# Patient Record
Sex: Female | Born: 1935 | Race: White | Hispanic: No | Smoking: Former smoker
Health system: Southern US, Community
[De-identification: ages and names within clinical notes are randomized; demographics above are authoritative.]

## PROBLEM LIST (undated history)

## (undated) DIAGNOSIS — J45909 Unspecified asthma, uncomplicated: Secondary | ICD-10-CM

## (undated) DIAGNOSIS — K5730 Diverticulosis of large intestine without perforation or abscess without bleeding: Secondary | ICD-10-CM

## (undated) DIAGNOSIS — Z860101 Personal history of adenomatous and serrated colon polyps: Secondary | ICD-10-CM

## (undated) DIAGNOSIS — Z8719 Personal history of other diseases of the digestive system: Secondary | ICD-10-CM

## (undated) DIAGNOSIS — I10 Essential (primary) hypertension: Secondary | ICD-10-CM

## (undated) DIAGNOSIS — E039 Hypothyroidism, unspecified: Secondary | ICD-10-CM

## (undated) DIAGNOSIS — Z86010 Personal history of colonic polyps: Secondary | ICD-10-CM

## (undated) DIAGNOSIS — K31819 Angiodysplasia of stomach and duodenum without bleeding: Secondary | ICD-10-CM

## (undated) DIAGNOSIS — Z9889 Other specified postprocedural states: Secondary | ICD-10-CM

## (undated) DIAGNOSIS — F418 Other specified anxiety disorders: Secondary | ICD-10-CM

## (undated) DIAGNOSIS — E7800 Pure hypercholesterolemia, unspecified: Secondary | ICD-10-CM

## (undated) DIAGNOSIS — N189 Chronic kidney disease, unspecified: Secondary | ICD-10-CM

## (undated) DIAGNOSIS — K2270 Barrett's esophagus without dysplasia: Secondary | ICD-10-CM

## (undated) DIAGNOSIS — J189 Pneumonia, unspecified organism: Secondary | ICD-10-CM

## (undated) DIAGNOSIS — K219 Gastro-esophageal reflux disease without esophagitis: Secondary | ICD-10-CM

## (undated) DIAGNOSIS — K449 Diaphragmatic hernia without obstruction or gangrene: Secondary | ICD-10-CM

## (undated) DIAGNOSIS — K8020 Calculus of gallbladder without cholecystitis without obstruction: Secondary | ICD-10-CM

## (undated) DIAGNOSIS — R627 Adult failure to thrive: Secondary | ICD-10-CM

## (undated) DIAGNOSIS — M1990 Unspecified osteoarthritis, unspecified site: Secondary | ICD-10-CM

## (undated) DIAGNOSIS — J449 Chronic obstructive pulmonary disease, unspecified: Secondary | ICD-10-CM

## (undated) HISTORY — DX: Other specified postprocedural states: Z98.890

## (undated) HISTORY — PX: BLADDER SUSPENSION: SHX72

## (undated) HISTORY — PX: ABDOMINAL HYSTERECTOMY: SHX81

## (undated) HISTORY — DX: Calculus of gallbladder without cholecystitis without obstruction: K80.20

## (undated) HISTORY — PX: CHOLECYSTECTOMY: SHX55

## (undated) HISTORY — PX: APPENDECTOMY: SHX54

## (undated) HISTORY — PX: CATARACT EXTRACTION W/ INTRAOCULAR LENS  IMPLANT, BILATERAL: SHX1307

## (undated) HISTORY — DX: Personal history of other diseases of the digestive system: Z87.19

## (undated) HISTORY — PX: NISSEN FUNDOPLICATION: SHX2091

## (undated) HISTORY — DX: Adult failure to thrive: R62.7

---

## 1987-07-26 HISTORY — PX: OTHER SURGICAL HISTORY: SHX169

## 1994-07-25 DIAGNOSIS — K5730 Diverticulosis of large intestine without perforation or abscess without bleeding: Secondary | ICD-10-CM

## 1994-07-25 HISTORY — DX: Diverticulosis of large intestine without perforation or abscess without bleeding: K57.30

## 1998-04-21 ENCOUNTER — Ambulatory Visit (HOSPITAL_COMMUNITY): Admission: RE | Admit: 1998-04-21 | Discharge: 1998-04-21 | Payer: Self-pay | Admitting: Internal Medicine

## 1998-05-30 ENCOUNTER — Emergency Department (HOSPITAL_COMMUNITY): Admission: EM | Admit: 1998-05-30 | Discharge: 1998-05-30 | Payer: Self-pay | Admitting: Emergency Medicine

## 2000-04-03 ENCOUNTER — Other Ambulatory Visit: Admission: RE | Admit: 2000-04-03 | Discharge: 2000-04-03 | Payer: Self-pay | Admitting: *Deleted

## 2000-04-17 ENCOUNTER — Encounter (INDEPENDENT_AMBULATORY_CARE_PROVIDER_SITE_OTHER): Payer: Self-pay | Admitting: Specialist

## 2000-04-17 ENCOUNTER — Other Ambulatory Visit: Admission: RE | Admit: 2000-04-17 | Discharge: 2000-04-17 | Payer: Self-pay | Admitting: *Deleted

## 2001-07-25 HISTORY — PX: ENTEROCELE REPAIR: SHX623

## 2002-05-06 ENCOUNTER — Inpatient Hospital Stay (HOSPITAL_COMMUNITY): Admission: RE | Admit: 2002-05-06 | Discharge: 2002-05-08 | Payer: Self-pay

## 2002-05-06 ENCOUNTER — Encounter (INDEPENDENT_AMBULATORY_CARE_PROVIDER_SITE_OTHER): Payer: Self-pay | Admitting: Specialist

## 2006-05-03 ENCOUNTER — Ambulatory Visit: Payer: Self-pay | Admitting: Internal Medicine

## 2006-05-22 ENCOUNTER — Ambulatory Visit: Payer: Self-pay | Admitting: Internal Medicine

## 2006-05-22 ENCOUNTER — Encounter (INDEPENDENT_AMBULATORY_CARE_PROVIDER_SITE_OTHER): Payer: Self-pay | Admitting: Specialist

## 2007-08-01 ENCOUNTER — Inpatient Hospital Stay (HOSPITAL_COMMUNITY): Admission: EM | Admit: 2007-08-01 | Discharge: 2007-08-04 | Payer: Self-pay | Admitting: Emergency Medicine

## 2010-12-07 NOTE — Discharge Summary (Signed)
Carolyn [name], Carolyn [name]               ACCOUNT NO.:  [id number]   MEDICAL RECORD NO.:  [id number]          PATIENT TYPE:  INP   LOCATION:  4707                         FACILITY:  [facility]   PHYSICIAN:  Mobolaji B. Bakare, M.D.DATE OF BIRTH:  1931-07-15   DATE OF ADMISSION:  07/31/2007  DATE OF DISCHARGE:  05/03/2008                               DISCHARGE SUMMARY   PRIMARY CARE PHYSICIAN:  [place] with [facility].   FINAL DIAGNOSES:  1. Klebsiella urinary tract infection.  2. Hypotension, felt secondary to dehydration.  3. Presyncope secondary to hypotension.  4. Renal insufficiency secondary to dehydration.  5. Mild normocytic anemia.   PROCEDURES:  1. Head CT scan done on July 31, 2007 showed atrophic microvascular      ischemic changes.  2. Chest x-ray done on July 31, 2007 showed bibasilar interstitial      pulmonary disease and atelectasis.  No definite acute CHF or      pneumonia.   BRIEF HISTORY:  Carolyn [name] is a 77 year old Caucasian female who was  brought to the emergency room after being evaluated at the primary care  physician's office on the day of admission.  She was reportedly to have  felt dizzy and almost passed out at work.  She was sent to the emergency  room for further evaluation.  Upon arrival, blood pressure was 82/59.  She had no chest pain or shortness of breath.  She had a normal sinus  EKG without acute ST changes.  Temperature was normal.  Urinalysis was  suggestive of urinary tract infection.  Patient was admitted for further  evaluation.   HOSPITAL COURSE:  1. Klebsiella urinary tract infection:  Carolyn [name] was empirically      started on ciprofloxacin.  She was hydrated aggressively.  Blood      pressure improved within 24 hours of hospitalization.  She was      previously on Avalide.  This was temporarily held.  Blood pressure      became hypertensive on the day of admission; hence,      antihypertensive was restarted.   Urine culture grew Klebsiella,      which was pan-sensitive except to ampicillin.  Patient has      completed three days of ciprofloxacin.  She will complete four more      days of Cipro as an outpatient.  Blood pressure at the time of      discharge was 153/91.  She will receive her first dose of Avalide      before discharge.   1. Acute renal insufficiency:  Patient was admitted with a BUN and      creatinine of 50/2.5.  She does not have a history of renal      insufficiency.  She was aggressively hydrated with IV fluids.  It      was felt that this is prerenal as a result of poor p.o. intake,      which patient had admitted to, and dehydration on a background of      Avalide.  Renal indices improved gradually.  At the time of      discharge, BUN was 18, creatinine 1.12.  Pre-syncope resolved upon      hydration.   1. Normocytic anemia:  Patient was noted to have a hemoglobin of 10.5      and hematocrit of 31.8 on admission with a normal MVC.  Anemia      panel was within normal limits except for low transferrin      saturation of 13%.  Patient was unable to put the stool for stool      hemoccult.  Will defer further anemia workup to outpatient setting.   DISCHARGE MEDICATIONS:  1. Levaquin 500 mg daily x4 more days.  2. Avalide 300/12.5 mg 1 daily.  3. Synthroid 100 mcg daily.  4. Nexium 40 mg twice daily.  5. Xanax 1 mg nightly.  6. Crestor 10 mg daily.  7. Methocarbamol 375 mg daily.   Patient was previously on clarithromycin for presumed  bronchitis/pneumonia prior to hospitalization.  She had not completed  the dose; however, the chest x-ray done during this hospitalization  showed no pneumonia or bibasilar atelectasis.  Nevertheless, the patient  was discharged home on antibiotic for urinary tract infection, which  would also cover respiratory tract infection (Levaquin).  Hence,  clarithromycin was discontinued.   Follow up with Burnett Kanaris, nurse practitioner,  in one week.   RECOMMENDATIONS:  Outpatient workup for anemia.   PERTINENT LABORATORY DATA:  Iron 43 (normal 42-105), TIBC 3-4, (normal  range 250-470), and percent saturation 13%.  TSH 0.796.  Vitamin B12  446.  Folate 17.6.  Ferritin 38.   DISCHARGE LABORATORY DATA:  Sodium 142, potassium 4.1, chloride 116, CO2  21, glucose 105, BUN 18, creatinine 1.12, calcium 8.9.  Hemoglobin and  hematocrit 9.7/29.      Mobolaji B. Corky Downs, M.D.  Electronically Signed     MBB/MEDQ  D:  08/04/2007  T:  08/04/2007  Job:  [id number]   cc:   Burnett Kanaris, NP

## 2010-12-07 NOTE — H&P (Signed)
NAMESHELONDA, [name] NO.:  [id number]   MEDICAL RECORD NO.:  [id number]          PATIENT TYPE:  INP   LOCATION:  4707                         FACILITY:  [facility]   PHYSICIAN:  Carolyn [name], M.D. DATE OF BIRTH:  14-Jun-1931   DATE OF ADMISSION:  08/01/2007  DATE OF DISCHARGE:                              HISTORY & PHYSICAL   PRIMARY CARE PHYSICIAN:  This is an unassigned patient.   CHIEF COMPLAINT:  Dizziness.   HISTORY OF PRESENT ILLNESS:  This is a 77 year old female who complains  of having dizziness, weakness at work this afternoon and was taken to  the Urgent Care Center by a coworker for further evaluation.  The  patient was sent from the Urgent Care Center at [facility] to the  emergency department secondary to low blood pressure and need for  further evaluation.  The patient was found to have a blood pressure of  90/59 initially and her blood pressure decreased to 82/59.  The patient  was afebrile.  The patient denies having any nausea, vomiting or  diarrhea, any chest pain or shortness of breath.  She does report having  weakness and staggering at one point.  The patient reports falling 2  days ago.  She has been on outpatient antibiotic therapy for acute  bronchitis and has had clarithromycin for 8 days.   PAST MEDICAL HISTORY:  Significant for:  1. Barrett's esophagus.  2. Hypertension.  3. Gastroesophageal reflux disease.  4. Hiatal hernia.  5. The patient also has a history of hypothyroidism.   PAST SURGICAL HISTORY:  1. History of a hiatal hernia repair x2.  2. Appendectomy.  3. Bladder tacking x3.   MEDICATIONS:  Include Avalide, Synthroid, Xanax, Nexium, Crestor and  Robaxin p.r.n.   ALLERGIES:  To MORPHINE, which causes nausea and vomiting, and to  PENICILLIN, which causes hives.   SOCIAL HISTORY:  The patient currently works at [employer].  She is a  nonsmoker, nondrinker.   FAMILY HISTORY:  Positive for hypertension.   REVIEW  OF SYSTEMS:  Pertinents are mentioned above.   PHYSICAL EXAMINATION:  GENERAL:  this is a thin 77 year old, well-  nourished, well-developed female in no discomfort or acute distress  currently.  VITAL SIGNS:  Temperature 97.1, blood pressure 85/60, heart rate 77,  respirations 18 and O2 saturations 100% on room air.  HEENT:  Normocephalic, atraumatic.  Pupils equally round and reactive to light.  Extraocular muscles are intact.  Funduscopic benign.  Oropharynx is  clear.  NECK:  Supple.  Full range of motion.  No thyromegaly, adenopathy or  jugular venous distention.  CARDIOVASCULAR:  Regular rate and rhythm.  No murmurs, gallops or rubs.  LUNGS:  Clear to auscultation bilaterally.  ABDOMEN:  Positive bowel sounds.  Soft, nontender and nondistended.  EXTREMITIES:  Without cyanosis, clubbing or edema.  NEUROLOGIC:  The patient is alert and oriented.  Her speech is clear.  There are no focal deficits.   LABORATORY STUDIES:  White blood cell count 8.3, hemoglobin 10.5,  hematocrit 31.8 and platelets 228,000, neutrophils 54%, lymphocytes 27%.  Sodium 140,  potassium 4.4, chloride 114, bicarb 18.3, BUN 50, creatinine  2.5 and glucose 107.  Urinalysis reveals moderate leukocyte esterase.   EKG reveals a normal sinus rhythm with a rate of 80 and no acute ST-  segment changes.   Chest x-ray was performed revealing bibasilar interstitial prominence  and atelectasis, but no acute CHF or pneumonia.   CT scan of the head reveals atrophy and microvascular ischemic changes,  but no acute intracranial change is present.   ASSESSMENT:  [age]-year-old female being admitted with:  1. Hypotension.  2. Anemia.  3. Chronic renal insufficiency.  4. Dehydration.  5..  Resolving acute bronchitis.   PLAN:  The patient will be admitted and placed on IV fluids and fluid  resuscitation therapy.  Orthostatic vital signs have been ordered.  Her  blood pressure medications will be held for now.   Her electrolytes will  be monitored and replaced as needed.  Her thyroid hormone will also be  checked.  A urine C&S will be ordered and an anemia workup will be  initiated as well.  DVT and GI prophylaxis have been ordered.      Carolyn [name], M.D.  Electronically Signed     HJ/MEDQ  D:  08/01/2007  T:  08/02/2007  Job:  [id number]
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
## 2010-12-10 NOTE — Op Note (Signed)
NAMESHATHA, HOOSER                         ACCOUNT NO.:  [id number]   MEDICAL RECORD NO.:  [id number]                   PATIENT TYPE:  INP   LOCATION:  9306                                 [facility]  [facility]   PHYSICIAN:  Ronda Fairly. Galen Daft, M.D.              DATE OF BIRTH:  02-20-1931   DATE OF PROCEDURE:  05/06/2002  DATE OF DISCHARGE:                                 OPERATIVE REPORT   PREOPERATIVE DIAGNOSIS:  Vaginal vault prolapse.   POSTOPERATIVE DIAGNOSES:  1. Vaginal vault prolapse.  2. Enterocele.   PROCEDURE:  Posterior repair with enterocele resection and sacrospinous  colpopexy.   SURGEON:  Ronda Fairly. Galen Daft, M.D.   ASSISTANT:  Rudy Jew. Ashley Royalty, M.D.   COMPLICATIONS:  None.   ANESTHESIA:  General.   ESTIMATED BLOOD LOSS:  Minimal.   DESCRIPTION OF PROCEDURE:  The patient was identified positively.  We  obtained informed consent.  Preoperative antibiotics, Betadine prep, sterile  technique.  The bladder was catheterized throughout the procedure with a  Foley catheter.  The posterior vaginal wall was entered with a scalpel.  Care was taken to avoid underlying structures.  The vaginal tissue was  reflected away from the rectum posteriorly, and it was noted that at the  vaginal apex there was an enterocele identified.  The enterocele was  resected and the enterocele sac was closed with 2-0 Prolene suture with a  pursestring suture.  There were no complications from this, and the first  count was correct with instrument, sponge, and needle count.  Next it was  noted at the apex as well that there was some vaginal erosion with some  erosion.  A biopsy was performed from this area and sent under separate  specimen.  The posterior repair was completed.  There was a small portion of  vaginal wall that had to be removed to complete this repair.  The total  removed segment was very, very small because of some vaginal compromise to  begin with at the beginning of the  procedure.  The sacrospinous ligament was  identified on the right side, and all other vital structures were separate  and distinct, swept away from there.  The right pararectal space was entered  without difficulty.  There was no active bleeding present.  The sacrospinous  ligament having been identified using straight Briesky retractors, the  rectum was reflected away, as were the anterior vaginal structures.  The  sacrospinous ligament was secured with #1 PDS and #1 Prolene sutures.  The  Prolene was utilized as a Scientist, water quality, and the PDS was  through-and-through.  The procedure was done without trauma.  Avitene was  utilized prophylactically in the space.  The rectum was free of harm  throughout the procedure.  The posterior vaginal wall was then closed  partially with 2-0 Vicryl to the first 2.5-3 cm.  Next the sacrospinous  ligament pulley suture  was secured and the vaginal wall brought  appropriately well into the vaginal cavity and secured very well to the  sacrospinous ligament.  The secondary suture also was secured, and these  were cut to appropriate lengths.  The posterior repair closure was then  continued with another 2-0 Vicryl and there was complete hemostasis noted at  the end of the case.  The vaginal wall was packed  with Estrace-saturated vaginal cream.  There were no complications from the  procedure.  She left the operating room in stable condition.  All  instrument, sponge, and needle counts were correct throughout the case.  No  complications.  Total estimated blood loss less than 100 cc.                                               Ronda Fairly. Galen Daft, M.D.    NJT/MEDQ  D:  05/06/2002  T:  05/06/2002  Job:  [id number]   cc:   Al Decant. Janey Greaser, M.D.  [address]  [place]  [place] [address]  Fax: [phone]   Rudy Jew. Ashley Royalty, M.D.  [address]  [place], [place] [address]  Fax: [phone]

## 2010-12-10 NOTE — Discharge Summary (Signed)
   Carolyn [name], Carolyn [name]                         ACCOUNT NO.:  [id number]   MEDICAL RECORD NO.:  [id number]                   PATIENT TYPE:  INP   LOCATION:  9306                                 [facility]   PHYSICIAN:  Ronda Fairly. Galen Daft, M.D.              DATE OF BIRTH:  1930-11-06   DATE OF ADMISSION:  05/06/2002  DATE OF DISCHARGE:  05/08/2002                                 DISCHARGE SUMMARY   ADMISSION DIAGNOSES:  Vaginal vault prolapse.   PRINCIPAL DIAGNOSES:  Vaginal vault prolapse.   PRINCIPAL PROCEDURES:  Sacrospinous colpopexy with posterior repair and  enterocele resection.   COMPLICATIONS:  None.   CONDITION ON DISCHARGE:  Stable.   FINAL DIAGNOSES:  Vaginal vault prolapse, repaired.   HOSPITAL COURSE:  The patient was admitted on May 06, 2002.  Underwent  surgery without difficulty with a sacrospinous colpopexy and enterocele  repair with posterior repair.  This was done without difficulty.  The  patient did very well.  On the first postoperative day she had no particular  complaints.  There was a little bit of buttock soreness, otherwise doing  well.  Her perineum was negative for any swelling or any active bleeding.  The pack was removed and she was ambulating and voiding without difficulty.  On the second postoperative day her hemoglobin had come back at 10.8 g.  She  was doing very, very well.  Abdomen was soft and benign.  Extremities were  negative.  There was no costovertebral angle tenderness and she was  discharged home with full instructions regarding activity limits, wound  care, follow-up in the office, and medications.  The patient had no  complications from surgery.                                               Ronda Fairly. Galen Daft, M.D.    NJT/MEDQ  D:  06/12/2002  T:  06/12/2002  Job:  [id number]   cc:   Al Decant. Janey Greaser, M.D.  [address]  [place]  [place] [address]  Fax: [phone]

## 2011-01-25 ENCOUNTER — Emergency Department (HOSPITAL_COMMUNITY)
Admission: EM | Admit: 2011-01-25 | Discharge: 2011-01-25 | Disposition: A | Discharge status: Home / Self Care | Payer: BC Managed Care – PPO | Attending: Emergency Medicine | Admitting: Emergency Medicine

## 2011-01-25 ENCOUNTER — Emergency Department (HOSPITAL_COMMUNITY): Payer: BC Managed Care – PPO

## 2011-01-25 DIAGNOSIS — B9789 Other viral agents as the cause of diseases classified elsewhere: Secondary | ICD-10-CM | POA: Insufficient documentation

## 2011-01-25 DIAGNOSIS — R079 Chest pain, unspecified: Secondary | ICD-10-CM | POA: Insufficient documentation

## 2011-01-25 DIAGNOSIS — R9431 Abnormal electrocardiogram [ECG] [EKG]: Secondary | ICD-10-CM | POA: Insufficient documentation

## 2011-01-25 DIAGNOSIS — R509 Fever, unspecified: Secondary | ICD-10-CM | POA: Insufficient documentation

## 2011-01-25 DIAGNOSIS — R5381 Other malaise: Secondary | ICD-10-CM | POA: Insufficient documentation

## 2011-01-25 DIAGNOSIS — K219 Gastro-esophageal reflux disease without esophagitis: Secondary | ICD-10-CM | POA: Insufficient documentation

## 2011-01-25 LAB — CBC
HCT: 37.8 % (ref 36.0–46.0)
Hemoglobin: 13.1 g/dL (ref 12.0–15.0)
MCV: 91.5 fL (ref 78.0–100.0)
RDW: 13.7 % (ref 11.5–15.5)
WBC: 7.2 10*3/uL (ref 4.0–10.5)

## 2011-01-25 LAB — URINALYSIS, ROUTINE W REFLEX MICROSCOPIC
Bilirubin Urine: NEGATIVE
Glucose, UA: NEGATIVE mg/dL
Hgb urine dipstick: NEGATIVE
Specific Gravity, Urine: 1.01 (ref 1.005–1.030)

## 2011-01-25 LAB — BASIC METABOLIC PANEL
Calcium: 9.1 mg/dL (ref 8.4–10.5)
GFR calc Af Amer: >60 mL/min (ref >60)
GFR calc non Af Amer: >60 mL/min (ref >60)
Potassium: 4.5 mEq/L (ref 3.5–5.1)
Sodium: 140 mEq/L (ref 135–145)

## 2011-01-25 LAB — DIFFERENTIAL
Basophils Absolute: 0 10*3/uL (ref 0.0–0.1)
Eosinophils Relative: 1 % (ref 0–5)
Lymphocytes Relative: 20 % (ref 12–46)
Lymphs Abs: 1.4 10*3/uL (ref 0.7–4.0)
Monocytes Absolute: 1 10*3/uL (ref 0.1–1.0)
Neutro Abs: 4.7 10*3/uL (ref 1.7–7.7)

## 2011-01-25 LAB — URINE MICROSCOPIC-ADD ON

## 2011-01-25 LAB — TROPONIN I: Troponin I: <0.3 ng/mL (ref <0.30)

## 2011-04-14 LAB — CBC
Hemoglobin: 10.4 — ABNORMAL LOW
MCHC: 33.4
MCV: 89.1
Platelets: 228
RBC: 3.57 — ABNORMAL LOW
RDW: 14.6
WBC: 8.3

## 2011-04-14 LAB — I-STAT 8, (EC8 V) (CONVERTED LAB)
BUN: 50 — ABNORMAL HIGH
Glucose, Bld: 107 — ABNORMAL HIGH
Hemoglobin: 11.2 — ABNORMAL LOW
Potassium: 4.4
Sodium: 140
pH, Ven: 7.23 — ABNORMAL LOW

## 2011-04-14 LAB — BASIC METABOLIC PANEL
BUN: 42 — ABNORMAL HIGH
CO2: 21
Calcium: 8.5
Calcium: 8.9
Chloride: 116 — ABNORMAL HIGH
GFR calc Af Amer: 58 — ABNORMAL LOW
GFR calc non Af Amer: 28 — ABNORMAL LOW
Glucose, Bld: 88
Potassium: 4
Sodium: 142

## 2011-04-14 LAB — COMPREHENSIVE METABOLIC PANEL
ALT: 22
AST: 35
Alkaline Phosphatase: 108
CO2: 19
Calcium: 8.5
GFR calc non Af Amer: 22 — ABNORMAL LOW
Glucose, Bld: 106 — ABNORMAL HIGH
Potassium: 4.3
Sodium: 138

## 2011-04-14 LAB — RENAL FUNCTION PANEL
CO2: 19
GFR calc Af Amer: >60
GFR calc non Af Amer: 51 — ABNORMAL LOW
Glucose, Bld: 105 — ABNORMAL HIGH
Potassium: 4.2
Sodium: 135

## 2011-04-14 LAB — URINE MICROSCOPIC-ADD ON

## 2011-04-14 LAB — URINALYSIS, ROUTINE W REFLEX MICROSCOPIC
Nitrite: NEGATIVE
Specific Gravity, Urine: 1.016
Urobilinogen, UA: 0.2

## 2011-04-14 LAB — CARDIAC PANEL(CRET KIN+CKTOT+MB+TROPI)
CK, MB: 5 — ABNORMAL HIGH
Relative Index: 3.3 — ABNORMAL HIGH
Total CK: 150
Troponin I: 0.01
Troponin I: 0.02

## 2011-04-14 LAB — FOLATE: Folate: 17.6

## 2011-04-14 LAB — URINE CULTURE

## 2011-04-14 LAB — CK TOTAL AND CKMB (NOT AT ARMC)
CK, MB: 9.3 — ABNORMAL HIGH
Relative Index: 4.3 — ABNORMAL HIGH
Total CK: 216 — ABNORMAL HIGH

## 2011-04-14 LAB — IRON AND TIBC
Saturation Ratios: 13 — ABNORMAL LOW
UIBC: 281

## 2011-04-14 LAB — TSH: TSH: 0.796

## 2011-04-14 LAB — DIFFERENTIAL
Basophils Absolute: 0
Basophils Absolute: 0
Basophils Relative: 1
Eosinophils Absolute: 0.6
Eosinophils Relative: 7 — ABNORMAL HIGH
Eosinophils Relative: 9 — ABNORMAL HIGH
Lymphocytes Relative: 27
Lymphs Abs: 2.2
Monocytes Absolute: 0.8
Monocytes Absolute: 1
Neutro Abs: 3

## 2011-04-14 LAB — FERRITIN: Ferritin: 38 (ref 10–291)

## 2011-07-28 ENCOUNTER — Encounter: Payer: Self-pay | Admitting: Internal Medicine

## 2012-04-18 ENCOUNTER — Encounter: Payer: Self-pay | Admitting: Internal Medicine

## 2013-11-22 DIAGNOSIS — M1990 Unspecified osteoarthritis, unspecified site: Secondary | ICD-10-CM

## 2013-11-22 DIAGNOSIS — J189 Pneumonia, unspecified organism: Secondary | ICD-10-CM

## 2013-11-22 DIAGNOSIS — F418 Other specified anxiety disorders: Secondary | ICD-10-CM

## 2013-11-22 HISTORY — DX: Other specified anxiety disorders: F41.8

## 2013-11-22 HISTORY — DX: Pneumonia, unspecified organism: J18.9

## 2013-11-22 HISTORY — DX: Unspecified osteoarthritis, unspecified site: M19.90

## 2013-11-26 ENCOUNTER — Encounter (HOSPITAL_COMMUNITY): Payer: Self-pay | Admitting: Emergency Medicine

## 2013-11-26 ENCOUNTER — Emergency Department (HOSPITAL_COMMUNITY): Payer: Medicare Other

## 2013-11-26 ENCOUNTER — Inpatient Hospital Stay (HOSPITAL_COMMUNITY)
Admission: EM | Admit: 2013-11-26 | Discharge: 2013-11-28 | DRG: 193 | Disposition: A | Discharge status: Home / Self Care | Payer: Medicare Other | Attending: Internal Medicine | Admitting: Internal Medicine

## 2013-11-26 DIAGNOSIS — M129 Arthropathy, unspecified: Secondary | ICD-10-CM | POA: Diagnosis present

## 2013-11-26 DIAGNOSIS — Z87891 Personal history of nicotine dependence: Secondary | ICD-10-CM

## 2013-11-26 DIAGNOSIS — J449 Chronic obstructive pulmonary disease, unspecified: Secondary | ICD-10-CM | POA: Diagnosis present

## 2013-11-26 DIAGNOSIS — F22 Delusional disorders: Secondary | ICD-10-CM | POA: Diagnosis present

## 2013-11-26 DIAGNOSIS — M1710 Unilateral primary osteoarthritis, unspecified knee: Secondary | ICD-10-CM | POA: Diagnosis present

## 2013-11-26 DIAGNOSIS — I10 Essential (primary) hypertension: Secondary | ICD-10-CM | POA: Diagnosis present

## 2013-11-26 DIAGNOSIS — W19XXXA Unspecified fall, initial encounter: Secondary | ICD-10-CM | POA: Diagnosis present

## 2013-11-26 DIAGNOSIS — R5381 Other malaise: Secondary | ICD-10-CM | POA: Diagnosis present

## 2013-11-26 DIAGNOSIS — J189 Pneumonia, unspecified organism: Principal | ICD-10-CM | POA: Diagnosis present

## 2013-11-26 DIAGNOSIS — J4489 Other specified chronic obstructive pulmonary disease: Secondary | ICD-10-CM | POA: Diagnosis present

## 2013-11-26 DIAGNOSIS — E039 Hypothyroidism, unspecified: Secondary | ICD-10-CM | POA: Diagnosis present

## 2013-11-26 DIAGNOSIS — G9340 Encephalopathy, unspecified: Secondary | ICD-10-CM

## 2013-11-26 DIAGNOSIS — T68XXXA Hypothermia, initial encounter: Secondary | ICD-10-CM | POA: Diagnosis present

## 2013-11-26 DIAGNOSIS — E0590 Thyrotoxicosis, unspecified without thyrotoxic crisis or storm: Secondary | ICD-10-CM | POA: Diagnosis present

## 2013-11-26 DIAGNOSIS — G9341 Metabolic encephalopathy: Secondary | ICD-10-CM | POA: Diagnosis present

## 2013-11-26 DIAGNOSIS — Z9181 History of falling: Secondary | ICD-10-CM

## 2013-11-26 DIAGNOSIS — R531 Weakness: Secondary | ICD-10-CM | POA: Diagnosis present

## 2013-11-26 HISTORY — DX: Chronic obstructive pulmonary disease, unspecified: J44.9

## 2013-11-26 HISTORY — DX: Essential (primary) hypertension: I10

## 2013-11-26 HISTORY — DX: Unspecified asthma, uncomplicated: J45.909

## 2013-11-26 HISTORY — DX: Gastro-esophageal reflux disease without esophagitis: K21.9

## 2013-11-26 HISTORY — DX: Unspecified osteoarthritis, unspecified site: M19.90

## 2013-11-26 HISTORY — DX: Pure hypercholesterolemia, unspecified: E78.00

## 2013-11-26 HISTORY — DX: Hypothyroidism, unspecified: E03.9

## 2013-11-26 LAB — CBC WITH DIFFERENTIAL/PLATELET
BASOS ABS: 0 10*3/uL (ref 0.0–0.1)
Basophils Relative: 0 % (ref 0–1)
EOS ABS: 0 10*3/uL (ref 0.0–0.7)
EOS PCT: 0 % (ref 0–5)
HCT: 36.6 % (ref 36.0–46.0)
Hemoglobin: 11.8 g/dL — ABNORMAL LOW (ref 12.0–15.0)
Lymphocytes Relative: 14 % (ref 12–46)
Lymphs Abs: 1.4 10*3/uL (ref 0.7–4.0)
MCH: 30.5 pg (ref 26.0–34.0)
MCHC: 32.2 g/dL (ref 30.0–36.0)
MCV: 94.6 fL (ref 78.0–100.0)
Monocytes Absolute: 0.6 10*3/uL (ref 0.1–1.0)
Monocytes Relative: 7 % (ref 3–12)
Neutro Abs: 7.7 10*3/uL (ref 1.7–7.7)
Neutrophils Relative %: 79 % — ABNORMAL HIGH (ref 43–77)
PLATELETS: 216 10*3/uL (ref 150–400)
RBC: 3.87 MIL/uL (ref 3.87–5.11)
RDW: 13.2 % (ref 11.5–15.5)
WBC: 9.8 10*3/uL (ref 4.0–10.5)

## 2013-11-26 LAB — COMPREHENSIVE METABOLIC PANEL
ALT: 15 U/L (ref 0–35)
AST: 35 U/L (ref 0–37)
Albumin: 2.9 g/dL — ABNORMAL LOW (ref 3.5–5.2)
Alkaline Phosphatase: 80 U/L (ref 39–117)
BUN: 13 mg/dL (ref 6–23)
CALCIUM: 9.1 mg/dL (ref 8.4–10.5)
CO2: 20 mEq/L (ref 19–32)
Chloride: 100 mEq/L (ref 96–112)
Creatinine, Ser: 0.49 mg/dL — ABNORMAL LOW (ref 0.50–1.10)
GFR calc Af Amer: >90 mL/min (ref >90)
GFR calc non Af Amer: 90 mL/min — ABNORMAL LOW (ref >90)
Glucose, Bld: 78 mg/dL (ref 70–99)
Potassium: 4.3 mEq/L (ref 3.7–5.3)
SODIUM: 137 meq/L (ref 137–147)
TOTAL PROTEIN: 7.4 g/dL (ref 6.0–8.3)
Total Bilirubin: 0.6 mg/dL (ref 0.3–1.2)

## 2013-11-26 LAB — GLUCOSE, CAPILLARY
GLUCOSE-CAPILLARY: 114 mg/dL — AB (ref 70–99)
Glucose-Capillary: 59 mg/dL — ABNORMAL LOW (ref 70–99)
Glucose-Capillary: 90 mg/dL (ref 70–99)

## 2013-11-26 LAB — CK TOTAL AND CKMB (NOT AT ARMC)
CK, MB: 4.2 ng/mL — ABNORMAL HIGH (ref 0.3–4.0)
Relative Index: 2 (ref 0.0–2.5)
Total CK: 206 U/L — ABNORMAL HIGH (ref 7–177)

## 2013-11-26 LAB — CBC
HEMATOCRIT: 37.2 % (ref 36.0–46.0)
Hemoglobin: 12.2 g/dL (ref 12.0–15.0)
MCH: 30.7 pg (ref 26.0–34.0)
MCHC: 32.8 g/dL (ref 30.0–36.0)
MCV: 93.5 fL (ref 78.0–100.0)
Platelets: 252 10*3/uL (ref 150–400)
RBC: 3.98 MIL/uL (ref 3.87–5.11)
RDW: 13.3 % (ref 11.5–15.5)
WBC: 9 10*3/uL (ref 4.0–10.5)

## 2013-11-26 LAB — I-STAT CG4 LACTIC ACID, ED: Lactic Acid, Venous: 1.46 mmol/L (ref 0.5–2.2)

## 2013-11-26 LAB — TSH: TSH: 0.206 u[IU]/mL — ABNORMAL LOW (ref 0.350–4.500)

## 2013-11-26 LAB — URINALYSIS, ROUTINE W REFLEX MICROSCOPIC
Bilirubin Urine: NEGATIVE
GLUCOSE, UA: NEGATIVE mg/dL
HGB URINE DIPSTICK: NEGATIVE
Ketones, ur: 15 mg/dL — AB
Leukocytes, UA: NEGATIVE
Nitrite: NEGATIVE
Protein, ur: NEGATIVE mg/dL
SPECIFIC GRAVITY, URINE: 1.015 (ref 1.005–1.030)
UROBILINOGEN UA: 0.2 mg/dL (ref 0.0–1.0)
pH: 6.5 (ref 5.0–8.0)

## 2013-11-26 LAB — CREATININE, SERUM
Creatinine, Ser: 0.57 mg/dL (ref 0.50–1.10)
GFR calc non Af Amer: 85 mL/min — ABNORMAL LOW (ref >90)

## 2013-11-26 LAB — STREP PNEUMONIAE URINARY ANTIGEN: Strep Pneumo Urinary Antigen: NEGATIVE

## 2013-11-26 MED ORDER — SODIUM CHLORIDE 0.9 % IV SOLN
250.0000 mL | INTRAVENOUS | Status: DC | PRN
  Administered 2013-11-27: 250 mL via INTRAVENOUS

## 2013-11-26 MED ORDER — SODIUM CHLORIDE 0.9 % IJ SOLN
3.0000 mL | Freq: Two times a day (BID) | INTRAMUSCULAR | Status: DC
  Administered 2013-11-26: 3 mL via INTRAVENOUS

## 2013-11-26 MED ORDER — SODIUM CHLORIDE 0.9 % IV BOLUS (SEPSIS)
1000.0000 mL | Freq: Once | INTRAVENOUS | Status: AC
  Administered 2013-11-26: 1000 mL via INTRAVENOUS

## 2013-11-26 MED ORDER — POLYETHYLENE GLYCOL 3350 17 G PO PACK
17.0000 g | PACK | Freq: Every day | ORAL | Status: DC | PRN
  Filled 2013-11-26: qty 1

## 2013-11-26 MED ORDER — ONDANSETRON HCL 4 MG PO TABS: 4.0000 mg | ORAL_TABLET | Freq: Four times a day (QID) | ORAL | Status: DC | PRN

## 2013-11-26 MED ORDER — CEFTRIAXONE SODIUM 2 G IJ SOLR
2.0000 g | Freq: Once | INTRAMUSCULAR | Status: AC
  Administered 2013-11-26: 2 g via INTRAVENOUS
  Filled 2013-11-26: qty 2

## 2013-11-26 MED ORDER — SODIUM CHLORIDE 0.9 % IJ SOLN: 3.0000 mL | INTRAMUSCULAR | Status: DC | PRN

## 2013-11-26 MED ORDER — AZITHROMYCIN 500 MG PO TABS: 500.0000 mg | ORAL_TABLET | Freq: Once | ORAL | Status: DC

## 2013-11-26 MED ORDER — SODIUM CHLORIDE 0.9 % IV SOLN
INTRAVENOUS | Status: AC
  Administered 2013-11-26: 17:00:00 via INTRAVENOUS

## 2013-11-26 MED ORDER — DEXTROSE 5 % IV SOLN
1.0000 g | INTRAVENOUS | Status: DC
  Administered 2013-11-27 – 2013-11-28 (×2): 1 g via INTRAVENOUS
  Filled 2013-11-26 (×3): qty 10

## 2013-11-26 MED ORDER — DEXTROSE 5 % IV SOLN
500.0000 mg | INTRAVENOUS | Status: DC
  Administered 2013-11-27 – 2013-11-28 (×2): 500 mg via INTRAVENOUS
  Filled 2013-11-26 (×3): qty 500

## 2013-11-26 MED ORDER — ACETAMINOPHEN 650 MG RE SUPP: 650.0000 mg | Freq: Four times a day (QID) | RECTAL | Status: DC | PRN

## 2013-11-26 MED ORDER — SODIUM CHLORIDE 0.9 % IV SOLN
INTRAVENOUS | Status: DC
Start: 2013-11-26 — End: 2013-11-26
  Administered 2013-11-26: 125 mL/h via INTRAVENOUS

## 2013-11-26 MED ORDER — ALBUTEROL SULFATE (2.5 MG/3ML) 0.083% IN NEBU
2.5000 mg | INHALATION_SOLUTION | Freq: Four times a day (QID) | RESPIRATORY_TRACT | Status: DC
  Administered 2013-11-26: 2.5 mg via RESPIRATORY_TRACT
  Filled 2013-11-26 (×2): qty 3

## 2013-11-26 MED ORDER — HEPARIN SODIUM (PORCINE) 5000 UNIT/ML IJ SOLN
5000.0000 [IU] | Freq: Three times a day (TID) | INTRAMUSCULAR | Status: DC
  Administered 2013-11-26 – 2013-11-28 (×5): 5000 [IU] via SUBCUTANEOUS
  Filled 2013-11-26 (×9): qty 1

## 2013-11-26 MED ORDER — PNEUMOCOCCAL VAC POLYVALENT 25 MCG/0.5ML IJ INJ
0.5000 mL | INJECTION | INTRAMUSCULAR | Status: AC
  Administered 2013-11-27: 0.5 mL via INTRAMUSCULAR
  Filled 2013-11-26: qty 0.5

## 2013-11-26 MED ORDER — INSULIN ASPART 100 UNIT/ML ~~LOC~~ SOLN
0.0000 [IU] | Freq: Three times a day (TID) | SUBCUTANEOUS | Status: DC
Start: 2013-11-26 — End: 2013-11-28
  Administered 2013-11-27 – 2013-11-28 (×2): 1 [IU] via SUBCUTANEOUS

## 2013-11-26 MED ORDER — ACETAMINOPHEN 325 MG PO TABS
650.0000 mg | ORAL_TABLET | Freq: Four times a day (QID) | ORAL | Status: DC | PRN
  Administered 2013-11-27: 650 mg via ORAL
  Filled 2013-11-26: qty 2

## 2013-11-26 MED ORDER — ONDANSETRON HCL 4 MG/2ML IJ SOLN: 4.0000 mg | Freq: Four times a day (QID) | INTRAMUSCULAR | Status: DC | PRN

## 2013-11-26 NOTE — Progress Notes (Signed)
Received pt to 5w35 from ED at this time, daughter at bedside, instructed pt on usage of call light, bed alarm placed on.
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
## 2013-11-26 NOTE — H&P (Signed)
[facility] History and Physical  Carolyn [name]:[id number] DOB: Aug 28, 1930 DOA: 11/26/2013  Referring physician: Dr. Eulis Foster PCP: Pcp Not In System   Chief Complaint: confusion  HPI: Carolyn [name] is a 80 y.o. female  The past medical history of hypertension that comes in for confusion and fall, her daughter I spoke to her 12 hours before seeing her today. When she went to the house today she was on the floor was not able to get up. She relates it was due to the pain. She has been complaining of being weak and affect. Not wanting to ambulate. She denies any cough, fever, shortness of breath nausea or vomiting.  In the ED: She was found to be hypothermic, chest x-ray was done that shows left lower lobe pneumonia, consulted for further evaluation. She was treated with 2 L of normal saline bolus in the emergency room. A Foley was placed.  Review of Systems:  Constitutional:  No weight loss, night sweats, Fevers, chills, fatigue.  HEENT:  No headaches, Difficulty swallowing,Tooth/dental problems,Sore throat,  No sneezing, itching, ear ache, nasal congestion, post nasal drip,  Cardio-vascular:  No chest pain, Orthopnea, PND, swelling in lower extremities, anasarca, dizziness, palpitations  GI:  No heartburn, indigestion, abdominal pain, nausea, vomiting, diarrhea, change in bowel habits, loss of appetite  Resp:  No shortness of breath with exertion or at rest. No excess mucus, no productive cough, No non-productive cough, No coughing up of blood.No change in color of mucus.No wheezing.No chest wall deformity  Skin:  no rash or lesions.  GU:  no dysuria, change in color of urine, no urgency or frequency. No flank pain.  Musculoskeletal:  No joint pain or swelling. No decreased range of motion. No back pain.  Psych:  No change in mood or affect. No depression or anxiety. No memory loss.   Past Medical History  Diagnosis Date  . Hypertension   . Arthritis    Past  Surgical History  Procedure Laterality Date  . Abdominal hysterectomy    . Hernia repair    . Cholecystectomy    . Bladder tack     Social History:  reports that she has quit smoking. She does not have any smokeless tobacco history on file. She reports that she does not drink alcohol or use illicit drugs.  Allergies  Allergen Reactions  . Penicillins     History reviewed. No pertinent family history.   Prior to Admission medications   Not on File   Physical Exam: Filed Vitals:   11/26/13 1330  BP: 153/78  Pulse: 73  Temp: 98.2 F (36.8 C)  Resp: 14    BP 153/78  Pulse 73  Temp(Src) 98.2 F (36.8 C) (Core (Comment))  Resp 14  Wt 68.04 kg (150 lb)  SpO2 97%  General:  Appears calm and comfortable Eyes: PERRL, normal lids, irises & conjunctiva ENT: grossly normal hearing, lips & tongue Neck: no LAD, masses or thyromegaly Cardiovascular: RRR, no m/r/g. No LE edema. Respiratory: Good air movement with crackles in the last Abdomen: soft, ntnd Skin: no rash or induration seen on limited exam Musculoskeletal: grossly normal tone BUE/BLE Psychiatric: grossly normal mood and affect, speech fluent and appropriate Neurologic: grossly non-focal.          Labs on Admission:  Basic Metabolic Panel:  Recent Labs Lab 11/26/13 1248  NA 137  K 4.3  CL 100  CO2 20  GLUCOSE 78  BUN 13  CREATININE 0.49*  CALCIUM 9.1  Liver Function Tests:  Recent Labs Lab 11/26/13 1248  AST 35  ALT 15  ALKPHOS 80  BILITOT 0.6  PROT 7.4  ALBUMIN 2.9*   No results found for this basename: LIPASE, AMYLASE,  in the last 168 hours No results found for this basename: AMMONIA,  in the last 168 hours CBC:  Recent Labs Lab 11/26/13 1248  WBC 9.8  NEUTROABS 7.7  HGB 11.8*  HCT 36.6  MCV 94.6  PLT 216   Cardiac Enzymes:  Recent Labs Lab 11/26/13 1248  CKTOTAL 206*  CKMB 4.2*    BNP (last 3 results) No results found for this basename: PROBNP,  in the last 8760  hours CBG: No results found for this basename: GLUCAP,  in the last 168 hours  Radiological Exams on Admission: Dg Chest Port 1 View  11/26/2013   CLINICAL DATA:  Fall, weakness.  EXAM: PORTABLE CHEST - 1 VIEW  COMPARISON:  01/25/2011  FINDINGS: Increasing density noted at the left lung base concerning for pneumonia. Mild hyperinflation of the lungs. Heart is normal size. Right lung is clear. No effusions. No acute bony abnormality.  IMPRESSION: Increasing patchy density at the left lung base concerning for pneumonia.  COPD.   Electronically Signed   By: Rolm Baptise M.D.   On: 11/26/2013 12:36    EKG: Independently reviewed.   Assessment/Plan Acute metabolic encephalopathy: - She is nonfebrile and physical exam, this most probably due to infectious etiology. - Reevaluate in the morning. We'll use however when necessary for agitation overnight. - pharmacy to perform med rec.  Hypothermia due to community acquired pneumonia: - I agree with Rocephin and azithromycin, and get sputum cultures. - She's currently not needing a physical exam so we'll not start Solu-Medrol, will continue albuterol when necessary. - With physical therapy to evaluate her.     Code Status: presumed full Family Communication: none Disposition Plan: none  Time spent: 80 minutes  [facility] Pager [phone]

## 2013-11-26 NOTE — ED Provider Notes (Signed)
CSN: [id number]     Arrival date & time 11/26/13  1106 History   First MD Initiated Contact with Patient 11/26/13 1108     Chief Complaint  Patient presents with  . Code Sepsis  . Weakness  . Cold Exposure     (Consider location/radiation/quality/duration/timing/severity/associated sxs/prior Treatment) Patient is a 80 y.o. female presenting with weakness. The history is provided by the patient and a relative.  Weakness   She is here for evaluation after a fall, last night, and being unable to get up afterwards. There was no injury in the fall. She was found about 12 hours later, by her son. She states that she fell because of pain in her left knee, which caused her to fall. She states that when she walks she gets cramps in her kneecaps and this causes her to fall. She's had several falls, both inside and outside of her home, in the last several days. She occasionally uses a cane or walker to help with ambulation, but does not always use them. She'll urinary tract infection. That was treated with an antibiotic about 7 weeks ago. But no other recent illnesses. She denies problems eating, but her daughter, states that she does not eat well. No recent cough. No recent changes in bowel or urinary habits. She denies significant injury after falling. She has not taken any of her medications, since yesterday. There are no other known modifying factors.    Past Medical History  Diagnosis Date  . Hypertension   . Arthritis    Past Surgical History  Procedure Laterality Date  . Abdominal hysterectomy    . Hernia repair    . Cholecystectomy    . Bladder tack     History reviewed. No pertinent family history. History  Substance Use Topics  . Smoking status: Former Research scientist (life sciences)  . Smokeless tobacco: Not on file  . Alcohol Use: No   OB History   Grav Para Term Preterm Abortions TAB SAB Ect Mult Living                 Review of Systems  Neurological: Positive for weakness.  All other systems  reviewed and are negative.     Allergies  Penicillins  Home Medications   Prior to Admission medications   Not on File   BP 140/71  Pulse 83  Temp(Src) 99.7 F (37.6 C) (Oral)  Resp 13  Wt 150 lb (68.04 kg)  SpO2 96% Physical Exam  Nursing note and vitals reviewed. Constitutional: She is oriented to person, place, and time. She appears well-developed.  Elderly frail  HENT:  Head: Normocephalic and atraumatic.  Mucous membranes are moist  Eyes: Conjunctivae and EOM are normal. Pupils are equal, round, and reactive to light.  Neck: Normal range of motion and phonation normal. Neck supple.  Cardiovascular: Normal rate, regular rhythm and intact distal pulses.   Pulmonary/Chest: Effort normal. She exhibits no tenderness.  Mildly decreased breath sounds at bases, bilaterally. No rales, rhonchi  Abdominal: Soft. She exhibits no distension. There is no tenderness. There is no guarding.  Musculoskeletal: Normal range of motion. She exhibits no edema.  Left knee tenderness with swelling, indicative of chronic osteoarthritis and pain with passive range of motion. No other large joint deformity.  Neurological: She is alert and oriented to person, place, and time. She exhibits normal muscle tone.  Skin: Skin is warm and dry.  Psychiatric: She has a normal mood and affect. Her behavior is normal. Thought content normal.  ED Course  Procedures (including critical care time) Labs Review Labs Reviewed  CBC WITH DIFFERENTIAL - Abnormal; Notable for the following:    Hemoglobin 11.8 (*)    Neutrophils Relative % 79 (*)    All other components within normal limits  COMPREHENSIVE METABOLIC PANEL - Abnormal; Notable for the following:    Creatinine, Ser 0.49 (*)    Albumin 2.9 (*)    GFR calc non Af Amer 90 (*)    All other components within normal limits  URINALYSIS, ROUTINE W REFLEX MICROSCOPIC - Abnormal; Notable for the following:    Ketones, ur 15 (*)    All other components  within normal limits  CK TOTAL AND CKMB - Abnormal; Notable for the following:    Total CK 206 (*)    CK, MB 4.2 (*)    All other components within normal limits  CULTURE, BLOOD (ROUTINE X 2)  CULTURE, BLOOD (ROUTINE X 2)  URINE CULTURE  I-STAT CG4 LACTIC ACID, ED    Imaging Review Dg Chest Port 1 View  11/26/2013   CLINICAL DATA:  Fall, weakness.  EXAM: PORTABLE CHEST - 1 VIEW  COMPARISON:  01/25/2011  FINDINGS: Increasing density noted at the left lung base concerning for pneumonia. Mild hyperinflation of the lungs. Heart is normal size. Right lung is clear. No effusions. No acute bony abnormality.  IMPRESSION: Increasing patchy density at the left lung base concerning for pneumonia.  COPD.   Electronically Signed   By: Rolm Baptise M.D.   On: 11/26/2013 12:36     EKG Interpretation   Date/Time:  Tuesday Nov 26 2013 11:31:27 EDT Ventricular Rate:  64 PR Interval:  179 QRS Duration: 92 QT Interval:  437 QTC Calculation: 451 R Axis:   73 Text Interpretation:  Sinus rhythm Artifact Probable left atrial  enlargement Anteroseptal infarct, age indeterminate since last tracing no  significant change Confirmed by Eulis Foster  MD, Marwa Fuhrman [phone]) on 11/26/2013  12:50:34 PM      MDM   Final diagnoses:  Fall  Community acquired pneumonia  Hypothermia    Nonspecific weakness, contributing to multiple falls and evident debilitated state. Evaluation here today is consistent with possible early community-acquired pneumonia. No evidence for urinary tract infection. No evidence for severe sepsis, metabolic instability, or suspected impending vascular collapse.  Nursing Notes Reviewed/ Care Coordinated, and agree without changes. Applicable Imaging Reviewed.  Interpretation of Laboratory Data incorporated into ED treatment  Plan: Admit  Richarda Blade, MD 11/26/13 1556

## 2013-11-26 NOTE — Progress Notes (Signed)
Received reportfrom Delia Chimes in ED at this time

## 2013-11-26 NOTE — ED Notes (Signed)
Report given to Patty, RN on [facility]--- pt will go to [facility].

## 2013-11-27 ENCOUNTER — Encounter (HOSPITAL_COMMUNITY): Payer: Self-pay | Admitting: Student

## 2013-11-27 DIAGNOSIS — G9340 Encephalopathy, unspecified: Secondary | ICD-10-CM | POA: Diagnosis present

## 2013-11-27 DIAGNOSIS — J189 Pneumonia, unspecified organism: Secondary | ICD-10-CM | POA: Diagnosis present

## 2013-11-27 DIAGNOSIS — E0590 Thyrotoxicosis, unspecified without thyrotoxic crisis or storm: Secondary | ICD-10-CM

## 2013-11-27 DIAGNOSIS — R531 Weakness: Secondary | ICD-10-CM | POA: Diagnosis present

## 2013-11-27 DIAGNOSIS — R5383 Other fatigue: Secondary | ICD-10-CM

## 2013-11-27 DIAGNOSIS — R5381 Other malaise: Secondary | ICD-10-CM

## 2013-11-27 LAB — T4, FREE: FREE T4: 1.4 ng/dL (ref 0.80–1.80)

## 2013-11-27 LAB — COMPREHENSIVE METABOLIC PANEL
ALBUMIN: 2.4 g/dL — AB (ref 3.5–5.2)
ALK PHOS: 99 U/L (ref 39–117)
ALT: 12 U/L (ref 0–35)
AST: 25 U/L (ref 0–37)
BUN: 13 mg/dL (ref 6–23)
CHLORIDE: 106 meq/L (ref 96–112)
CO2: 22 mEq/L (ref 19–32)
CREATININE: 0.63 mg/dL (ref 0.50–1.10)
Calcium: 8.6 mg/dL (ref 8.4–10.5)
GFR calc Af Amer: >90 mL/min (ref >90)
GFR calc non Af Amer: 82 mL/min — ABNORMAL LOW (ref >90)
Glucose, Bld: 90 mg/dL (ref 70–99)
POTASSIUM: 3.4 meq/L — AB (ref 3.7–5.3)
Sodium: 143 mEq/L (ref 137–147)
TOTAL PROTEIN: 6.5 g/dL (ref 6.0–8.3)
Total Bilirubin: 0.3 mg/dL (ref 0.3–1.2)

## 2013-11-27 LAB — HIV ANTIBODY (ROUTINE TESTING W REFLEX): HIV 1&2 Ab, 4th Generation: NONREACTIVE

## 2013-11-27 LAB — CBC
HCT: 34.5 % — ABNORMAL LOW (ref 36.0–46.0)
Hemoglobin: 11.3 g/dL — ABNORMAL LOW (ref 12.0–15.0)
MCH: 30.5 pg (ref 26.0–34.0)
MCHC: 32.8 g/dL (ref 30.0–36.0)
MCV: 93.2 fL (ref 78.0–100.0)
Platelets: 237 10*3/uL (ref 150–400)
RBC: 3.7 MIL/uL — AB (ref 3.87–5.11)
RDW: 13.4 % (ref 11.5–15.5)
WBC: 7.1 10*3/uL (ref 4.0–10.5)

## 2013-11-27 LAB — URINE CULTURE
COLONY COUNT: NO GROWTH
Culture: NO GROWTH

## 2013-11-27 LAB — GLUCOSE, CAPILLARY
Glucose-Capillary: 142 mg/dL — ABNORMAL HIGH (ref 70–99)
Glucose-Capillary: 101 mg/dL — ABNORMAL HIGH (ref 70–99)
Glucose-Capillary: 124 mg/dL — ABNORMAL HIGH (ref 70–99)

## 2013-11-27 LAB — LEGIONELLA ANTIGEN, URINE: Legionella Antigen, Urine: NEGATIVE

## 2013-11-27 LAB — URIC ACID: Uric Acid, Serum: 5.2 mg/dL (ref 2.4–7.0)

## 2013-11-27 MED ORDER — POTASSIUM CHLORIDE CRYS ER 20 MEQ PO TBCR
40.0000 meq | EXTENDED_RELEASE_TABLET | Freq: Once | ORAL | Status: AC
  Administered 2013-11-27: 40 meq via ORAL
  Filled 2013-11-27: qty 2

## 2013-11-27 MED ORDER — LEVOTHYROXINE SODIUM 100 MCG PO TABS
100.0000 ug | ORAL_TABLET | Freq: Every day | ORAL | Status: DC
  Administered 2013-11-27: 100 ug via ORAL
  Filled 2013-11-27 (×2): qty 1

## 2013-11-27 MED ORDER — HYDRALAZINE HCL 20 MG/ML IJ SOLN
5.0000 mg | Freq: Four times a day (QID) | INTRAMUSCULAR | Status: DC | PRN
  Administered 2013-11-27: 5 mg via INTRAVENOUS
  Filled 2013-11-27: qty 1

## 2013-11-27 MED ORDER — LEVOTHYROXINE SODIUM 50 MCG PO TABS
50.0000 ug | ORAL_TABLET | Freq: Every day | ORAL | Status: DC
  Administered 2013-11-28: 50 ug via ORAL
  Filled 2013-11-27 (×2): qty 1

## 2013-11-27 MED ORDER — NAPROXEN 500 MG PO TABS
500.0000 mg | ORAL_TABLET | Freq: Two times a day (BID) | ORAL | Status: DC
  Administered 2013-11-27 – 2013-11-28 (×2): 500 mg via ORAL
  Filled 2013-11-27 (×6): qty 1

## 2013-11-27 MED ORDER — ALBUTEROL SULFATE (2.5 MG/3ML) 0.083% IN NEBU
2.5000 mg | INHALATION_SOLUTION | Freq: Three times a day (TID) | RESPIRATORY_TRACT | Status: DC
  Administered 2013-11-27 – 2013-11-28 (×4): 2.5 mg via RESPIRATORY_TRACT
  Filled 2013-11-27 (×5): qty 3

## 2013-11-27 MED ORDER — METOPROLOL SUCCINATE ER 50 MG PO TB24
50.0000 mg | ORAL_TABLET | Freq: Every day | ORAL | Status: DC
  Administered 2013-11-27 – 2013-11-28 (×2): 50 mg via ORAL
  Filled 2013-11-27 (×2): qty 1

## 2013-11-27 NOTE — Clinical Social Work Psychosocial (Signed)
Clinical Social Work Department BRIEF PSYCHOSOCIAL ASSESSMENT 11/27/2013  Patient:  Carolyn [name], Carolyn [name]     Account Number:  [id number]     Admit date:  11/26/2013  Clinical Social Worker:  Lovey Newcomer  Date/Time:  11/27/2013 11:15 AM  Referred by:  Physician  Date Referred:  11/27/2013 Referred for  SNF Placement   Other Referral:   Interview type:  Patient Other interview type:   Patient alert and oriented at time of assessment.    PSYCHOSOCIAL DATA Living Status:  ALONE Admitted from facility:   Level of care:   Primary support name:  Remo Lipps Primary support relationship to patient:  CHILD, ADULT Degree of support available:   Support is good.    CURRENT CONCERNS Current Concerns  Post-Acute Placement   Other Concerns:    SOCIAL WORK ASSESSMENT / PLAN CSW met with patient to complete assessment. Patient is stating that she does not want to go to a SNF and would like to return home. CSW inquired about patient's reasons for refusing SNF. Patient states that she does not think she needs SNF placement and can manage at home. CSW brought up patient's recent falls and patient acknowledges that she does have frequent falls at home. Patient feels that she is losing her independence and states that she worked at [employer] for 55 years and ever since retirning she feels like she has declined. CSW offered emotional support to patient and explained that she does not have to make the decision about SNF right now but CSW could make referral to SNFs in [facility] and give offers to patient so that she will have SNF options if she changes her mind. Patient states that she is agreeable to this. CSW explained SNF search/placement process to patient and answered patient's questions. Patient states that she is from home alone but her "daughter and son live close by."   Assessment/plan status:  Psychosocial Support/Ongoing Assessment of Needs Other assessment/ plan:   Complete  FL2, Fax, PASRR   Information/referral to community resources:   CSW contact information and SNF list given to patient'    PATIENT'S/FAMILY'S RESPONSE TO PLAN OF CARE: Patient is apprehensive about SNF placement and prefers to return home at discharge. Patient states that she is agreeable to a SNF search but unsure if she will actually go to SNF. Patient was pleasant, approriate, and appreciative of CSW contact. CSW will assist with DC if appropriate.       Liz Beach MSW, [place], [place], [phone]

## 2013-11-27 NOTE — Progress Notes (Signed)
Utilization review completed.  

## 2013-11-27 NOTE — Clinical Social Work Placement (Signed)
Clinical Social Work Department CLINICAL SOCIAL WORK PLACEMENT NOTE 11/27/2013  Patient:  Carolyn [name], Carolyn [name]  Account Number:  [id number] Admit date:  11/26/2013  Clinical Social Worker:  Lovey Newcomer  Date/time:  11/27/2013 11:45 AM  Clinical Social Work is seeking post-discharge placement for this patient at the following level of care:   SKILLED NURSING   (*CSW will update this form in Epic as items are completed)   11/27/2013  Patient/family provided with [facility] Department of Clinical Social Work's list of facilities offering this level of care within the geographic area requested by the patient (or if unable, by the patient's family).  11/27/2013  Patient/family informed of their freedom to choose among providers that offer the needed level of care, that participate in Medicare, Medicaid or managed care program needed by the patient, have an available bed and are willing to accept the patient.  11/27/2013  Patient/family informed of [facility]' ownership interest in [facility], as well as of the fact that they are under no obligation to receive care at this facility.  PASARR submitted to EDS on 11/27/2013 PASARR number received from EDS on 11/27/2013  FL2 transmitted to all facilities in geographic area requested by pt/family on  11/27/2013 FL2 transmitted to all facilities within larger geographic area on   Patient informed that his/her managed care company has contracts with or will negotiate with  certain facilities, including the following:     Patient/family informed of bed offers received:   Patient chooses bed at  Physician recommends and patient chooses bed at    Patient to be transferred to  on   Patient to be transferred to facility by   The following physician request were entered in Epic:   Additional Comments:    Liz Beach MSW, [place], [place], [phone]

## 2013-11-27 NOTE — Progress Notes (Signed)
PROGRESS NOTE  Carolyn [name] QQI:[id number] DOB: [birth year] DOA: 11/26/2013 PCP: Pcp Not In System  HPI 80 y/o F with PMH of HTN, hypothyroidism, Asthma,  Arthritis, etc was brought into the ED via EMS on 5/5 for hypothermia and AMS due to a fall the night before and extended time down on linoleum prior to being found, ~12hr.  Family reported she also fell on Monday and was in an empty tub for 4 hr. Pt states she gets dizzy before she falls, but doesn't actually pass out. And that pain in her L knee causes the fall. She has a walker/cane at home but admits to not always using it. Family reports not eating well recently but pt denies any issues.   In the ED: She was found to be hypothermic(96.7 rectal), chest x-ray was done that shows left lower lobe pneumonia, consulted for further evaluation. She was treated with 2 L of normal saline bolus in the emergency room. A Foley was placed.  Subjective: Pt is awake and conversant, answers direct questions appropriately but during other patient encounters she was voicing delusions about hooded pastors and pictures of caged people.  Assessment/Plan: Hypothermia Resolved, due to extended stay on linoleum overnight from fall and PNA Further work up for fall and infection warranted Family has already ordered a fall alert system to decrease/prevent future episodes  CAP (community acquired pneumonia) CXR finding of LLL increased patchy densities and COPD Blood and sputum cultures pending Azithromycin and Rocephin, albuterol PRN  Thyrotoxicosis Hx of hypothyroidism with thyroid hormone replacement, tachycardic, L hand tremor, general weakness TSH 0.206 Check Free T4 Decrease synthroid dose to 50 mcg.  Weakness/Falls Multifactorial: thyrotoxicosis, knee pain, age related No rhabdo. X-ray found OA in L knee,  Correct/treat findings and reevaluate PT to see her, family ordered fall alert system  Acute encephalopathy Pt answers direct questions  but then describes delusions (people with black hoods in church),  Secondary to infection and TSH levels Monitor for resolution with treatment of confounding issues, may need further cognitive work up  HTN -continue with Metoprolol   DVT Prophylaxis: Heparin  Code Status: FULL Family Communication: None at bedside Disposition Plan: Inpatient   Consultants:  PT  Antibiotics:  Azithromycin   Rocephin  Objective: Filed Vitals:   11/27/13 0438 11/27/13 0500 11/27/13 0802 11/27/13 0904  BP: 170/85   151/77  Pulse: 80   114  Temp: 97.8 F (36.6 C)     TempSrc: Oral     Resp: 18     Height:      Weight:  68.4 kg (150 lb 12.7 oz)    SpO2: 94%  93%     Intake/Output Summary (Last 24 hours) at 11/27/13 1246 Last data filed at 11/27/13 0800  Gross per 24 hour  Intake 3833.66 ml  Output   1950 ml  Net 1883.66 ml   Filed Weights   11/26/13 1150 11/26/13 1614 11/27/13 0500  Weight: 68.04 kg (150 lb) 67.495 kg (148 lb 12.8 oz) 68.4 kg (150 lb 12.7 oz)    Exam: General: NAD, appears stated age/elderly  HEENT:  PERR, Anicteic Sclera, dry MM.  Neck: Supple, no JVD, no masses  Cardiovascular: tachycardic, parasternal heaves noted to chest wall S1 S2 auscultated, no rubs, murmurs or gallops.   Respiratory: equal chest rise, rhonchi noted in the left base  Abdomen: Soft, nontender, nondistended, + bowel sounds  Extremities: warm dry without cyanosis clubbing or edema.  Erythema and swelling noted in DIP of  right hand.  Finger is Tender to palpation on the dorsal side. Full sensation.  Unable to flex completely. Neuro: AOx3, cranial nerves intact. Some delusion/dementia noted in speech content Skin: Without rashes exudates or nodules.     Data Reviewed: Basic Metabolic Panel:  Recent Labs Lab 11/26/13 1248 11/26/13 2020 11/27/13 0412  NA 137  --  143  K 4.3  --  3.4*  CL 100  --  106  CO2 20  --  22  GLUCOSE 78  --  90  BUN 13  --  13  CREATININE 0.49* 0.57 0.63   CALCIUM 9.1  --  8.6   Liver Function Tests:  Recent Labs Lab 11/26/13 1248 11/27/13 0412  AST 35 25  ALT 15 12  ALKPHOS 80 99  BILITOT 0.6 0.3  PROT 7.4 6.5  ALBUMIN 2.9* 2.4*   CBC:  Recent Labs Lab 11/26/13 1248 11/26/13 2020 11/27/13 0412  WBC 9.8 9.0 7.1  NEUTROABS 7.7  --   --   HGB 11.8* 12.2 11.3*  HCT 36.6 37.2 34.5*  MCV 94.6 93.5 93.2  PLT 216 252 237   Cardiac Enzymes:  Recent Labs Lab 11/26/13 1248  CKTOTAL 206*  CKMB 4.2*   CBG:  Recent Labs Lab 11/26/13 1725 11/26/13 1812 11/26/13 2206  GLUCAP 59* 90 114*    Recent Results (from the past 240 hour(s))  URINE CULTURE     Status: None   Collection Time    11/26/13 12:21 PM      Result Value Ref Range Status   Specimen Description URINE, CATHETERIZED   Final   Special Requests NONE   Final   Culture  Setup Time     Final   Value: 11/26/2013 12:47     Performed at [place]     Final   Value: NO GROWTH     Performed at [facility]   Culture     Final   Value: NO GROWTH     Performed at [facility]   Report Status 11/27/2013 FINAL   Final  CULTURE, BLOOD (ROUTINE X 2)     Status: None   Collection Time    11/26/13 12:48 PM      Result Value Ref Range Status   Specimen Description BLOOD LEFT ANTECUBITAL   Final   Special Requests BOTTLES DRAWN AEROBIC AND ANAEROBIC 5CC   Final   Culture  Setup Time     Final   Value: 11/26/2013 16:51     Performed at [facility]   Culture     Final   Value:        BLOOD CULTURE RECEIVED NO GROWTH TO DATE CULTURE WILL BE HELD FOR 5 DAYS BEFORE ISSUING A FINAL NEGATIVE REPORT     Performed at [facility]   Report Status PENDING   Incomplete  CULTURE, BLOOD (ROUTINE X 2)     Status: None   Collection Time    11/26/13  1:50 PM      Result Value Ref Range Status   Specimen Description BLOOD RIGHT ANTECUBITAL   Final   Special Requests BOTTLES DRAWN AEROBIC AND ANAEROBIC 10CC   Final    Culture  Setup Time     Final   Value: 11/26/2013 16:52     Performed at [facility]   Culture     Final   Value:        BLOOD CULTURE  RECEIVED NO GROWTH TO DATE CULTURE WILL BE HELD FOR 5 DAYS BEFORE ISSUING A FINAL NEGATIVE REPORT     Performed at [facility]   Report Status PENDING   Incomplete     Studies: Dg Chest Port 1 View  11/26/2013   CLINICAL DATA:  Fall, weakness.  EXAM: PORTABLE CHEST - 1 VIEW  COMPARISON:  01/25/2011  FINDINGS: Increasing density noted at the left lung base concerning for pneumonia. Mild hyperinflation of the lungs. Heart is normal size. Right lung is clear. No effusions. No acute bony abnormality.  IMPRESSION: Increasing patchy density at the left lung base concerning for pneumonia.  COPD.   Electronically Signed   By: Rolm Baptise M.D.   On: 11/26/2013 12:36    Scheduled Meds: . albuterol  2.5 mg Nebulization TID  . azithromycin  500 mg Intravenous Q24H  . cefTRIAXone (ROCEPHIN)  IV  1 g Intravenous Q24H  . heparin  5,000 Units Subcutaneous 3 times per day  . insulin aspart  0-9 Units Subcutaneous TID WC  . [START ON 11/28/2013] levothyroxine  50 mcg Oral QAC breakfast  . metoprolol succinate  50 mg Oral Daily  . naproxen  500 mg Oral BID WC  . pneumococcal 23 valent vaccine  0.5 mL Intramuscular Tomorrow-1000  . sodium chloride  3 mL Intravenous Q12H   Continuous Infusions:   Principal Problem:   Hypothermia Active Problems:   CAP (community acquired pneumonia)   Weakness   Acute encephalopathy   Acute thyrotoxicosis    Myrla Halsted PA-S Imogene Burn, PA-C [facility] Pager [phone]. If 7PM-7AM, please contact night-coverage at [email or web address], password [facility] 11/27/2013, 12:46 PM  LOS: 1 day   Attending Patient seen and examined, agree with the assessment and plan as outlined above. Hypothermia resolved,likely secondary to PNA/Lying Linolieum -has resolved. Need PT eval, since afebrile/leukocytosis-suspect home in 1-2  days-if clinical improvement continues.  Nena Alexander MD

## 2013-11-27 NOTE — Evaluation (Signed)
Occupational Therapy Evaluation Patient Details Name: Carolyn [name] MRN: [id number] DOB: Jul 24, 1931 Today's Date: 11/27/2013    History of Present Illness Carolyn [name] is a 80 y.o. female  past medical history of hypertension that comes in for confusion and fall, her daughter I spoke to her 12 hours before seeing her today. When she went to the house today she was on the floor was not able to get up. She relates it was due to the pain. She has been complaining of being weak and not wanting to ambulate. She denies any cough, fever, shortness of breath nausea or vomiting. Chest xray showed L lower lobe PNA   Clinical Impression   Pt demonstrates decline in function and safety with ADLs and ADL mobility with decreased strength, balance and endurance. Pt would benefit from acute OT services to address impairments to increase level of function and safety. Discussed with pt and her son at length safest options for d/c after acute care stay. Pt is adamant about returning home although she would e alone and uncertain if she can have 24 hr sup from her family along with [facility]    Follow Up Recommendations  SNF;Supervision/Assistance - 24 hour    Equipment Recommendations  Tub/shower seat;Tub/shower bench;3 in 1 bedside comode    Recommendations for Other Services       Precautions / Restrictions Precautions Precautions: Fall Restrictions Weight Bearing Restrictions: No      Mobility Bed Mobility               General bed mobility comments: pt up in recliner upon entering room  Transfers Overall transfer level: Needs assistance Equipment used: Rolling walker (2 wheeled) Transfers: Sit to/from Stand Sit to Stand: Mod assist         General transfer comment: needs assist for powering up and to steady, cues for hand placement    Balance Overall balance assessment: Needs assistance Sitting-balance support: No upper extremity supported;Feet supported Sitting balance-Leahy Scale:  Good     Standing balance support: Bilateral upper extremity supported;During functional activity;Single extremity supported Standing balance-Leahy Scale: Poor                              ADL Overall ADL's : Needs assistance/impaired     Grooming: Wash/dry hands;Wash/dry face;Minimal assistance;Standing Grooming Details (indicate cue type and reason): assist due to decreased balance Upper Body Bathing: Supervision/ safety;Set up;Sitting   Lower Body Bathing: Moderate assistance;Sitting/lateral leans;Sit to/from stand   Upper Body Dressing : Set up;Supervision/safety;Sitting;Cueing for safety   Lower Body Dressing: Maximal assistance;Cueing for safety;Sit to/from stand;Sitting/lateral leans   Toilet Transfer: Moderate assistance;RW Toilet Transfer Details (indicate cue type and reason): needs assist for powering up and to steady, cues for hand placement Toileting- Clothing Manipulation and Hygiene: Moderate assistance;Sit to/from stand       Functional mobility during ADLs: Moderate assistance;Cueing for safety General ADL Comments: decreased safety with LB ADLs and toileting due to decreased standing balance and needing assist for sit - stand     Vision  wears reading glasses                              Pertinent Vitals/Pain 7/10 pain in R index finer (noted finger edematous), VSS     Hand Dominance Right   Extremity/Trunk Assessment Upper Extremity Assessment Upper Extremity Assessment: Generalized weakness R index finger with edema  and decreased flexion   Lower Extremity Assessment Lower Extremity Assessment: Defer to PT evaluation       Communication Communication Communication: No difficulties   Cognition Arousal/Alertness: Awake/alert Behavior During Therapy: WFL for tasks assessed/performed Overall Cognitive Status: Within Functional Limits for tasks assessed                     General Comments   Pt pleasant and  cooperative                 Home Living Family/patient expects to be discharged to:: Private residence Living Arrangements: Alone Available Help at Discharge: Available PRN/intermittently Type of Home: House Home Access: Stairs to enter [employer] of Steps: 2   Home Layout: One level     Bathroom Shower/Tub: Tub/shower unit;Walk-in shower   Bathroom Toilet: Handicapped height     Home Equipment: [place] - single point;Walker - 2 wheels          Prior Functioning/Environment Level of Independence: Independent        Comments: Pt states that  had not been using her walker and that hse was independent wiht ADLs, cooking, laundry and was driving to the beauty salon    OT Diagnosis: Generalized weakness   OT Problem List: Decreased strength;Decreased knowledge of use of DME or AE;Impaired balance (sitting and/or standing);Decreased safety awareness;Decreased activity tolerance   OT Treatment/Interventions: Self-care/ADL training;Therapeutic exercise;Patient/family education;Neuromuscular education;Balance training;Therapeutic activities;DME and/or AE instruction    OT Goals(Current goals can be found in the care plan section) Acute Rehab OT Goals Patient Stated Goal: "I want to go home" OT Goal Formulation: With patient/family Time For Goal Achievement: 12/04/13 Potential to Achieve Goals: Good ADL Goals Pt Will Perform Grooming: with min guard assist;with supervision;with set-up;standing Pt Will Perform Lower Body Bathing: with min assist;sitting/lateral leans;sit to/from stand Pt Will Perform Lower Body Dressing: with mod assist;with min assist;sitting/lateral leans;sit to/from stand Pt Will Transfer to Toilet: with min assist;with min guard assist;ambulating;regular height toilet;grab bars (3 in 1 over toilet) Pt Will Perform Toileting - Clothing Manipulation and hygiene: with min guard assist;sit to/from stand  OT Frequency: Min 2X/week   Barriers to  D/C: Decreased caregiver support  pt lives at home alone and uncertain of she can have 24 hr assist/sup                     End of Session Equipment Utilized During Treatment: Gait belt;Rolling walker  Activity Tolerance: Patient tolerated treatment well Patient left: in chair;with call bell/phone within reach;with chair alarm set;with family/visitor present   Time: 1003-1038 OT Time Calculation (min): 35 min Charges:  OT General Charges $OT Visit: 1 Procedure OT Evaluation $Initial OT Evaluation Tier I: 1 Procedure OT Treatments $Self Care/Home Management : 8-22 mins $Therapeutic Activity: 8-22 mins G-Codes:    Mosetta Putt 12/27/13, 1:08 PM

## 2013-11-27 NOTE — Evaluation (Signed)
Physical Therapy Evaluation Patient Details Name: Carolyn [name] MRN: [id number] DOB: February 19, 1931 Today's Date: 11/27/2013   History of Present Illness  Carolyn [name] is a 80 y.o. female  past medical history of hypertension that comes in for confusion and fall,  she was on the floor was not able to get up. She relates it was due to the pain. She has been complaining of being weak and not wanting to ambulate. She denies any cough, fever, shortness of breath nausea or vomiting. Chest xray showed L lower lobe PNA  Clinical Impression   Pt admitted with above. Pt currently with functional limitations due to the deficits listed below (see PT Problem List).  Pt will benefit from skilled PT to increase their independence and safety with mobility to allow discharge to the venue listed below.       Follow Up Recommendations SNF    Equipment Recommendations  3in1 (PT)    Recommendations for Other Services       Precautions / Restrictions Precautions Precautions: Fall Restrictions Weight Bearing Restrictions: No      Mobility  Bed Mobility               General bed mobility comments: pt up in recliner upon entering room  Transfers Overall transfer level: Needs assistance Equipment used: Rolling walker (2 wheeled) Transfers: Sit to/from Stand Sit to Stand: Mod assist         General transfer comment: needs assist for powering up and to steady, cues for hand placement  Ambulation/Gait Ambulation/Gait assistance: Min assist Ambulation Distance (Feet): 50 Feet Assistive device: Rolling walker (2 wheeled) Gait Pattern/deviations: Trunk flexed Gait velocity: decr   General Gait Details: Cues for gait sequence, and to bear down into RW to unweigh painful L knee in stance; Cues alos for RW proximity  Stairs            Wheelchair Mobility    Modified Rankin (Stroke Patients Only)       Balance Overall balance assessment: Needs assistance Sitting-balance  support: No upper extremity supported;Feet supported Sitting balance-Leahy Scale: Good     Standing balance support: Bilateral upper extremity supported Standing balance-Leahy Scale: Poor                               Pertinent Vitals/Pain L knee pain, especially with WBing; longstanding arthritis per pt  Did not rate patient repositioned for comfort     Home Living Family/patient expects to be discharged to:: Private residence Living Arrangements: Alone Available Help at Discharge: Available PRN/intermittently Type of Home: House Home Access: Stairs to enter   [employer] of Steps: 2 Home Layout: One level Home Equipment: Cane - single point;Walker - 2 wheels      Prior Function Level of Independence: Independent         Comments: Pt states that  had not been using her walker and that hse was independent wiht ADLs, cooking, laundry and was driving to the beauty salon     Hand Dominance   Dominant Hand: Right    Extremity/Trunk Assessment   Upper Extremity Assessment: Defer to OT evaluation           Lower Extremity Assessment: Generalized weakness (R knee with OA pain)         Communication   Communication: No difficulties  Cognition Arousal/Alertness: Awake/alert Behavior During Therapy: WFL for tasks assessed/performed Overall Cognitive Status: Within Functional Limits for  tasks assessed                      General Comments      Exercises        Assessment/Plan    PT Assessment Patient needs continued PT services  PT Diagnosis Difficulty walking;Acute pain   PT Problem List Decreased strength;Decreased range of motion;Decreased activity tolerance;Decreased balance;Decreased mobility;Decreased coordination;Decreased cognition;Decreased knowledge of use of DME;Pain;Decreased safety awareness  PT Treatment Interventions DME instruction;Gait training;Stair training;Functional mobility training;Therapeutic  activities;Therapeutic exercise;Balance training;Patient/family education   PT Goals (Current goals can be found in the Care Plan section) Acute Rehab PT Goals Patient Stated Goal: "I want to go home" PT Goal Formulation: With patient Time For Goal Achievement: 11/27/13 Potential to Achieve Goals: Good    Frequency Min 3X/week   Barriers to discharge Decreased caregiver support Must be independent and safe to dc home    Co-evaluation               End of Session Equipment Utilized During Treatment: Gait belt Activity Tolerance: Patient tolerated treatment well Patient left: in chair;with call bell/phone within reach;with chair alarm set Nurse Communication: Mobility status         Time: 1740-8144 PT Time Calculation (min): 20 min   Charges:   PT Evaluation $Initial PT Evaluation Tier I: 1 Procedure PT Treatments $Gait Training: 8-22 mins   PT G Codes:          [facility] 11/27/2013, 1:28 PM  Roney Marion, [place] Pager [phone] Office [phone]

## 2013-11-28 LAB — BASIC METABOLIC PANEL
BUN: 11 mg/dL (ref 6–23)
CALCIUM: 9.3 mg/dL (ref 8.4–10.5)
CO2: 24 mEq/L (ref 19–32)
Chloride: 107 mEq/L (ref 96–112)
Creatinine, Ser: 0.58 mg/dL (ref 0.50–1.10)
GFR calc Af Amer: >90 mL/min (ref >90)
GFR calc non Af Amer: 85 mL/min — ABNORMAL LOW (ref >90)
GLUCOSE: 112 mg/dL — AB (ref 70–99)
Potassium: 4.1 mEq/L (ref 3.7–5.3)
SODIUM: 143 meq/L (ref 137–147)

## 2013-11-28 LAB — GLUCOSE, CAPILLARY
Glucose-Capillary: 128 mg/dL — ABNORMAL HIGH (ref 70–99)
Glucose-Capillary: 144 mg/dL — ABNORMAL HIGH (ref 70–99)

## 2013-11-28 MED ORDER — LEVOFLOXACIN 750 MG PO TABS: 750.0000 mg | ORAL_TABLET | Freq: Every day | ORAL | Status: DC

## 2013-11-28 MED ORDER — LEVOTHYROXINE SODIUM 50 MCG PO TABS: 50.0000 ug | ORAL_TABLET | Freq: Every day | ORAL | Status: DC

## 2013-11-28 MED ORDER — POTASSIUM CHLORIDE CRYS ER 20 MEQ PO TBCR
40.0000 meq | EXTENDED_RELEASE_TABLET | Freq: Once | ORAL | Status: AC
  Administered 2013-11-28: 40 meq via ORAL
  Filled 2013-11-28: qty 2

## 2013-11-28 NOTE — Discharge Summary (Signed)
Physician Discharge Summary  Carolyn [name] TDD:[id number] DOB: 14-May-1931 DOA: 11/26/2013  PCP: Pcp Not In System  Admit date: 11/26/2013 Discharge date: 11/28/2013  Time spent: 60 minutes  Recommendations for Outpatient Follow-up:  1. Follow up with PCP in 2-4 weeks for resolution of CAP symptoms and encephalopathy-rule out neurocognitive issues, and for TSH levels 2. Home with home health for PT, family available for 24 hr care per daughter 3. Patient needs a repeat chest x-ray in 6-8 weeks to document resolution of pneumonia  Discharge Diagnoses:  Principal Problem:   Hypothermia Active Problems:   CAP (community acquired pneumonia)   Weakness   Acute encephalopathy   Acute thyrotoxicosis   Discharge Condition: stable  Diet recommendation: Heart healthy  Filed Weights   11/26/13 1614 11/27/13 0500 11/28/13 0411  Weight: 67.495 kg (148 lb 12.8 oz) 68.4 kg (150 lb 12.7 oz) 67.858 kg (149 lb 9.6 oz)    History of present illness:  80 y/o F with PMH of HTN, hypothyroidism, Asthma, Arthritis, etc was brought into the ED via EMS on 5/5 for hypothermia and AMS due to a fall the night before and extended time down on linoleum prior to being found, ~12hr. Family reported she also fell on Monday and was in an empty tub for 4 hr. Pt states she gets dizzy before she falls, but doesn't actually pass out. And that pain in her L knee causes the fall. She has a walker/cane at home but admits to not always using it. Family reports not eating well recently but pt denies any issues.   In the ED: She was found to be hypothermic(96.7 rectal), chest x-ray was done that shows left lower lobe pneumonia, consulted for further evaluation. She was treated with 2 L of normal saline bolus in the emergency room. A Foley was placed.  Hospital Course:   Hypothermia -Resolved, due to extended stay on linoleum overnight from fall and PNA. Resolved with IV antibiotics and supportive care. -Home health for PT  and 24 hr family presence agreed upon by patient and daughter as opposed to going to SNF as reccommended by PT eval. -Family has already ordered a fall alert system to decrease/prevent future episodes  CAP -CXR finding of LLL increased patchy densities and COPD. Significantly improved after eating an empiric Rocephin and Zithromax, afebrile and nontoxic looking. No leukocytosis as well. -Blood and sputum cultures NGTD -Azithromycin and Rocephin while inpatient and albuterol PRN,will discharge on Levoquin x5 days  Hypothyroidism with suppressed TSH -Hx of hypothyroidism with thyroid hormone replacement, tachycardic, L hand tremor, general weakness --TSH 0.206, free T4 1.40  -Decreased synthroid dose to 50 mcg (previously on 100 mcg), recheck in 6 weeks at PCP  Weakness/falls -Multifactorial: thyrotoxicosis, knee pain, age related  -No rhabdo, clincal exam indicates chronic OA in L knee -PT reccomended SNF placement for PT but the patient and family chose home health, family ordered fall alert system  Acute encephalopathy -resolved with treatment of infection and thyrotoxicosis, pt is awake and appropriately conversant -outpatient follow up and revaluation when full antibiotic course has finished to r/o underlying disease  HTN -chronic, controlled -Continue on Metoprolol  Consultations:  PT  Discharge Exam: Filed Vitals:   11/28/13 1014  BP: 166/80  Pulse: 102  Temp:   Resp:     General: NAD, appears stated age/elderly  HEENT: PERR, Anicteic Sclera, MMM.  Neck: Supple, no JVD, no masses  Cardiovascular: tachycardic, S1 S2 auscultated, no rubs, murmurs or gallops.  Respiratory: equal  chest rise, mild rhonchi noted in the left base  Abdomen: Soft, nontender, nondistended, + bowel sounds  Extremities: warm dry without cyanosis clubbing. Erythema and swelling noted in DIP of right hand. Finger is tender to palpation on the dorsal side. Full sensation. Unable to flex completely.  L knee appears to be mildly swollen in comparison to R, mildly tender to palpation, some pain with passive movement.  Neuro: AOx3, cranial nerves intact.  Skin: Without rashes exudates or nodules.    Discharge Instructions    Medication List    STOP taking these medications       nitrofurantoin (macrocrystal-monohydrate) 100 MG capsule  Commonly known as:  MACROBID      TAKE these medications       ALPRAZolam 1 MG tablet  Commonly known as:  XANAX  Take 1 mg by mouth at bedtime as needed for anxiety.     CALTRATE 600+D 600-400 MG-UNIT per tablet  Generic drug:  Calcium Carbonate-Vitamin D  Take 1 tablet by mouth daily.     CRESTOR 10 MG tablet  Generic drug:  rosuvastatin  Take 10 mg by mouth daily.     FISH OIL PO  Take 1 capsule by mouth daily.     irbesartan 300 MG tablet  Commonly known as:  AVAPRO  Take 300 mg by mouth daily.     levofloxacin 750 MG tablet  Commonly known as:  LEVAQUIN  Take 1 tablet (750 mg total) by mouth daily.     levothyroxine 50 MCG tablet  Commonly known as:  SYNTHROID, LEVOTHROID  Take 1 tablet (50 mcg total) by mouth daily before breakfast.     MAGNESIUM PO  Take 1 tablet by mouth daily.     metoprolol succinate 50 MG 24 hr tablet  Commonly known as:  TOPROL-XL  Take 50 mg by mouth daily.     NEXIUM 40 MG capsule  Generic drug:  esomeprazole  Take 40 mg by mouth 2 (two) times daily before a meal.     oxyCODONE-acetaminophen 5-325 MG per tablet  Commonly known as:  PERCOCET/ROXICET  1 tablet every 6 (six) hours as needed (pain).       Allergies  Allergen Reactions  . Penicillins Hives   Follow-up Information   Follow up with Pcp Not In System.      Please follow up. (You need to see a family doctor in 2-4 weeks about your thyroid and your pneumonia.)        The results of significant diagnostics from this hospitalization (including imaging, microbiology, ancillary and laboratory) are listed below for reference.     Significant Diagnostic Studies: Dg Chest Port 1 View  11/26/2013   CLINICAL DATA:  Fall, weakness.  EXAM: PORTABLE CHEST - 1 VIEW  COMPARISON:  01/25/2011  FINDINGS: Increasing density noted at the left lung base concerning for pneumonia. Mild hyperinflation of the lungs. Heart is normal size. Right lung is clear. No effusions. No acute bony abnormality.  IMPRESSION: Increasing patchy density at the left lung base concerning for pneumonia.  COPD.   Electronically Signed   By: Rolm Baptise M.D.   On: 11/26/2013 12:36    Microbiology: Recent Results (from the past 240 hour(s))  URINE CULTURE     Status: None   Collection Time    11/26/13 12:21 PM      Result Value Ref Range Status   Specimen Description URINE, CATHETERIZED   Final   Special Requests NONE   Final  Culture  Setup Time     Final   Value: 11/26/2013 12:47     Performed at [facility]   Colony Count     Final   Value: NO GROWTH     Performed at [facility]   Culture     Final   Value: NO GROWTH     Performed at [facility]   Report Status 11/27/2013 FINAL   Final  CULTURE, BLOOD (ROUTINE X 2)     Status: None   Collection Time    11/26/13 12:48 PM      Result Value Ref Range Status   Specimen Description BLOOD LEFT ANTECUBITAL   Final   Special Requests BOTTLES DRAWN AEROBIC AND ANAEROBIC 5CC   Final   Culture  Setup Time     Final   Value: 11/26/2013 16:51     Performed at [facility]   Culture     Final   Value:        BLOOD CULTURE RECEIVED NO GROWTH TO DATE CULTURE WILL BE HELD FOR 5 DAYS BEFORE ISSUING A FINAL NEGATIVE REPORT     Performed at [facility]   Report Status PENDING   Incomplete  CULTURE, BLOOD (ROUTINE X 2)     Status: None   Collection Time    11/26/13  1:50 PM      Result Value Ref Range Status   Specimen Description BLOOD RIGHT ANTECUBITAL   Final   Special Requests BOTTLES DRAWN AEROBIC AND ANAEROBIC 10CC   Final   Culture  Setup Time     Final    Value: 11/26/2013 16:52     Performed at [facility]   Culture     Final   Value:        BLOOD CULTURE RECEIVED NO GROWTH TO DATE CULTURE WILL BE HELD FOR 5 DAYS BEFORE ISSUING A FINAL NEGATIVE REPORT     Performed at [facility]   Report Status PENDING   Incomplete     Labs: Basic Metabolic Panel:  Recent Labs Lab 11/26/13 1248 11/26/13 2020 11/27/13 0412 11/28/13 0627  NA 137  --  143 143  K 4.3  --  3.4* 4.1  CL 100  --  106 107  CO2 20  --  22 24  GLUCOSE 78  --  90 112*  BUN 13  --  13 11  CREATININE 0.49* 0.57 0.63 0.58  CALCIUM 9.1  --  8.6 9.3   Liver Function Tests:  Recent Labs Lab 11/26/13 1248 11/27/13 0412  AST 35 25  ALT 15 12  ALKPHOS 80 99  BILITOT 0.6 0.3  PROT 7.4 6.5  ALBUMIN 2.9* 2.4*   CBC:  Recent Labs Lab 11/26/13 1248 11/26/13 2020 11/27/13 0412  WBC 9.8 9.0 7.1  NEUTROABS 7.7  --   --   HGB 11.8* 12.2 11.3*  HCT 36.6 37.2 34.5*  MCV 94.6 93.5 93.2  PLT 216 252 237   Cardiac Enzymes:  Recent Labs Lab 11/26/13 1248  CKTOTAL 206*  CKMB 4.2*   CBG:  Recent Labs Lab 11/26/13 2206 11/27/13 1256 11/27/13 1659 11/27/13 2214 11/28/13 0956  GLUCAP 114* 142* 101* 124* 128*       Signed:  Claudie Revering PA-S  [facility] 11/28/2013, 11:35 AM  Attending - Patient seen and examined, agree with the above assessment and plan. Remains afebrile, significantly improved, mentation back to baseline. Blood cultures done on admission  remained negative. HIV, uterine Legionella and streptococcal antigen continue to be negative. Stable to be discharged on Levaquin. Was seen by physical therapy services yesterday, and thought to skilled nursing facility placement, patient and family refuse, they're aware of this catastrophic risk for falls, since significantly improved, stable for discharge. We'll be transitioned to Levaquin as outlined above. Will need a followup chest x-ray in 6-8 weeks to document  resolution of the pneumonia.  Nena Alexander MD

## 2013-11-28 NOTE — Progress Notes (Signed)
Carolyn [name] discharged Home with H/H PT/OT per MD order.  Discharge instructions reviewed and discussed with the patient, all questions and concerns answered. Copy of instructions, care notes for diagnosis & new medications, along with scripts given to patient.    Medication List    STOP taking these medications       nitrofurantoin (macrocrystal-monohydrate) 100 MG capsule  Commonly known as:  MACROBID      TAKE these medications       ALPRAZolam 1 MG tablet  Commonly known as:  XANAX  Take 1 mg by mouth at bedtime as needed for anxiety.     CALTRATE 600+D 600-400 MG-UNIT per tablet  Generic drug:  Calcium Carbonate-Vitamin D  Take 1 tablet by mouth daily.     CRESTOR 10 MG tablet  Generic drug:  rosuvastatin  Take 10 mg by mouth daily.     FISH OIL PO  Take 1 capsule by mouth daily.     irbesartan 300 MG tablet  Commonly known as:  AVAPRO  Take 300 mg by mouth daily.     levofloxacin 750 MG tablet  Commonly known as:  LEVAQUIN  Take 1 tablet (750 mg total) by mouth daily.     levothyroxine 50 MCG tablet  Commonly known as:  SYNTHROID, LEVOTHROID  Take 1 tablet (50 mcg total) by mouth daily before breakfast.     MAGNESIUM PO  Take 1 tablet by mouth daily.     metoprolol succinate 50 MG 24 hr tablet  Commonly known as:  TOPROL-XL  Take 50 mg by mouth daily.     NEXIUM 40 MG capsule  Generic drug:  esomeprazole  Take 40 mg by mouth 2 (two) times daily before a meal.     oxyCODONE-acetaminophen 5-325 MG per tablet  Commonly known as:  PERCOCET/ROXICET  1 tablet every 6 (six) hours as needed (pain).        Patients skin is clean, dry and intact, no evidence of skin break down. Pt. Had bruises to back & hips, abrasion to left elbow & right elbow reddened. IV site discontinued and catheter remains intact. Site without signs and symptoms of complications. Dressing and pressure applied.  Patient escorted to car by NT in a wheelchair,  no distress noted upon  discharge.  Marisela Line C Cyra Spader 11/28/2013 4:00 PM

## 2013-11-28 NOTE — Care Management Note (Signed)
    Page 1 of 2   11/28/2013     4:23:45 PM CARE MANAGEMENT NOTE 11/28/2013  Patient:  ROSEALEE, RECINOS   Account Number:  [id number]  Date Initiated:  11/28/2013  Documentation initiated by:  Tomi Bamberger  Subjective/Objective Assessment:   dx cap  admit- lives alone.     Action/Plan:   pt eval-rec snf, pt refuses snf, she will have 24 hrs care thru family members   Anticipated DC Date:  11/28/2013   Anticipated DC Plan:  [place]  CM consult      [facility] Choice  HOME HEALTH   Choice offered to / List presented to:  [name] Patient   DME arranged  [place]      DME agency  [place] arranged  [place].   Status of service:  Completed, signed off Medicare Important Message given?  YES (If response is "NO", the following Medicare IM given date fields will be blank) Date Medicare IM given:  11/26/2013 Date Additional Medicare IM given:    Discharge Disposition:  [place]  Per UR Regulation:  Reviewed for med. necessity/level of care/duration of stay  If discussed at [place] of Stay Meetings, dates discussed:    Comments:  11/28/13 [place], BSN [phone] patient lives alone, will have 24 hr care at dc thru family members.  Patient chose [facility] for hhpt/ot, referral made to [facility] , Butch Penny notified.  Soc will begin 24-48 hrs post discharge.

## 2013-11-28 NOTE — Clinical Social Work Placement (Signed)
Clinical Social Work Department CLINICAL SOCIAL WORK PLACEMENT NOTE 11/28/2013  Patient:  Carolyn [name], Carolyn [name]  Account Number:  [id number] Admit date:  11/26/2013  Clinical Social Worker:  Lovey Newcomer  Date/time:  11/27/2013 11:45 AM  Clinical Social Work is seeking post-discharge placement for this patient at the following level of care:   SKILLED NURSING   (*CSW will update this form in Epic as items are completed)   11/27/2013  Patient/family provided with [place] Department of Clinical Social Work's list of facilities offering this level of care within the geographic area requested by the patient (or if unable, by the patient's family).  11/27/2013  Patient/family informed of their freedom to choose among providers that offer the needed level of care, that participate in Medicare, Medicaid or managed care program needed by the patient, have an available bed and are willing to accept the patient.  11/27/2013  Patient/family informed of [facility]' ownership interest in [facility], as well as of the fact that they are under no obligation to receive care at this facility.  PASARR submitted to EDS on 11/27/2013 PASARR number received from EDS on 11/27/2013  FL2 transmitted to all facilities in geographic area requested by pt/family on  11/27/2013 FL2 transmitted to all facilities within larger geographic area on   Patient informed that his/her managed care company has contracts with or will negotiate with  certain facilities, including the following:     Patient/family informed of bed offers received:  11/28/2013 Patient chooses bed at  Physician recommends and patient chooses bed at    Patient to be transferred to  on   Patient to be transferred to facility by   The following physician request were entered in Epic:   Additional Comments: Patient is declining SNF at this time and states that she will be returning home with HHPT. Patient states that  her family will stay with her at home, patient's son at bedside has confirmed this. CSW gave patient bed offers and instructed her to follow up with facilities post discharge if she finds she cannot manage at home.    Liz Beach MSW, [place], [place], [phone]

## 2013-11-28 NOTE — Plan of Care (Signed)
Problem: Phase I Progression Outcomes Goal: Initial discharge plan identified Outcome: Completed/Met Date Met:  11/28/13 To return home

## 2013-11-28 NOTE — Discharge Planning (Signed)
Carolyn [name] to be D/C'd Home per MD order.  Discussed with the patient and all questions fully answered.    Medication List    STOP taking these medications       nitrofurantoin (macrocrystal-monohydrate) 100 MG capsule  Commonly known as:  MACROBID      TAKE these medications       ALPRAZolam 1 MG tablet  Commonly known as:  XANAX  Take 1 mg by mouth at bedtime as needed for anxiety.     CALTRATE 600+D 600-400 MG-UNIT per tablet  Generic drug:  Calcium Carbonate-Vitamin D  Take 1 tablet by mouth daily.     CRESTOR 10 MG tablet  Generic drug:  rosuvastatin  Take 10 mg by mouth daily.     FISH OIL PO  Take 1 capsule by mouth daily.     irbesartan 300 MG tablet  Commonly known as:  AVAPRO  Take 300 mg by mouth daily.     levofloxacin 750 MG tablet  Commonly known as:  LEVAQUIN  Take 1 tablet (750 mg total) by mouth daily.     levothyroxine 50 MCG tablet  Commonly known as:  SYNTHROID, LEVOTHROID  Take 1 tablet (50 mcg total) by mouth daily before breakfast.     MAGNESIUM PO  Take 1 tablet by mouth daily.     metoprolol succinate 50 MG 24 hr tablet  Commonly known as:  TOPROL-XL  Take 50 mg by mouth daily.     NEXIUM 40 MG capsule  Generic drug:  esomeprazole  Take 40 mg by mouth 2 (two) times daily before a meal.     oxyCODONE-acetaminophen 5-325 MG per tablet  Commonly known as:  PERCOCET/ROXICET  1 tablet every 6 (six) hours as needed (pain).        VVS, Skin clean, dry and intact without evidence of skin break down, no evidence of skin tears noted. IV catheter discontinued intact. Site without signs and symptoms of complications. Dressing and pressure applied.  An After Visit Summary was printed and given to the patient.  D/c education completed with patient/family including follow up instructions, medication list, d/c activities limitations if indicated, with other d/c instructions as indicated by MD - patient able to verbalize understanding, all  questions fully answered.   Patient instructed to return to ED, call 911, or call MD for any changes in condition.   Patient escorted via [place], and D/C home via private auto.  [name] 11/28/2013 4:10 PM

## 2013-11-28 NOTE — Discharge Instructions (Signed)
The strength of your thyroid medication was changed.  Please see your doctor about your thyroid and pneumonia in 2-4 weeks.   Follow with Primary MD   and other consultant as instructed your Hospitalist MD  Please get a complete blood count and chemistry panel checked by your Primary MD at your next visit, and again as instructed by your Primary MD.   If you had Pneumonia or Lung problems at the Hospital: Please get a 2 view Chest X ray done in 6-8 weeks after hospital discharge or sooner if instructed your Hospilatist MD at the hospital.   Get Medicines reviewed and adjusted. Please take all your medications with you for your next visit with your Primary MD  Please request your Primary MD to go over all hospital tests and procedure/radiological results at the follow up, please ask your Primary MD to get all Hospital records sent to his/her office.  If you experience worsening of your admission symptoms, develop shortness of breath, life threatening emergency, suicidal or homicidal thoughts you must seek medical attention immediately by calling 911 or calling your MD immediately  if symptoms less severe.  You must read complete instructions/literature along with all the possible adverse reactions/side effects for all the Medicines you take and that have been prescribed to you. Take any new Medicines after you have completely understood and accpet all the possible adverse reactions/side effects.   Do not drive when taking Pain medications.   Do not take more than prescribed Pain, Sleep and Anxiety Medications  Special Instructions: If you have smoked or chewed Tobacco  in the last 2 yrs please stop smoking, stop any regular Alcohol  and or any Recreational drug use.  Wear Seat belts while driving.  Please note  You were cared for by a hospitalist during your hospital stay. Once you are discharged, your primary care physician will handle any further medical issues. Please note that NO  REFILLS for any discharge medications will be authorized once you are discharged, as it is imperative that you return to your primary care physician (or establish a relationship with a primary care physician if you do not have one) for your aftercare needs so that they can reassess your need for medications and monitor your lab values.

## 2013-12-02 LAB — CULTURE, BLOOD (ROUTINE X 2)
CULTURE: NO GROWTH
Culture: NO GROWTH

## 2014-02-07 ENCOUNTER — Inpatient Hospital Stay (HOSPITAL_COMMUNITY): Payer: Medicare Other | Admitting: Anesthesiology

## 2014-02-07 ENCOUNTER — Emergency Department (HOSPITAL_COMMUNITY): Payer: Medicare Other

## 2014-02-07 ENCOUNTER — Inpatient Hospital Stay (HOSPITAL_COMMUNITY): Payer: Medicare Other

## 2014-02-07 ENCOUNTER — Encounter (HOSPITAL_COMMUNITY): Payer: Medicare Other | Admitting: Anesthesiology

## 2014-02-07 ENCOUNTER — Inpatient Hospital Stay (HOSPITAL_COMMUNITY)
Admission: EM | Admit: 2014-02-07 | Discharge: 2014-02-10 | DRG: 470 | Disposition: A | Discharge status: Home Health | Payer: Medicare Other | Attending: Internal Medicine | Admitting: Internal Medicine

## 2014-02-07 ENCOUNTER — Encounter (HOSPITAL_COMMUNITY)
Admission: EM | Disposition: A | Discharge status: Home Health | Payer: Self-pay | Source: Home / Self Care | Attending: Internal Medicine

## 2014-02-07 ENCOUNTER — Encounter (HOSPITAL_COMMUNITY): Payer: Self-pay | Admitting: Emergency Medicine

## 2014-02-07 DIAGNOSIS — Z87891 Personal history of nicotine dependence: Secondary | ICD-10-CM | POA: Diagnosis not present

## 2014-02-07 DIAGNOSIS — R110 Nausea: Secondary | ICD-10-CM | POA: Diagnosis not present

## 2014-02-07 DIAGNOSIS — I10 Essential (primary) hypertension: Secondary | ICD-10-CM | POA: Diagnosis present

## 2014-02-07 DIAGNOSIS — R531 Weakness: Secondary | ICD-10-CM

## 2014-02-07 DIAGNOSIS — J189 Pneumonia, unspecified organism: Secondary | ICD-10-CM

## 2014-02-07 DIAGNOSIS — M25559 Pain in unspecified hip: Secondary | ICD-10-CM | POA: Diagnosis present

## 2014-02-07 DIAGNOSIS — K449 Diaphragmatic hernia without obstruction or gangrene: Secondary | ICD-10-CM | POA: Diagnosis present

## 2014-02-07 DIAGNOSIS — K219 Gastro-esophageal reflux disease without esophagitis: Secondary | ICD-10-CM

## 2014-02-07 DIAGNOSIS — E039 Hypothyroidism, unspecified: Secondary | ICD-10-CM

## 2014-02-07 DIAGNOSIS — J449 Chronic obstructive pulmonary disease, unspecified: Secondary | ICD-10-CM | POA: Diagnosis present

## 2014-02-07 DIAGNOSIS — E7800 Pure hypercholesterolemia, unspecified: Secondary | ICD-10-CM | POA: Diagnosis present

## 2014-02-07 DIAGNOSIS — Z880 Allergy status to penicillin: Secondary | ICD-10-CM

## 2014-02-07 DIAGNOSIS — F411 Generalized anxiety disorder: Secondary | ICD-10-CM | POA: Diagnosis present

## 2014-02-07 DIAGNOSIS — F329 Major depressive disorder, single episode, unspecified: Secondary | ICD-10-CM | POA: Diagnosis present

## 2014-02-07 DIAGNOSIS — T4275XA Adverse effect of unspecified antiepileptic and sedative-hypnotic drugs, initial encounter: Secondary | ICD-10-CM | POA: Diagnosis not present

## 2014-02-07 DIAGNOSIS — F3289 Other specified depressive episodes: Secondary | ICD-10-CM | POA: Diagnosis present

## 2014-02-07 DIAGNOSIS — S72002A Fracture of unspecified part of neck of left femur, initial encounter for closed fracture: Secondary | ICD-10-CM

## 2014-02-07 DIAGNOSIS — W010XXA Fall on same level from slipping, tripping and stumbling without subsequent striking against object, initial encounter: Secondary | ICD-10-CM | POA: Diagnosis present

## 2014-02-07 DIAGNOSIS — S72033A Displaced midcervical fracture of unspecified femur, initial encounter for closed fracture: Principal | ICD-10-CM | POA: Diagnosis present

## 2014-02-07 DIAGNOSIS — E785 Hyperlipidemia, unspecified: Secondary | ICD-10-CM | POA: Diagnosis present

## 2014-02-07 DIAGNOSIS — S72009A Fracture of unspecified part of neck of unspecified femur, initial encounter for closed fracture: Secondary | ICD-10-CM

## 2014-02-07 DIAGNOSIS — S72001A Fracture of unspecified part of neck of right femur, initial encounter for closed fracture: Secondary | ICD-10-CM

## 2014-02-07 DIAGNOSIS — D649 Anemia, unspecified: Secondary | ICD-10-CM | POA: Diagnosis present

## 2014-02-07 DIAGNOSIS — G9340 Encephalopathy, unspecified: Secondary | ICD-10-CM

## 2014-02-07 DIAGNOSIS — Y92009 Unspecified place in unspecified non-institutional (private) residence as the place of occurrence of the external cause: Secondary | ICD-10-CM | POA: Diagnosis not present

## 2014-02-07 DIAGNOSIS — E0590 Thyrotoxicosis, unspecified without thyrotoxic crisis or storm: Secondary | ICD-10-CM

## 2014-02-07 DIAGNOSIS — J4489 Other specified chronic obstructive pulmonary disease: Secondary | ICD-10-CM | POA: Diagnosis present

## 2014-02-07 HISTORY — DX: Pneumonia, unspecified organism: J18.9

## 2014-02-07 HISTORY — PX: HIP ARTHROPLASTY: SHX981

## 2014-02-07 LAB — CBC WITH DIFFERENTIAL/PLATELET
Basophils Absolute: 0 10*3/uL (ref 0.0–0.1)
Basophils Relative: 0 % (ref 0–1)
EOS PCT: 2 % (ref 0–5)
Eosinophils Absolute: 0.1 10*3/uL (ref 0.0–0.7)
HEMATOCRIT: 38.2 % (ref 36.0–46.0)
Hemoglobin: 12.2 g/dL (ref 12.0–15.0)
LYMPHS ABS: 1 10*3/uL (ref 0.7–4.0)
Lymphocytes Relative: 14 % (ref 12–46)
MCH: 29.8 pg (ref 26.0–34.0)
MCHC: 31.9 g/dL (ref 30.0–36.0)
MCV: 93.2 fL (ref 78.0–100.0)
MONO ABS: 0.4 10*3/uL (ref 0.1–1.0)
Monocytes Relative: 5 % (ref 3–12)
Neutro Abs: 5.6 10*3/uL (ref 1.7–7.7)
Neutrophils Relative %: 79 % — ABNORMAL HIGH (ref 43–77)
PLATELETS: 221 10*3/uL (ref 150–400)
RBC: 4.1 MIL/uL (ref 3.87–5.11)
RDW: 14.4 % (ref 11.5–15.5)
WBC: 7.2 10*3/uL (ref 4.0–10.5)

## 2014-02-07 LAB — BASIC METABOLIC PANEL
ANION GAP: 13 (ref 5–15)
BUN: 21 mg/dL (ref 6–23)
CHLORIDE: 101 meq/L (ref 96–112)
CO2: 25 mEq/L (ref 19–32)
Calcium: 9.7 mg/dL (ref 8.4–10.5)
Creatinine, Ser: 0.78 mg/dL (ref 0.50–1.10)
GFR calc Af Amer: 89 mL/min — ABNORMAL LOW (ref >90)
GFR calc non Af Amer: 77 mL/min — ABNORMAL LOW (ref >90)
GLUCOSE: 111 mg/dL — AB (ref 70–99)
POTASSIUM: 4.5 meq/L (ref 3.7–5.3)
Sodium: 139 mEq/L (ref 137–147)

## 2014-02-07 LAB — URINALYSIS, ROUTINE W REFLEX MICROSCOPIC
Bilirubin Urine: NEGATIVE
Glucose, UA: NEGATIVE mg/dL
Hgb urine dipstick: NEGATIVE
Ketones, ur: NEGATIVE mg/dL
LEUKOCYTES UA: NEGATIVE
NITRITE: NEGATIVE
PH: 7 (ref 5.0–8.0)
Protein, ur: NEGATIVE mg/dL
SPECIFIC GRAVITY, URINE: 1.015 (ref 1.005–1.030)
Urobilinogen, UA: 0.2 mg/dL (ref 0.0–1.0)

## 2014-02-07 LAB — PROTIME-INR
INR: 1.01 (ref 0.00–1.49)
PROTHROMBIN TIME: 13.3 s (ref 11.6–15.2)

## 2014-02-07 LAB — TYPE AND SCREEN
ABO/RH(D): A POS
ANTIBODY SCREEN: NEGATIVE

## 2014-02-07 LAB — ABO/RH: ABO/RH(D): A POS

## 2014-02-07 LAB — SURGICAL PCR SCREEN
MRSA, PCR: NEGATIVE
STAPHYLOCOCCUS AUREUS: NEGATIVE

## 2014-02-07 SURGERY — HEMIARTHROPLASTY, HIP, DIRECT ANTERIOR APPROACH, FOR FRACTURE
Anesthesia: General | Site: Hip | Laterality: Left

## 2014-02-07 MED ORDER — MENTHOL 3 MG MT LOZG: 1.0000 | LOZENGE | OROMUCOSAL | Status: DC | PRN

## 2014-02-07 MED ORDER — HYDROMORPHONE HCL PF 1 MG/ML IJ SOLN: 0.2500 mg | INTRAMUSCULAR | Status: DC | PRN

## 2014-02-07 MED ORDER — GLYCOPYRROLATE 0.2 MG/ML IJ SOLN
INTRAMUSCULAR | Status: DC | PRN
  Administered 2014-02-07: 0.4 mg via INTRAVENOUS

## 2014-02-07 MED ORDER — DOCUSATE SODIUM 100 MG PO CAPS
100.0000 mg | ORAL_CAPSULE | Freq: Two times a day (BID) | ORAL | Status: DC
  Administered 2014-02-07 – 2014-02-10 (×6): 100 mg via ORAL
  Filled 2014-02-07 (×7): qty 1

## 2014-02-07 MED ORDER — ARTIFICIAL TEARS OP OINT
TOPICAL_OINTMENT | OPHTHALMIC | Status: AC
  Filled 2014-02-07: qty 3.5

## 2014-02-07 MED ORDER — ONDANSETRON HCL 4 MG/2ML IJ SOLN
INTRAMUSCULAR | Status: AC
  Filled 2014-02-07: qty 2

## 2014-02-07 MED ORDER — LIDOCAINE HCL (CARDIAC) 20 MG/ML IV SOLN
INTRAVENOUS | Status: DC | PRN
  Administered 2014-02-07: 50 mg via INTRAVENOUS

## 2014-02-07 MED ORDER — ACETAMINOPHEN 650 MG RE SUPP: 650.0000 mg | Freq: Four times a day (QID) | RECTAL | Status: DC | PRN

## 2014-02-07 MED ORDER — SODIUM CHLORIDE 0.9 % IV SOLN: INTRAVENOUS | Status: DC

## 2014-02-07 MED ORDER — NEOSTIGMINE METHYLSULFATE 10 MG/10ML IV SOLN
INTRAVENOUS | Status: AC
  Filled 2014-02-07: qty 1

## 2014-02-07 MED ORDER — CALCIUM CARBONATE-VITAMIN D 500-200 MG-UNIT PO TABS
1.0000 | ORAL_TABLET | Freq: Every day | ORAL | Status: DC
  Administered 2014-02-08 – 2014-02-10 (×3): 1 via ORAL
  Filled 2014-02-07 (×4): qty 1

## 2014-02-07 MED ORDER — OXYCODONE HCL 5 MG/5ML PO SOLN: 5.0000 mg | Freq: Once | ORAL | Status: DC | PRN

## 2014-02-07 MED ORDER — CEFAZOLIN SODIUM-DEXTROSE 2-3 GM-% IV SOLR
INTRAVENOUS | Status: AC
  Administered 2014-02-07: 2 g via INTRAVENOUS
  Filled 2014-02-07: qty 50

## 2014-02-07 MED ORDER — ONDANSETRON HCL 4 MG/2ML IJ SOLN
4.0000 mg | Freq: Four times a day (QID) | INTRAMUSCULAR | Status: DC | PRN
  Administered 2014-02-08 (×2): 4 mg via INTRAVENOUS
  Filled 2014-02-07 (×2): qty 2

## 2014-02-07 MED ORDER — 0.9 % SODIUM CHLORIDE (POUR BTL) OPTIME
TOPICAL | Status: DC | PRN
  Administered 2014-02-07: 1000 mL

## 2014-02-07 MED ORDER — NEOSTIGMINE METHYLSULFATE 10 MG/10ML IV SOLN
INTRAVENOUS | Status: DC | PRN
  Administered 2014-02-07: 3 mg via INTRAVENOUS

## 2014-02-07 MED ORDER — BUPIVACAINE-EPINEPHRINE 0.25% -1:200000 IJ SOLN
INTRAMUSCULAR | Status: DC | PRN
  Administered 2014-02-07: 18 mL

## 2014-02-07 MED ORDER — HYDROCODONE-ACETAMINOPHEN 5-325 MG PO TABS
1.0000 | ORAL_TABLET | Freq: Four times a day (QID) | ORAL | Status: DC | PRN
  Administered 2014-02-07: 2 via ORAL
  Administered 2014-02-08 – 2014-02-09 (×4): 1 via ORAL
  Administered 2014-02-10: 2 via ORAL
  Filled 2014-02-07 (×3): qty 1
  Filled 2014-02-07: qty 2
  Filled 2014-02-07: qty 1
  Filled 2014-02-07: qty 2

## 2014-02-07 MED ORDER — CALCIUM CARBONATE-VITAMIN D 600-400 MG-UNIT PO TABS: 1.0000 | ORAL_TABLET | Freq: Every day | ORAL | Status: DC

## 2014-02-07 MED ORDER — POLYETHYLENE GLYCOL 3350 17 G PO PACK: 17.0000 g | PACK | Freq: Every day | ORAL | Status: DC | PRN

## 2014-02-07 MED ORDER — FENTANYL CITRATE 0.05 MG/ML IJ SOLN
INTRAMUSCULAR | Status: AC
  Filled 2014-02-07: qty 5

## 2014-02-07 MED ORDER — MORPHINE SULFATE 2 MG/ML IJ SOLN: 0.5000 mg | INTRAMUSCULAR | Status: DC | PRN

## 2014-02-07 MED ORDER — FENTANYL CITRATE 0.05 MG/ML IJ SOLN
50.0000 ug | INTRAMUSCULAR | Status: AC | PRN
  Administered 2014-02-07 (×2): 50 ug via INTRAVENOUS
  Filled 2014-02-07 (×2): qty 2

## 2014-02-07 MED ORDER — SUCCINYLCHOLINE CHLORIDE 20 MG/ML IJ SOLN
INTRAMUSCULAR | Status: DC | PRN
  Administered 2014-02-07: 70 mg via INTRAVENOUS

## 2014-02-07 MED ORDER — LEVOTHYROXINE SODIUM 50 MCG PO TABS: 50.0000 ug | ORAL_TABLET | Freq: Every day | ORAL | Status: DC

## 2014-02-07 MED ORDER — HYDROCODONE-ACETAMINOPHEN 5-325 MG PO TABS: 1.0000 | ORAL_TABLET | Freq: Four times a day (QID) | ORAL | Status: DC | PRN

## 2014-02-07 MED ORDER — IRBESARTAN 300 MG PO TABS
300.0000 mg | ORAL_TABLET | Freq: Every day | ORAL | Status: DC
  Administered 2014-02-08 – 2014-02-10 (×3): 300 mg via ORAL
  Filled 2014-02-07 (×3): qty 1

## 2014-02-07 MED ORDER — ACETAMINOPHEN 325 MG PO TABS
650.0000 mg | ORAL_TABLET | Freq: Four times a day (QID) | ORAL | Status: DC | PRN
  Administered 2014-02-08 – 2014-02-09 (×4): 650 mg via ORAL
  Filled 2014-02-07 (×4): qty 2

## 2014-02-07 MED ORDER — FENTANYL CITRATE 0.05 MG/ML IJ SOLN
INTRAMUSCULAR | Status: DC | PRN
  Administered 2014-02-07: 50 ug via INTRAVENOUS
  Administered 2014-02-07: 75 ug via INTRAVENOUS
  Administered 2014-02-07: 25 ug via INTRAVENOUS
  Administered 2014-02-07: 50 ug via INTRAVENOUS

## 2014-02-07 MED ORDER — LACTATED RINGERS IV SOLN
INTRAVENOUS | Status: DC | PRN
  Administered 2014-02-07: 18:00:00 via INTRAVENOUS

## 2014-02-07 MED ORDER — DOCUSATE SODIUM 100 MG PO CAPS: 100.0000 mg | ORAL_CAPSULE | Freq: Two times a day (BID) | ORAL | Status: DC

## 2014-02-07 MED ORDER — CEFAZOLIN SODIUM-DEXTROSE 2-3 GM-% IV SOLR: 2.0000 g | Freq: Once | INTRAVENOUS | Status: DC

## 2014-02-07 MED ORDER — GLYCOPYRROLATE 0.2 MG/ML IJ SOLN
INTRAMUSCULAR | Status: AC
  Filled 2014-02-07: qty 2

## 2014-02-07 MED ORDER — POTASSIUM CHLORIDE IN NACL 20-0.9 MEQ/L-% IV SOLN
INTRAVENOUS | Status: DC
  Administered 2014-02-08: 01:00:00 via INTRAVENOUS
  Filled 2014-02-07 (×2): qty 1000

## 2014-02-07 MED ORDER — PANTOPRAZOLE SODIUM 40 MG PO TBEC: 80.0000 mg | DELAYED_RELEASE_TABLET | Freq: Every day | ORAL | Status: DC

## 2014-02-07 MED ORDER — METOPROLOL TARTRATE 1 MG/ML IV SOLN: 5.0000 mg | Freq: Four times a day (QID) | INTRAVENOUS | Status: DC | PRN

## 2014-02-07 MED ORDER — METOCLOPRAMIDE HCL 5 MG/ML IJ SOLN: 5.0000 mg | Freq: Three times a day (TID) | INTRAMUSCULAR | Status: DC | PRN

## 2014-02-07 MED ORDER — BISACODYL 10 MG RE SUPP: 10.0000 mg | Freq: Every day | RECTAL | Status: DC | PRN

## 2014-02-07 MED ORDER — METOCLOPRAMIDE HCL 10 MG PO TABS
5.0000 mg | ORAL_TABLET | Freq: Three times a day (TID) | ORAL | Status: DC | PRN
  Administered 2014-02-07: 10 mg via ORAL
  Filled 2014-02-07: qty 1

## 2014-02-07 MED ORDER — PHENOL 1.4 % MT LIQD: 1.0000 | OROMUCOSAL | Status: DC | PRN

## 2014-02-07 MED ORDER — PANTOPRAZOLE SODIUM 40 MG PO TBEC
80.0000 mg | DELAYED_RELEASE_TABLET | Freq: Every day | ORAL | Status: DC
  Administered 2014-02-08 – 2014-02-10 (×3): 80 mg via ORAL
  Filled 2014-02-07 (×3): qty 2

## 2014-02-07 MED ORDER — METOPROLOL SUCCINATE ER 50 MG PO TB24
50.0000 mg | ORAL_TABLET | Freq: Every day | ORAL | Status: DC
  Administered 2014-02-08 – 2014-02-10 (×3): 50 mg via ORAL
  Filled 2014-02-07 (×3): qty 1

## 2014-02-07 MED ORDER — PROPOFOL 10 MG/ML IV BOLUS
INTRAVENOUS | Status: DC | PRN
  Administered 2014-02-07: 100 mg via INTRAVENOUS

## 2014-02-07 MED ORDER — ONDANSETRON HCL 4 MG/2ML IJ SOLN
INTRAMUSCULAR | Status: DC | PRN
  Administered 2014-02-07: 4 mg via INTRAVENOUS

## 2014-02-07 MED ORDER — ROCURONIUM BROMIDE 50 MG/5ML IV SOLN
INTRAVENOUS | Status: AC
  Filled 2014-02-07: qty 2

## 2014-02-07 MED ORDER — ATORVASTATIN CALCIUM 20 MG PO TABS
20.0000 mg | ORAL_TABLET | Freq: Every day | ORAL | Status: DC
  Administered 2014-02-08 – 2014-02-09 (×2): 20 mg via ORAL
  Filled 2014-02-07 (×4): qty 1

## 2014-02-07 MED ORDER — ONDANSETRON HCL 4 MG PO TABS: 4.0000 mg | ORAL_TABLET | Freq: Four times a day (QID) | ORAL | Status: DC | PRN

## 2014-02-07 MED ORDER — FENTANYL CITRATE 0.05 MG/ML IJ SOLN: 100.0000 ug | Freq: Once | INTRAMUSCULAR | Status: DC

## 2014-02-07 MED ORDER — OXYCODONE-ACETAMINOPHEN 5-325 MG PO TABS
1.0000 | ORAL_TABLET | Freq: Four times a day (QID) | ORAL | Status: DC | PRN
  Administered 2014-02-08 – 2014-02-09 (×4): 1 via ORAL
  Filled 2014-02-07 (×5): qty 1

## 2014-02-07 MED ORDER — ALPRAZOLAM 0.5 MG PO TABS
1.0000 mg | ORAL_TABLET | Freq: Every evening | ORAL | Status: DC | PRN
  Administered 2014-02-08 – 2014-02-09 (×2): 1 mg via ORAL
  Filled 2014-02-07 (×2): qty 2

## 2014-02-07 MED ORDER — ROCURONIUM BROMIDE 100 MG/10ML IV SOLN
INTRAVENOUS | Status: DC | PRN
  Administered 2014-02-07: 25 mg via INTRAVENOUS

## 2014-02-07 MED ORDER — FENTANYL CITRATE 0.05 MG/ML IJ SOLN
50.0000 ug | INTRAMUSCULAR | Status: DC | PRN
  Administered 2014-02-07 (×3): 50 ug via INTRAVENOUS
  Filled 2014-02-07 (×3): qty 2

## 2014-02-07 MED ORDER — ASPIRIN EC 325 MG PO TBEC
325.0000 mg | DELAYED_RELEASE_TABLET | Freq: Every day | ORAL | Status: DC
  Administered 2014-02-08 – 2014-02-10 (×3): 325 mg via ORAL
  Filled 2014-02-07 (×4): qty 1

## 2014-02-07 MED ORDER — LACTATED RINGERS IV SOLN
INTRAVENOUS | Status: DC
  Administered 2014-02-07: 50 mL/h via INTRAVENOUS

## 2014-02-07 MED ORDER — OXYCODONE HCL 5 MG PO TABS: 5.0000 mg | ORAL_TABLET | Freq: Once | ORAL | Status: DC | PRN

## 2014-02-07 MED ORDER — HYDRALAZINE HCL 20 MG/ML IJ SOLN
5.0000 mg | Freq: Once | INTRAMUSCULAR | Status: AC
  Administered 2014-02-07: 5 mg via INTRAVENOUS
  Filled 2014-02-07: qty 1

## 2014-02-07 MED ORDER — BUPIVACAINE-EPINEPHRINE (PF) 0.25% -1:200000 IJ SOLN
INTRAMUSCULAR | Status: AC
  Filled 2014-02-07: qty 30

## 2014-02-07 MED ORDER — LEVOTHYROXINE SODIUM 25 MCG PO TABS
25.0000 ug | ORAL_TABLET | Freq: Every day | ORAL | Status: DC
  Administered 2014-02-08 – 2014-02-10 (×3): 25 ug via ORAL
  Filled 2014-02-07 (×4): qty 1

## 2014-02-07 MED ORDER — PROMETHAZINE HCL 25 MG/ML IJ SOLN: 6.2500 mg | INTRAMUSCULAR | Status: DC | PRN

## 2014-02-07 SURGICAL SUPPLY — 70 items
APL SKNCLS STERI-STRIP NONHPOA (GAUZE/BANDAGES/DRESSINGS) ×1
BENZOIN TINCTURE PRP APPL 2/3 (GAUZE/BANDAGES/DRESSINGS) ×3 IMPLANT
BLADE SAW SAG 73X25 THK (BLADE) ×2
BLADE SAW SGTL 73X25 THK (BLADE) ×1 IMPLANT
BLADE SURG ROTATE 9660 (MISCELLANEOUS) IMPLANT
BRUSH FEMORAL CANAL (MISCELLANEOUS) IMPLANT
CAPT HIP FX BIPOLAR/UNIPOLAR ×2 IMPLANT
CLOSURE WOUND 1/2 X4 (GAUZE/BANDAGES/DRESSINGS) ×2
COVER BACK TABLE 24X17X13 BIG (DRAPES) IMPLANT
DRAPE INCISE IOBAN 66X45 STRL (DRAPES) ×2 IMPLANT
DRAPE ORTHO SPLIT 77X108 STRL (DRAPES) ×6
DRAPE SURG ORHT 6 SPLT 77X108 (DRAPES) ×2 IMPLANT
DRAPE U-SHAPE 47X51 STRL (DRAPES) ×3 IMPLANT
DRSG MEPILEX BORDER 4X8 (GAUZE/BANDAGES/DRESSINGS) ×1 IMPLANT
DRSG PAD ABDOMINAL 8X10 ST (GAUZE/BANDAGES/DRESSINGS) ×4 IMPLANT
DURAPREP 26ML APPLICATOR (WOUND CARE) ×3 IMPLANT
ELECT CAUTERY BLADE 6.4 (BLADE) ×3 IMPLANT
ELECT REM PT RETURN 9FT ADLT (ELECTROSURGICAL) ×3
ELECTRODE REM PT RTRN 9FT ADLT (ELECTROSURGICAL) ×1 IMPLANT
EVACUATOR 1/8 PVC DRAIN (DRAIN) IMPLANT
FACESHIELD WRAPAROUND (MASK) ×3 IMPLANT
FACESHIELD WRAPAROUND OR TEAM (MASK) ×2 IMPLANT
GAUZE XEROFORM 5X9 LF (GAUZE/BANDAGES/DRESSINGS) ×3 IMPLANT
GLOVE BIOGEL PI IND STRL 7.5 (GLOVE) ×1 IMPLANT
GLOVE BIOGEL PI IND STRL 8 (GLOVE) ×1 IMPLANT
GLOVE BIOGEL PI INDICATOR 7.5 (GLOVE)
GLOVE BIOGEL PI INDICATOR 8 (GLOVE) ×2
GLOVE ECLIPSE 7.0 STRL STRAW (GLOVE) ×1 IMPLANT
GLOVE ORTHO TXT STRL SZ7.5 (GLOVE) ×3 IMPLANT
GOWN STRL REUS W/ TWL LRG LVL3 (GOWN DISPOSABLE) ×1 IMPLANT
GOWN STRL REUS W/ TWL XL LVL3 (GOWN DISPOSABLE) ×1 IMPLANT
GOWN STRL REUS W/TWL LRG LVL3 (GOWN DISPOSABLE) ×3
GOWN STRL REUS W/TWL XL LVL3 (GOWN DISPOSABLE) ×3
HANDPIECE INTERPULSE COAX TIP (DISPOSABLE)
IMMOBILIZER KNEE 22 UNIV (SOFTGOODS) ×2 IMPLANT
KIT BASIN OR (CUSTOM PROCEDURE TRAY) ×3 IMPLANT
KIT ROOM TURNOVER OR (KITS) ×3 IMPLANT
MANIFOLD NEPTUNE II (INSTRUMENTS) ×3 IMPLANT
NDL HYPO 25GX1X1/2 BEV (NEEDLE) ×1 IMPLANT
NDL SUT 2 .5 CRC MAYO 1.732X (NEEDLE) ×1 IMPLANT
NEEDLE HYPO 25GX1X1/2 BEV (NEEDLE) ×3 IMPLANT
NEEDLE MAYO TAPER (NEEDLE)
NS IRRIG 1000ML POUR BTL (IV SOLUTION) ×3 IMPLANT
PACK TOTAL JOINT (CUSTOM PROCEDURE TRAY) ×3 IMPLANT
PAD ABD 8X10 STRL (GAUZE/BANDAGES/DRESSINGS) IMPLANT
PAD ARMBOARD 7.5X6 YLW CONV (MISCELLANEOUS) ×6 IMPLANT
PIN STEINMAN 3/16 (PIN) ×1 IMPLANT
SET HNDPC FAN SPRY TIP SCT (DISPOSABLE) IMPLANT
SPONGE GAUZE 4X4 12PLY (GAUZE/BANDAGES/DRESSINGS) ×3 IMPLANT
SPONGE GAUZE 4X4 12PLY STER LF (GAUZE/BANDAGES/DRESSINGS) ×2 IMPLANT
SPONGE LAP 4X18 X RAY DECT (DISPOSABLE) ×2 IMPLANT
STAPLER VISISTAT 35W (STAPLE) ×3 IMPLANT
STRIP CLOSURE SKIN 1/2X4 (GAUZE/BANDAGES/DRESSINGS) ×4 IMPLANT
SUCTION FRAZIER TIP 10 FR DISP (SUCTIONS) ×3 IMPLANT
SUT ETHIBOND NAB CT1 #1 30IN (SUTURE) ×7 IMPLANT
SUT TICRON (SUTURE) ×2 IMPLANT
SUT VIC AB 0 CT1 27 (SUTURE) ×3
SUT VIC AB 0 CT1 27XBRD ANBCTR (SUTURE) IMPLANT
SUT VIC AB 1 CT1 27 (SUTURE) ×6
SUT VIC AB 1 CT1 27XBRD ANBCTR (SUTURE) IMPLANT
SUT VIC AB 2-0 CT1 27 (SUTURE) ×3
SUT VIC AB 2-0 CT1 TAPERPNT 27 (SUTURE) ×3 IMPLANT
SUT VICRYL 0 TIES 12 18 (SUTURE) ×1 IMPLANT
SYR CONTROL 10ML LL (SYRINGE) ×3 IMPLANT
TAPE CLOTH SURG 4X10 WHT LF (GAUZE/BANDAGES/DRESSINGS) ×2 IMPLANT
TOWEL OR 17X24 6PK STRL BLUE (TOWEL DISPOSABLE) ×3 IMPLANT
TOWEL OR 17X26 10 PK STRL BLUE (TOWEL DISPOSABLE) ×3 IMPLANT
TOWER CARTRIDGE SMART MIX (DISPOSABLE) IMPLANT
TRAY FOLEY CATH 16FRSI W/METER (SET/KITS/TRAYS/PACK) IMPLANT
WATER STERILE IRR 1000ML POUR (IV SOLUTION) ×12 IMPLANT

## 2014-02-07 NOTE — H&P (View-Only) (Signed)
Reason for Consult:hip fracture Referring Physician: ED physician  Carolyn [name] is an 80 y.o. female.  HPI: Pt feel at home this am while in the yard.  Pain in the left hip.  Unable to bear weight on the left hip.  Brought to ED by EMS.  Radiographs show displaced left femoral neck fracture.  Pt will require left hip hemiarthroplasty.  Risks and benefits discussed with pt and surgery will be done later today.    Past Medical History  Diagnosis Date  . Hypertension   . High cholesterol   . Hypothyroidism   . Asthma     "only when I smoked"  . H/O hiatal hernia     "twice"  . GERD (gastroesophageal reflux disease)   . Arthritis     "both knees" (11/26/2013)  . Depression     "little bit" (11/26/2013)  . Anxiety     "little bit" (11/26/2013)  . COPD (chronic obstructive pulmonary disease)     Past Surgical History  Procedure Laterality Date  . Hernia repair    . Cholecystectomy    . Bladder suspension      "w/hysterectomy"  . Hiatal hernia repair  X 2  . Abdominal hysterectomy    . Cataract extraction w/ intraocular lens  implant, bilateral Bilateral   . Appendectomy      No family history on file.  Social History:  reports that she has quit smoking. Her smoking use included Cigarettes. She has a 10 pack-year smoking history. She has never used smokeless tobacco. She reports that she does not drink alcohol or use illicit drugs.  Allergies:  Allergies  Allergen Reactions  . Penicillins Hives    Medications: I have reviewed the patient's current medications.  Results for orders placed during the hospital encounter of 02/07/14 (from the past 48 hour(s))  BASIC METABOLIC PANEL     Status: Abnormal   Collection Time    02/07/14 10:31 AM      Result Value Ref Range   Sodium 139  137 - 147 mEq/L   Potassium 4.5  3.7 - 5.3 mEq/L   Chloride 101  96 - 112 mEq/L   CO2 25  19 - 32 mEq/L   Glucose, Bld 111 (*) 70 - 99 mg/dL   BUN 21  6 - 23 mg/dL   Creatinine, Ser 0.78  0.50  - 1.10 mg/dL   Calcium 9.7  8.4 - 10.5 mg/dL   GFR calc non Af Amer 77 (*) >90 mL/min   GFR calc Af Amer 89 (*) >90 mL/min   Comment: (NOTE)     The eGFR has been calculated using the CKD EPI equation.     This calculation has not been validated in all clinical situations.     eGFR's persistently <90 mL/min signify possible Chronic Kidney     Disease.   Anion gap 13  5 - 15  CBC WITH DIFFERENTIAL     Status: Abnormal   Collection Time    02/07/14 10:31 AM      Result Value Ref Range   WBC 7.2  4.0 - 10.5 K/uL   RBC 4.10  3.87 - 5.11 MIL/uL   Hemoglobin 12.2  12.0 - 15.0 g/dL   HCT 38.2  36.0 - 46.0 %   MCV 93.2  78.0 - 100.0 fL   MCH 29.8  26.0 - 34.0 pg   MCHC 31.9  30.0 - 36.0 g/dL   RDW 14.4  11.5 - 15.5 %  Platelets 221  150 - 400 K/uL   Neutrophils Relative % 79 (*) 43 - 77 %   Neutro Abs 5.6  1.7 - 7.7 K/uL   Lymphocytes Relative 14  12 - 46 %   Lymphs Abs 1.0  0.7 - 4.0 K/uL   Monocytes Relative 5  3 - 12 %   Monocytes Absolute 0.4  0.1 - 1.0 K/uL   Eosinophils Relative 2  0 - 5 %   Eosinophils Absolute 0.1  0.0 - 0.7 K/uL   Basophils Relative 0  0 - 1 %   Basophils Absolute 0.0  0.0 - 0.1 K/uL  PROTIME-INR     Status: None   Collection Time    02/07/14 10:31 AM      Result Value Ref Range   Prothrombin Time 13.3  11.6 - 15.2 seconds   INR 1.01  0.00 - 1.49  TYPE AND SCREEN     Status: None   Collection Time    02/07/14 10:36 AM      Result Value Ref Range   ABO/RH(D) A POS     Antibody Screen NEG     Sample Expiration 02/10/2014    URINALYSIS, ROUTINE W REFLEX MICROSCOPIC     Status: None   Collection Time    02/07/14 11:20 AM      Result Value Ref Range   Color, Urine YELLOW  YELLOW   APPearance CLEAR  CLEAR   Specific Gravity, Urine 1.015  1.005 - 1.030   pH 7.0  5.0 - 8.0   Glucose, UA NEGATIVE  NEGATIVE mg/dL   Hgb urine dipstick NEGATIVE  NEGATIVE   Bilirubin Urine NEGATIVE  NEGATIVE   Ketones, ur NEGATIVE  NEGATIVE mg/dL   Protein, ur NEGATIVE   NEGATIVE mg/dL   Urobilinogen, UA 0.2  0.0 - 1.0 mg/dL   Nitrite NEGATIVE  NEGATIVE   Leukocytes, UA NEGATIVE  NEGATIVE   Comment: MICROSCOPIC NOT DONE ON URINES WITH NEGATIVE PROTEIN, BLOOD, LEUKOCYTES, NITRITE, OR GLUCOSE <1000 mg/dL.    Dg Chest 1 View  02/07/2014   CLINICAL DATA:  Status post left hip fracture  EXAM: CHEST - 1 VIEW  COMPARISON:  PA and lateral chest of Dec 09, 2013  FINDINGS: The lungs remain hyperinflated. There is stable blunting of the left lateral costophrenic angle. The heart is normal in size. The pulmonary vascularity is normal. The mediastinum is normal in width. There is a small hiatal hernia. There is curvature of the thoracolumbar spine with the convexity towards the right. The observed portions of the ribs are normal.  IMPRESSION: COPD and chronic changes at the left base. There is no acute cardiopulmonary abnormality.   Electronically Signed   By: David  Martinique   On: 02/07/2014 11:13   Dg Hip Complete Left  02/07/2014   CLINICAL DATA:  LEFT hip pain and injury post fall  EXAM: LEFT HIP - COMPLETE 2+ VIEW  COMPARISON:  09/30/2013  FINDINGS: Diffuse osseous demineralization.  Displaced subcapital fracture LEFT femoral neck.  No dislocation.  Pelvis appears intact.  Symmetric hip and SI joints.  Scattered vascular calcification.  IMPRESSION: Displaced subcapital fracture of LEFT femoral neck, new.   Electronically Signed   By: Lavonia Dana M.D.   On: 02/07/2014 11:14    Review of Systems  Musculoskeletal: Positive for falls and joint pain.       Left hip only  All other systems reviewed and are negative.  Blood pressure 204/88, temperature 97.6 F (36.4 C),  temperature source Oral, resp. rate 11, height '5\' 9"'  (1.753 m), weight 66.679 kg (147 lb), SpO2 97.00%. Physical Exam  Cardiovascular: Intact distal pulses.   Musculoskeletal:  Left LE with external rotation and shortening.  Pain with attempts at motion of left leg.    Assessment/Plan: Displaced femoral  neck fracture left.  PLAN:  Left hip hemiarthroplasty  Carolyn [name] M 02/07/2014, 12:37 PM

## 2014-02-07 NOTE — Consult Note (Signed)
Reason for Consult:hip fracture Referring Physician: ED physician  Carolyn [name] is an 80 y.o. female.  HPI: Pt feel at home this am while in the yard.  Pain in the left hip.  Unable to bear weight on the left hip.  Brought to ED by EMS.  Radiographs show displaced left femoral neck fracture.  Pt will require left hip hemiarthroplasty.  Risks and benefits discussed with pt and surgery will be done later today.    Past Medical History  Diagnosis Date  . Hypertension   . High cholesterol   . Hypothyroidism   . Asthma     "only when I smoked"  . H/O hiatal hernia     "twice"  . GERD (gastroesophageal reflux disease)   . Arthritis     "both knees" (11/26/2013)  . Depression     "little bit" (11/26/2013)  . Anxiety     "little bit" (11/26/2013)  . COPD (chronic obstructive pulmonary disease)     Past Surgical History  Procedure Laterality Date  . Hernia repair    . Cholecystectomy    . Bladder suspension      "w/hysterectomy"  . Hiatal hernia repair  X 2  . Abdominal hysterectomy    . Cataract extraction w/ intraocular lens  implant, bilateral Bilateral   . Appendectomy      No family history on file.  Social History:  reports that she has quit smoking. Her smoking use included Cigarettes. She has a 10 pack-year smoking history. She has never used smokeless tobacco. She reports that she does not drink alcohol or use illicit drugs.  Allergies:  Allergies  Allergen Reactions  . Penicillins Hives    Medications: I have reviewed the patient's current medications.  Results for orders placed during the hospital encounter of 02/07/14 (from the past 48 hour(s))  BASIC METABOLIC PANEL     Status: Abnormal   Collection Time    02/07/14 10:31 AM      Result Value Ref Range   Sodium 139  137 - 147 mEq/L   Potassium 4.5  3.7 - 5.3 mEq/L   Chloride 101  96 - 112 mEq/L   CO2 25  19 - 32 mEq/L   Glucose, Bld 111 (*) 70 - 99 mg/dL   BUN 21  6 - 23 mg/dL   Creatinine, Ser 0.78  0.50  - 1.10 mg/dL   Calcium 9.7  8.4 - 10.5 mg/dL   GFR calc non Af Amer 77 (*) >90 mL/min   GFR calc Af Amer 89 (*) >90 mL/min   Comment: (NOTE)     The eGFR has been calculated using the CKD EPI equation.     This calculation has not been validated in all clinical situations.     eGFR's persistently <90 mL/min signify possible Chronic Kidney     Disease.   Anion gap 13  5 - 15  CBC WITH DIFFERENTIAL     Status: Abnormal   Collection Time    02/07/14 10:31 AM      Result Value Ref Range   WBC 7.2  4.0 - 10.5 K/uL   RBC 4.10  3.87 - 5.11 MIL/uL   Hemoglobin 12.2  12.0 - 15.0 g/dL   HCT 38.2  36.0 - 46.0 %   MCV 93.2  78.0 - 100.0 fL   MCH 29.8  26.0 - 34.0 pg   MCHC 31.9  30.0 - 36.0 g/dL   RDW 14.4  11.5 - 15.5 %  Platelets 221  150 - 400 K/uL   Neutrophils Relative % 79 (*) 43 - 77 %   Neutro Abs 5.6  1.7 - 7.7 K/uL   Lymphocytes Relative 14  12 - 46 %   Lymphs Abs 1.0  0.7 - 4.0 K/uL   Monocytes Relative 5  3 - 12 %   Monocytes Absolute 0.4  0.1 - 1.0 K/uL   Eosinophils Relative 2  0 - 5 %   Eosinophils Absolute 0.1  0.0 - 0.7 K/uL   Basophils Relative 0  0 - 1 %   Basophils Absolute 0.0  0.0 - 0.1 K/uL  PROTIME-INR     Status: None   Collection Time    02/07/14 10:31 AM      Result Value Ref Range   Prothrombin Time 13.3  11.6 - 15.2 seconds   INR 1.01  0.00 - 1.49  TYPE AND SCREEN     Status: None   Collection Time    02/07/14 10:36 AM      Result Value Ref Range   ABO/RH(D) A POS     Antibody Screen NEG     Sample Expiration 02/10/2014    URINALYSIS, ROUTINE W REFLEX MICROSCOPIC     Status: None   Collection Time    02/07/14 11:20 AM      Result Value Ref Range   Color, Urine YELLOW  YELLOW   APPearance CLEAR  CLEAR   Specific Gravity, Urine 1.015  1.005 - 1.030   pH 7.0  5.0 - 8.0   Glucose, UA NEGATIVE  NEGATIVE mg/dL   Hgb urine dipstick NEGATIVE  NEGATIVE   Bilirubin Urine NEGATIVE  NEGATIVE   Ketones, ur NEGATIVE  NEGATIVE mg/dL   Protein, ur NEGATIVE   NEGATIVE mg/dL   Urobilinogen, UA 0.2  0.0 - 1.0 mg/dL   Nitrite NEGATIVE  NEGATIVE   Leukocytes, UA NEGATIVE  NEGATIVE   Comment: MICROSCOPIC NOT DONE ON URINES WITH NEGATIVE PROTEIN, BLOOD, LEUKOCYTES, NITRITE, OR GLUCOSE <1000 mg/dL.    Dg Chest 1 View  02/07/2014   CLINICAL DATA:  Status post left hip fracture  EXAM: CHEST - 1 VIEW  COMPARISON:  PA and lateral chest of Dec 09, 2013  FINDINGS: The lungs remain hyperinflated. There is stable blunting of the left lateral costophrenic angle. The heart is normal in size. The pulmonary vascularity is normal. The mediastinum is normal in width. There is a small hiatal hernia. There is curvature of the thoracolumbar spine with the convexity towards the right. The observed portions of the ribs are normal.  IMPRESSION: COPD and chronic changes at the left base. There is no acute cardiopulmonary abnormality.   Electronically Signed   By: David  Martinique   On: 02/07/2014 11:13   Dg Hip Complete Left  02/07/2014   CLINICAL DATA:  LEFT hip pain and injury post fall  EXAM: LEFT HIP - COMPLETE 2+ VIEW  COMPARISON:  09/30/2013  FINDINGS: Diffuse osseous demineralization.  Displaced subcapital fracture LEFT femoral neck.  No dislocation.  Pelvis appears intact.  Symmetric hip and SI joints.  Scattered vascular calcification.  IMPRESSION: Displaced subcapital fracture of LEFT femoral neck, new.   Electronically Signed   By: Lavonia Dana M.D.   On: 02/07/2014 11:14    Review of Systems  Musculoskeletal: Positive for falls and joint pain.       Left hip only  All other systems reviewed and are negative.  Blood pressure 204/88, temperature 97.6 F (36.4 C),  temperature source Oral, resp. rate 11, height '5\' 9"'  (1.753 m), weight 66.679 kg (147 lb), SpO2 97.00%. Physical Exam  Cardiovascular: Intact distal pulses.   Musculoskeletal:  Left LE with external rotation and shortening.  Pain with attempts at motion of left leg.    Assessment/Plan: Displaced femoral  neck fracture left.  PLAN:  Left hip hemiarthroplasty  Berkeley Veldman M 02/07/2014, 12:37 PM

## 2014-02-07 NOTE — Brief Op Note (Signed)
02/07/2014  7:18 PM  PATIENT:  Carolyn [name]  80 y.o. female  PRE-OPERATIVE DIAGNOSIS:  left femoral neck fracture  POST-OPERATIVE DIAGNOSIS:  left femoral neck fracture  PROCEDURE:  Procedure(s): LEFT HIP PRESS FIT MONOPOLAR HEMIARTHROPLASTY  (Left)  SURGEON:  Surgeon(s) and Role:    * Marybelle Killings, MD - Primary  PHYSICIAN ASSISTANT:   ASSISTANTS: none   ANESTHESIA:   local and general  EBL:  Total I/O In: -  Out: 175 [Urine:125; Blood:50]  BLOOD ADMINISTERED:none  DRAINS: none   LOCAL MEDICATIONS USED:  MARCAINE     SPECIMEN:  No Specimen  DISPOSITION OF SPECIMEN:  N/A  COUNTS:  YES  TOURNIQUET:  * No tourniquets in log *  DICTATION: .Other Dictation: Dictation Number [id number]  PLAN OF CARE: already inpatient  PATIENT DISPOSITION:  PACU - hemodynamically stable.   Delay start of Pharmacological VTE agent (>24hrs) due to surgical blood loss or risk of bleeding: yes

## 2014-02-07 NOTE — H&P (Signed)
[facility] History and Physical  [name]:[id number] DOB: Jul 12, 1931 DOA: 02/07/2014  Referring physician: Cleatrice Burke, ER p.m. PCP: [name]   Chief Complaint: Fall and hip pain  HPI: Carolyn [name] is a 80 y.o. female  Patient is an 80 year old female past medical history of hypertension and hypothyroidism who at baseline is able to her without assistance and let her go about today in the yard and when she went to treat him, tripped over her stump landing on her left side. She started having severe pain and was unable to stand. By phone, paramedics regular did and patient was brought into emergency room. X-rays done in the ER noted subcapital left femoral fracture. Orthopedics were consulted and hospitalists were called for further evaluation and admission.   Review of Systems:  Patient seen after arrival to preop. Doing okay. Complains of some occasional hip pain is located on the left and is non-radiating.. Denies any headaches, vision changes, dysphagia, chest pain, palpitations, shortness of breath, wheeze, cough, abdominal pain, hematuria, dysuria, constipation, diarrhea, focal extremity numbness or weakness or pain other than in her hip as described above. Review of systems is otherwise negative.  Past Medical History  Diagnosis Date  . Hypertension   . High cholesterol   . Hypothyroidism   . Asthma     "only when I smoked"  . H/O hiatal hernia     "twice"  . GERD (gastroesophageal reflux disease)   . Arthritis     "both knees" (11/26/2013)  . Depression     "little bit" (11/26/2013)  . Anxiety     "little bit" (11/26/2013)  . COPD (chronic obstructive pulmonary disease)   . Pneumonia 11/2013   Past Surgical History  Procedure Laterality Date  . Hernia repair    . Cholecystectomy    . Bladder suspension      "w/hysterectomy"  . Hiatal hernia repair  X 2  . Abdominal hysterectomy    . Cataract extraction w/ intraocular lens  implant,  bilateral Bilateral   . Appendectomy     Social History:  reports that she has quit smoking. Her smoking use included Cigarettes. She has a 10 pack-year smoking history. She has never used smokeless tobacco. She reports that she does not drink alcohol or use illicit drugs. she lives at home by herself with family nearby  Allergies  Allergen Reactions  . Penicillins Hives    Family history: Discussed with patient. History of hypertension  Prior to Admission medications   Medication Sig Start Date End Date Taking? Authorizing Provider  ALPRAZolam Duanne Moron) 1 MG tablet Take 1 mg by mouth at bedtime as needed for anxiety.  11/01/13  Yes Historical Provider, MD  Calcium Carbonate-Vitamin D (CALTRATE 600+D) 600-400 MG-UNIT per tablet Take 1 tablet by mouth daily.   Yes Historical Provider, MD  CRESTOR 10 MG tablet Take 10 mg by mouth daily.  09/10/13  Yes Historical Provider, MD  irbesartan (AVAPRO) 300 MG tablet Take 300 mg by mouth daily.   Yes Historical Provider, MD  levothyroxine (SYNTHROID, LEVOTHROID) 25 MCG tablet Take 25 mcg by mouth daily before breakfast.   Yes Historical Provider, MD  MAGNESIUM PO Take 1 tablet by mouth daily.   Yes Historical Provider, MD  metoprolol succinate (TOPROL-XL) 50 MG 24 hr tablet Take 50 mg by mouth daily.  09/10/13  Yes Historical Provider, MD  NEXIUM 40 MG capsule Take 40 mg by mouth 2 (two) times daily before a meal.  10/03/13  Yes Historical Provider, MD  Omega-3 Fatty Acids (FISH OIL PO) Take 1 capsule by mouth daily.   Yes Historical Provider, MD  oxyCODONE-acetaminophen (PERCOCET/ROXICET) 5-325 MG per tablet 1 tablet every 6 (six) hours as needed (pain).  09/30/13  Yes Historical Provider, MD  levothyroxine (SYNTHROID, LEVOTHROID) 50 MCG tablet Take 1 tablet (50 mcg total) by mouth daily before breakfast. 11/28/13   Melton Alar, PA-C   Physical Exam: Filed Vitals:   02/07/14 1612  BP: 173/85  Pulse: 104  Temp: 98.8 F (37.1 C)  Resp: 18    BP  173/85  Pulse 104  Temp(Src) 98.8 F (37.1 C) (Oral)  Resp 18  Ht 5\' 9"  (1.753 m)  Wt 66.679 kg (147 lb)  BMI 21.70 kg/m2  SpO2 100%  General:  Appears calm and comfortable Eyes: Sclera nonicteric, extraocular movements are intact ENT: Normocephalic and atraumatic mucous membranes are dry Neck: No JVD Cardiovascular: Regular rate and rhythm, Y4-I3, 2/6 systolic ejection Respiratory: Clear to auscultation bilaterally Abdomen: Soft, nontender, nondistended, positive bowel sounds Skin: Skin breaks, tears or lesions Musculoskeletal: No clubbing or cyanosis, trace edema. Further musculoskeletal exam was deferred secondary to fracture Psychiatric: Patient is appropriate, no evidence of psychoses Neurologic: No focal deficits           Labs on Admission:  Basic Metabolic Panel:  Recent Labs Lab 02/07/14 1031  NA 139  K 4.5  CL 101  CO2 25  GLUCOSE 111*  BUN 21  CREATININE 0.78  CALCIUM 9.7   Liver Function Tests: No results found for this basename: AST, ALT, ALKPHOS, BILITOT, PROT, ALBUMIN,  in the last 168 hours No results found for this basename: LIPASE, AMYLASE,  in the last 168 hours No results found for this basename: AMMONIA,  in the last 168 hours CBC:  Recent Labs Lab 02/07/14 1031  WBC 7.2  NEUTROABS 5.6  HGB 12.2  HCT 38.2  MCV 93.2  PLT 221   Cardiac Enzymes: No results found for this basename: CKTOTAL, CKMB, CKMBINDEX, TROPONINI,  in the last 168 hours  BNP (last 3 results) No results found for this basename: PROBNP,  in the last 8760 hours CBG: No results found for this basename: GLUCAP,  in the last 168 hours  Radiological Exams on Admission: Dg Chest 1 View  02/07/2014   CLINICAL DATA:  Status post left hip fracture  EXAM: CHEST - 1 VIEW  COMPARISON:  PA and lateral chest of Dec 09, 2013  FINDINGS: The lungs remain hyperinflated. There is stable blunting of the left lateral costophrenic angle. The heart is normal in size. The pulmonary  vascularity is normal. The mediastinum is normal in width. There is a small hiatal hernia. There is curvature of the thoracolumbar spine with the convexity towards the right. The observed portions of the ribs are normal.  IMPRESSION: COPD and chronic changes at the left base. There is no acute cardiopulmonary abnormality.   Electronically Signed   By: David  Martinique   On: 02/07/2014 11:13   Dg Hip Complete Left  02/07/2014   CLINICAL DATA:  LEFT hip pain and injury post fall  EXAM: LEFT HIP - COMPLETE 2+ VIEW  COMPARISON:  09/30/2013  FINDINGS: Diffuse osseous demineralization.  Displaced subcapital fracture LEFT femoral neck.  No dislocation.  Pelvis appears intact.  Symmetric hip and SI joints.  Scattered vascular calcification.  IMPRESSION: Displaced subcapital fracture of LEFT femoral neck, new.   Electronically Signed   By: Crist Infante.D.  On: 02/07/2014 11:14    EKG: Independently reviewed. Normal sinus rhythm  Assessment/Plan Principal Problem:   Hip fracture: Clear for surgery. Orthopedic taking patient tonight. Watch for acute blood loss anemia. Skilled nursing facility short-term likely Monday Active Problems:   Unspecified hypothyroidism: Once by mouth resumed, restart Synthroid   Essential hypertension, malignant: The pressure initially moderate and then elevated with pain. Will use when necessary hydralazine   GERD (gastroesophageal reflux disease): On PPI   Other and unspecified hyperlipidemia: Continue statin   Code Status: Full code Family Communication: Left message with son Disposition Plan: Surgery tonight, likely skilled nursing facility Monday  Time spent: 35 minutes  [facility] Pager [phone]  **Disclaimer: This note may have been dictated with voice recognition software. Similar sounding words can inadvertently be transcribed and this note may contain transcription errors which may not have been corrected upon publication of note.**

## 2014-02-07 NOTE — Progress Notes (Signed)
Patient ID: Carolyn [name], female   DOB: 11/03/1930, 80 y.o.   MRN: [id number] Left femoral neck fracture. Posted for left hip monopolar hemiarthroplasty today at 5:30 PM. Discussed operative plan , risks , therapy, WBAT post op etc. All ?'s answered. Full consult to follow.

## 2014-02-07 NOTE — ED Provider Notes (Signed)
Patient tripped and fell at her home 8 a.m. today. Complains of left sided nonradiating hip pain since the event. No other injury . On exam alert Glasgow Coma Score 15. Left lower stomach shortened and externally rotated. DP pulses 1+  Orlie Dakin, MD 02/07/14 1029

## 2014-02-07 NOTE — ED Provider Notes (Signed)
CSN: [id number]     Arrival date & time 02/07/14  1011 History   First MD Initiated Contact with Patient 02/07/14 1014     Chief Complaint  Patient presents with  . Fall     (Consider location/radiation/quality/duration/timing/severity/associated sxs/prior Treatment) HPI Comments: The patient is an 80 year old female with history of hypertension, high cholesterol, hypothyroidism, asthma, GERD, COPD who presents today after a fall. She reports that she was letting her dog out when she tripped on a stump. She did not hit her head. There was no loss of consciousness. She fell backwards and hit her left hip. She has pain with any sort of movement of her left leg. She is not receive any pain medication via EMS. Patient is not on any blood thinners. She reports that she has not eaten anything today. She sees Dr. Marlou Sa of [facility] orthopedics. She is scheduled to have a total knee replacement. She has never had any surgeries on this hip. She denies pain anywhere except her left hip.  Patient is a 80 y.o. female presenting with fall. The history is provided by the patient. No language interpreter was used.  Fall Associated symptoms include arthralgias and myalgias. Pertinent negatives include no abdominal pain, chest pain, chills, fever, nausea or vomiting.    Past Medical History  Diagnosis Date  . Hypertension   . High cholesterol   . Hypothyroidism   . Asthma     "only when I smoked"  . H/O hiatal hernia     "twice"  . GERD (gastroesophageal reflux disease)   . Arthritis     "both knees" (11/26/2013)  . Depression     "little bit" (11/26/2013)  . Anxiety     "little bit" (11/26/2013)  . COPD (chronic obstructive pulmonary disease)    Past Surgical History  Procedure Laterality Date  . Hernia repair    . Cholecystectomy    . Bladder suspension      "w/hysterectomy"  . Hiatal hernia repair  X 2  . Abdominal hysterectomy    . Cataract extraction w/ intraocular lens  implant, bilateral  Bilateral   . Appendectomy     No family history on file. History  Substance Use Topics  . Smoking status: Former Smoker -- 0.25 packs/day for 40 years    Types: Cigarettes  . Smokeless tobacco: Never Used     Comment: "quit smoking ~ 2005"  . Alcohol Use: No     Comment: 11/26/2013 "drank a little alcohol; last drink was a long time ago"   OB History   Grav Para Term Preterm Abortions TAB SAB Ect Mult Living                 Review of Systems  Constitutional: Negative for fever and chills.  Respiratory: Negative for shortness of breath.   Cardiovascular: Negative for chest pain.  Gastrointestinal: Negative for nausea, vomiting and abdominal pain.  Musculoskeletal: Positive for arthralgias, gait problem and myalgias.  All other systems reviewed and are negative.     Allergies  Penicillins  Home Medications   Prior to Admission medications   Medication Sig Start Date End Date Taking? Authorizing Provider  ALPRAZolam Duanne Moron) 1 MG tablet Take 1 mg by mouth at bedtime as needed for anxiety.  11/01/13  Yes Historical Provider, MD  Calcium Carbonate-Vitamin D (CALTRATE 600+D) 600-400 MG-UNIT per tablet Take 1 tablet by mouth daily.   Yes Historical Provider, MD  CRESTOR 10 MG tablet Take 10 mg by mouth daily.  09/10/13  Yes Historical Provider, MD  irbesartan (AVAPRO) 300 MG tablet Take 300 mg by mouth daily.   Yes Historical Provider, MD  levothyroxine (SYNTHROID, LEVOTHROID) 25 MCG tablet Take 25 mcg by mouth daily before breakfast.   Yes Historical Provider, MD  levothyroxine (SYNTHROID, LEVOTHROID) 50 MCG tablet Take 1 tablet (50 mcg total) by mouth daily before breakfast. 11/28/13  Yes Bobby Rumpf York, PA-C  MAGNESIUM PO Take 1 tablet by mouth daily.   Yes Historical Provider, MD  metoprolol succinate (TOPROL-XL) 50 MG 24 hr tablet Take 50 mg by mouth daily.  09/10/13  Yes Historical Provider, MD  NEXIUM 40 MG capsule Take 40 mg by mouth 2 (two) times daily before a meal.  10/03/13   Yes Historical Provider, MD  Omega-3 Fatty Acids (FISH OIL PO) Take 1 capsule by mouth daily.   Yes Historical Provider, MD  oxyCODONE-acetaminophen (PERCOCET/ROXICET) 5-325 MG per tablet 1 tablet every 6 (six) hours as needed (pain).  09/30/13  Yes Historical Provider, MD   BP 158/71  Temp(Src) 97.6 F (36.4 C) (Oral)  Resp 15  Ht 5\' 9"  (1.753 m)  Wt 147 lb (66.679 kg)  BMI 21.70 kg/m2  SpO2 97% Physical Exam  Nursing note and vitals reviewed. Constitutional: She is oriented to person, place, and time. She appears well-developed and well-nourished. No distress.  HENT:  Head: Normocephalic and atraumatic.  Right Ear: External ear normal.  Left Ear: External ear normal.  Nose: Nose normal.  Mouth/Throat: Oropharynx is clear and moist.  No broken or loose teeth  Eyes: Conjunctivae and EOM are normal. Pupils are equal, round, and reactive to light.  Neck: Normal range of motion. No spinous process tenderness and no muscular tenderness present.  Cardiovascular: Normal rate, regular rhythm, normal heart sounds, intact distal pulses and normal pulses.   Pulses:      Dorsalis pedis pulses are 2+ on the right side, and 2+ on the left side.       Posterior tibial pulses are 2+ on the right side, and 2+ on the left side.  Capillary refill 3 seconds in all toes  Pulmonary/Chest: Effort normal and breath sounds normal. No stridor. No respiratory distress. She has no wheezes. She has no rales.  Abdominal: Soft. She exhibits no distension. There is no tenderness.  Musculoskeletal: Normal range of motion.  Pelvis stable.  Tender to palpation over left hip. Patient able to wiggle all toes. ROM in left leg limited. External rotation and shortening of left leg.   Neurological: She is alert and oriented to person, place, and time. She has normal strength. No sensory deficit. GCS eye subscore is 4. GCS verbal subscore is 5. GCS motor subscore is 6.  Sensation intact throughout left leg.   Skin: Skin is  warm and dry. She is not diaphoretic. No erythema.  Psychiatric: She has a normal mood and affect. Her behavior is normal.    ED Course  Procedures (including critical care time) Labs Review Labs Reviewed  BASIC METABOLIC PANEL - Abnormal; Notable for the following:    Glucose, Bld 111 (*)    GFR calc non Af Amer 77 (*)    GFR calc Af Amer 89 (*)    All other components within normal limits  CBC WITH DIFFERENTIAL - Abnormal; Notable for the following:    Neutrophils Relative % 79 (*)    All other components within normal limits  URINE CULTURE  PROTIME-INR  URINALYSIS, ROUTINE W REFLEX MICROSCOPIC  TYPE AND  SCREEN  ABO/RH    Imaging Review Dg Chest 1 View  02/07/2014   CLINICAL DATA:  Status post left hip fracture  EXAM: CHEST - 1 VIEW  COMPARISON:  PA and lateral chest of Dec 09, 2013  FINDINGS: The lungs remain hyperinflated. There is stable blunting of the left lateral costophrenic angle. The heart is normal in size. The pulmonary vascularity is normal. The mediastinum is normal in width. There is a small hiatal hernia. There is curvature of the thoracolumbar spine with the convexity towards the right. The observed portions of the ribs are normal.  IMPRESSION: COPD and chronic changes at the left base. There is no acute cardiopulmonary abnormality.   Electronically Signed   By: David  Martinique   On: 02/07/2014 11:13   Dg Hip Complete Left  02/07/2014   CLINICAL DATA:  LEFT hip pain and injury post fall  EXAM: LEFT HIP - COMPLETE 2+ VIEW  COMPARISON:  09/30/2013  FINDINGS: Diffuse osseous demineralization.  Displaced subcapital fracture LEFT femoral neck.  No dislocation.  Pelvis appears intact.  Symmetric hip and SI joints.  Scattered vascular calcification.  IMPRESSION: Displaced subcapital fracture of LEFT femoral neck, new.   Electronically Signed   By: Lavonia Dana M.D.   On: 02/07/2014 11:14     EKG Interpretation   Date/Time:  Friday February 07 2014 10:25:38 EDT Ventricular Rate:   74 PR Interval:  186 QRS Duration: 83 QT Interval:  397 QTC Calculation: 440 R Axis:   57 Text Interpretation:  Sinus rhythm Anteroseptal infarct, age indeterminate  Minimal ST elevation, inferior leads No significant change since last  tracing Confirmed by Winfred Leeds  MD, SAM [phone]) on 02/07/2014 10:35:21 AM      MDM   Final diagnoses:  Hip fracture, left, closed, initial encounter  Essential hypertension    11:45 AM Discussed case with Dr. Lorin Mercy who will see patient in ED. Likely surgery later today. Will admit to medicine.   Patient presents to ED after having a mechanical fall. She tripped over a stump while letting her dogs out this morning. Patient with displaced subcapital fracture of the left femoral neck. Neurovascularly intact, compartment soft. Patient is NPO. Admitted to medicine. Patient is scheduled for OR at 530 today. Admission and consultation are appreciated. Dr. Winfred Leeds evaluated patient and agrees with plan. Patient / Family / Caregiver informed of clinical course, understand medical decision-making process, and agree with plan.   Elwyn Lade, PA-C 02/07/14 1317

## 2014-02-07 NOTE — Anesthesia Preprocedure Evaluation (Addendum)
Anesthesia Evaluation  Patient identified by MRN, date of birth, ID band Patient awake    Reviewed: Allergy & Precautions, H&P , NPO status , Patient's Chart, lab work & pertinent test results  History of Anesthesia Complications Negative for: history of anesthetic complications  Airway Mallampati: I TM Distance: >3 FB Neck ROM: Full    Dental  (+) Edentulous Upper, Edentulous Lower, Dental Advisory Given   Pulmonary asthma , COPDformer smoker,    Pulmonary exam normal       Cardiovascular hypertension, Pt. on home beta blockers and Pt. on medications     Neuro/Psych    GI/Hepatic Neg liver ROS, hiatal hernia, GERD-  ,  Endo/Other  Hypothyroidism   Renal/GU negative Renal ROS     Musculoskeletal   Abdominal   Peds  Hematology   Anesthesia Other Findings   Reproductive/Obstetrics                          Anesthesia Physical Anesthesia Plan  ASA: III  Anesthesia Plan: General   Post-op Pain Management:    Induction: Intravenous  Airway Management Planned: Oral ETT  Additional Equipment:   Intra-op Plan:   Post-operative Plan: Extubation in OR  Informed Consent: I have reviewed the patients History and Physical, chart, labs and discussed the procedure including the risks, benefits and alternatives for the proposed anesthesia with the patient or authorized representative who has indicated his/her understanding and acceptance.   Dental advisory given  Plan Discussed with: CRNA, Anesthesiologist and Surgeon  Anesthesia Plan Comments:        Anesthesia Quick Evaluation

## 2014-02-07 NOTE — Interval H&P Note (Signed)
History and Physical Interval Note:  02/07/2014 5:58 PM  Carolyn [name]  has presented today for surgery, with the diagnosis of left femoral neck fracture  The various methods of treatment have been discussed with the patient and family. After consideration of risks, benefits and other options for treatment, the patient has consented to  Procedure(s): LEFT HIP PRESS FIT MONOPOLAR HEMIARTHROPLASTY  (Left) as a surgical intervention .  The patient's history has been reviewed, patient examined, no change in status, stable for surgery.  I have reviewed the patient's chart and labs.  Questions were answered to the patient's satisfaction.     Martrice Apt C

## 2014-02-07 NOTE — Anesthesia Postprocedure Evaluation (Signed)
  Anesthesia Post-op Note  Patient: Carolyn [name]  Procedure(s) Performed: Procedure(s): LEFT HIP PRESS FIT MONOPOLAR HEMIARTHROPLASTY  (Left)  Patient Location: PACU  Anesthesia Type:General  Level of Consciousness: awake, oriented, sedated and patient cooperative  Airway and Oxygen Therapy: Patient Spontanous Breathing  Post-op Pain: mild  Post-op Assessment: Post-op Vital signs reviewed, Patient's Cardiovascular Status Stable, Respiratory Function Stable, Patent Airway and No signs of Nausea or vomiting  Post-op Vital Signs: stable  Last Vitals:  Filed Vitals:   02/07/14 2029  BP: 157/80  Pulse: 77  Temp: 37.1 C  Resp: 16    Complications: No apparent anesthesia complications

## 2014-02-07 NOTE — Transfer of Care (Signed)
Immediate Anesthesia Transfer of Care Note  Patient: Carolyn [name]  Procedure(s) Performed: Procedure(s): LEFT HIP PRESS FIT MONOPOLAR HEMIARTHROPLASTY  (Left)  Patient Location: PACU  Anesthesia Type:General  Level of Consciousness: awake and alert   Airway & Oxygen Therapy: Patient Spontanous Breathing and Patient connected to nasal cannula oxygen  Post-op Assessment: Report given to PACU RN and Post -op Vital signs reviewed and stable  Post vital signs: Reviewed and stable  Complications: No apparent anesthesia complications

## 2014-02-07 NOTE — ED Notes (Signed)
Patient states was out in her back yard this morning and slipped on a tree stump and fell backwards.  Denies hitting head or LOC.   Patient states that she did hit hard on her L hip.   Per EMS, outward rotation of L leg.   Patient does complain of pain to L hip.

## 2014-02-08 LAB — BASIC METABOLIC PANEL
Anion gap: 16 — ABNORMAL HIGH (ref 5–15)
BUN: 20 mg/dL (ref 6–23)
CALCIUM: 9 mg/dL (ref 8.4–10.5)
CO2: 22 mEq/L (ref 19–32)
Chloride: 98 mEq/L (ref 96–112)
Creatinine, Ser: 0.77 mg/dL (ref 0.50–1.10)
GFR, EST AFRICAN AMERICAN: 90 mL/min — AB (ref >90)
GFR, EST NON AFRICAN AMERICAN: 77 mL/min — AB (ref >90)
GLUCOSE: 100 mg/dL — AB (ref 70–99)
Potassium: 4.7 mEq/L (ref 3.7–5.3)
Sodium: 136 mEq/L — ABNORMAL LOW (ref 137–147)

## 2014-02-08 LAB — URINE CULTURE
CULTURE: NO GROWTH
Colony Count: NO GROWTH

## 2014-02-08 LAB — CBC
HCT: 37.6 % (ref 36.0–46.0)
HEMOGLOBIN: 12 g/dL (ref 12.0–15.0)
MCH: 29.5 pg (ref 26.0–34.0)
MCHC: 31.9 g/dL (ref 30.0–36.0)
MCV: 92.4 fL (ref 78.0–100.0)
Platelets: 222 10*3/uL (ref 150–400)
RBC: 4.07 MIL/uL (ref 3.87–5.11)
RDW: 14.6 % (ref 11.5–15.5)
WBC: 10.3 10*3/uL (ref 4.0–10.5)

## 2014-02-08 NOTE — Progress Notes (Signed)
Patient Demographics  Carolyn [name], is a 80 y.o. female, DOB - Apr 18, 1931, HOZ:[id number]  Admit date - 02/07/2014   Admitting Physician Annita Brod, MD  Outpatient Primary MD for the patient is [name]  LOS - 1   Chief Complaint  Patient presents with  . Fall        Subjective:   [name] today has, No headache, No chest pain, No abdominal pain - No Nausea, No new weakness tingling or numbness, No Cough - SOB.      Assessment & Plan   1. Mechanical fall with closed left displaced subcapital femoral neck fracture - status post open reduction internal fixation on 02/08/2014 morning by Dr. Lorin Mercy, as tolerated procedure well, initiate PT. Weight bearing as tolerated, aspirin for DVT prophylaxis per orthopedics. May require placement.   2. Essential hypertension. On comminution of beta blocker and ARB which will be continued. Monitor blood pressure.   3. GERD on PPI. Had mild nausea this morning secondary to narcotics, resolved, supportive care with Zofran as needed.   4. Dyslipidemia continue statin.   5. Hypothyroidism. Continue Synthroid.        Code Status: Full  Family Communication: none present  Disposition Plan: TBD   Procedures ORIF 02-08-14 Dr Lorin Mercy    Consults  Ortho   Medications  Scheduled Meds: . aspirin EC  325 mg Oral Q breakfast  . atorvastatin  20 mg Oral q1800  . calcium-vitamin D  1 tablet Oral Q breakfast  . docusate sodium  100 mg Oral BID  . irbesartan  300 mg Oral Daily  . levothyroxine  25 mcg Oral QAC breakfast  . metoprolol succinate  50 mg Oral Daily  . pantoprazole  80 mg Oral Q1200   Continuous Infusions: . sodium chloride     PRN Meds:.acetaminophen, ALPRAZolam, bisacodyl, fentaNYL,  HYDROcodone-acetaminophen, menthol-cetylpyridinium, metoprolol, morphine injection, ondansetron (ZOFRAN) IV, oxyCODONE-acetaminophen, polyethylene glycol  DVT Prophylaxis  ASA  Lab Results  Component Value Date   PLT 222 02/08/2014    Antibiotics     Anti-infectives   Start     Dose/Rate Route Frequency Ordered Stop   02/07/14 1801  ceFAZolin (ANCEF) 2-3 GM-% IVPB SOLR    Comments:  O'Laughlin, Karen   : cabinet override      02/07/14 1801 02/07/14 1814   02/07/14 1800  ceFAZolin (ANCEF) IVPB 2 g/50 mL premix  Status:  Discontinued     2 g 100 mL/hr over 30 Minutes Intravenous  Once 02/07/14 1757 02/07/14 2026          Objective:   Filed Vitals:   02/08/14 0400 02/08/14 0559 02/08/14 0800 02/08/14 0911  BP:  137/82  148/78  Pulse:  98  97  Temp:  99.8 F (37.7 C)  99.5 F (37.5 C)  TempSrc:  Oral  Oral  Resp: 20 17 16 16   Height:      Weight:      SpO2: 96% 96% 100% 100%    Wt Readings from Last 3 Encounters:  02/07/14 66.679 kg (147 lb)  02/07/14 66.679 kg (147 lb)  11/28/13 67.858 kg (149 lb 9.6 oz)     Intake/Output Summary (Last 24 hours) at 02/08/14 1108 Last data filed at 02/08/14  0607  Gross per 24 hour  Intake    775 ml  Output    925 ml  Net   -150 ml     Physical Exam  Awake Alert, Oriented X 3, No new F.N deficits, Normal affect [place].AT,PERRAL Supple Neck,No JVD, No cervical lymphadenopathy appriciated.  Symmetrical Chest wall movement, Good air movement bilaterally, CTAB RRR,No Gallops,Rubs or new Murmurs, No Parasternal Heave +ve B.Sounds, Abd Soft, No tenderness, No organomegaly appriciated, No rebound - guarding or rigidity. No Cyanosis, Clubbing or edema, No new Rash or bruise     Data Review   Micro Results Recent Results (from the past 240 hour(s))  URINE CULTURE     Status: None   Collection Time    02/07/14 11:20 AM      Result Value Ref Range Status   Specimen Description URINE, CATHETERIZED   Final   Special Requests NONE    Final   Culture  Setup Time     Final   Value: 02/07/2014 12:17     Performed at [place]     Final   Value: NO GROWTH     Performed at [facility]   Culture     Final   Value: NO GROWTH     Performed at [facility]   Report Status 02/08/2014 FINAL   Final  SURGICAL PCR SCREEN     Status: None   Collection Time    02/07/14  3:54 PM      Result Value Ref Range Status   MRSA, PCR NEGATIVE  NEGATIVE Final   Staphylococcus aureus NEGATIVE  NEGATIVE Final   Comment:            The Xpert SA Assay (FDA     approved for NASAL specimens     in patients over 92 years of age),     is one component of     a comprehensive surveillance     program.  Test performance has     been validated by [employer] for patients greater     than or equal to 22 year old.     It is not intended     to diagnose infection nor to     guide or monitor treatment.    Radiology Reports Dg Chest 1 View  02/07/2014   CLINICAL DATA:  Status post left hip fracture  EXAM: CHEST - 1 VIEW  COMPARISON:  PA and lateral chest of Dec 09, 2013  FINDINGS: The lungs remain hyperinflated. There is stable blunting of the left lateral costophrenic angle. The heart is normal in size. The pulmonary vascularity is normal. The mediastinum is normal in width. There is a small hiatal hernia. There is curvature of the thoracolumbar spine with the convexity towards the right. The observed portions of the ribs are normal.  IMPRESSION: COPD and chronic changes at the left base. There is no acute cardiopulmonary abnormality.   Electronically Signed   By: David  Martinique   On: 02/07/2014 11:13   Dg Hip Complete Left  02/07/2014   CLINICAL DATA:  LEFT hip pain and injury post fall  EXAM: LEFT HIP - COMPLETE 2+ VIEW  COMPARISON:  09/30/2013  FINDINGS: Diffuse osseous demineralization.  Displaced subcapital fracture LEFT femoral neck.  No dislocation.  Pelvis appears intact.  Symmetric hip and SI  joints.  Scattered vascular calcification.  IMPRESSION: Displaced subcapital fracture of LEFT femoral neck, new.  Electronically Signed   By: Lavonia Dana M.D.   On: 02/07/2014 11:14   Dg Pelvis Portable  02/07/2014   CLINICAL DATA:  Postop left hip replacement  EXAM: PORTABLE PELVIS 1-2 VIEWS  COMPARISON:  None.  FINDINGS: There is left hip arthroplasty without hardware failure or complication. There is no fracture or dislocation. There are postsurgical changes in the surrounding soft tissues.  IMPRESSION: Left hip arthroplasty without hardware failure, complication or dislocation.   Electronically Signed   By: Kathreen Devoid   On: 02/07/2014 19:58    CBC  Recent Labs Lab 02/07/14 1031 02/08/14 0415  WBC 7.2 10.3  HGB 12.2 12.0  HCT 38.2 37.6  PLT 221 222  MCV 93.2 92.4  MCH 29.8 29.5  MCHC 31.9 31.9  RDW 14.4 14.6  LYMPHSABS 1.0  --   MONOABS 0.4  --   EOSABS 0.1  --   BASOSABS 0.0  --     Chemistries   Recent Labs Lab 02/07/14 1031 02/08/14 0415  NA 139 136*  K 4.5 4.7  CL 101 98  CO2 25 22  GLUCOSE 111* 100*  BUN 21 20  CREATININE 0.78 0.77  CALCIUM 9.7 9.0   ------------------------------------------------------------------------------------------------------------------ estimated creatinine clearance is 58.6 ml/min (by C-G formula based on Cr of 0.77). ------------------------------------------------------------------------------------------------------------------ No results found for this basename: HGBA1C,  in the last 72 hours ------------------------------------------------------------------------------------------------------------------ No results found for this basename: CHOL, HDL, LDLCALC, TRIG, CHOLHDL, LDLDIRECT,  in the last 72 hours ------------------------------------------------------------------------------------------------------------------ No results found for this basename: TSH, T4TOTAL, FREET3, T3FREE, THYROIDAB,  in the last 72  hours ------------------------------------------------------------------------------------------------------------------ No results found for this basename: VITAMINB12, FOLATE, FERRITIN, TIBC, IRON, RETICCTPCT,  in the last 72 hours  Coagulation profile  Recent Labs Lab 02/07/14 1031  INR 1.01    No results found for this basename: DDIMER,  in the last 72 hours  Cardiac Enzymes No results found for this basename: CK, CKMB, TROPONINI, MYOGLOBIN,  in the last 168 hours ------------------------------------------------------------------------------------------------------------------ No components found with this basename: POCBNP,      Time Spent in minutes   35   Arena Lindahl K M.D on 02/08/2014 at 11:08 AM  Between 7am to 7pm - Pager - [phone]  After 7pm go to [email or web address] - password [email or web address]  And look for the night coverage person covering for me after hours  [facility] Office  [phone]   **Disclaimer: This note may have been dictated with voice recognition software. Similar sounding words can inadvertently be transcribed and this note may contain transcription errors which may not have been corrected upon publication of note.**

## 2014-02-08 NOTE — Evaluation (Signed)
I have read and agree with this note.   Time GG/EZM:6294-7654 Total time:29 minutes (EV, [place])  [place], OTR/L [phone]

## 2014-02-08 NOTE — Evaluation (Signed)
Physical Therapy Evaluation Patient Details Name: Carolyn [name] MRN: [id number] DOB: 06-14-1931 Today's Date: 02/08/2014   History of Present Illness  Pt is a 80 yo female who fell at home this am while in the yard on 7/17. Radiographs show displaced left femoral neck fracture. Pt s/p left post hip hemiarthroplasty.   Clinical Impression  Pt admitted with above. Pt lives alone but states she wants to go home and have HHPT and that her daughter can stay with her 24/7. Pt tolerated OOB mobility well this date despite nausea and feel that pt with progress well enough to return home with 24/7 supervision, use of RW and HHPT.    Follow Up Recommendations Home health PT;Supervision/Assistance - 24 hour    Equipment Recommendations  None recommended by PT    Recommendations for Other Services       Precautions / Restrictions Precautions Precautions: Posterior Hip;Fall Precaution Booklet Issued: Yes (comment) Precaution Comments: pt with verbal understanding Required Braces or Orthoses: Knee Immobilizer - Left Knee Immobilizer - Left:  (in bed) Restrictions Weight Bearing Restrictions: Yes LLE Weight Bearing: Weight bearing as tolerated      Mobility  Bed Mobility Overal bed mobility: Needs Assistance Bed Mobility: Supine to Sit     Supine to sit: Mod assist     General bed mobility comments: max directional v/c's, assist for L LE and trunk elevation  Transfers Overall transfer level: Needs assistance Equipment used: Rolling walker (2 wheeled) Transfers: Sit to/from Stand Sit to Stand: Min assist         General transfer comment: v/c's for safe hand placement  Ambulation/Gait Ambulation/Gait assistance: Min assist Ambulation Distance (Feet): 5 Feet Assistive device: Rolling walker (2 wheeled) Gait Pattern/deviations: Step-to pattern;Decreased stride length;Decreased step length - left;Decreased stance time - left     General Gait Details: v/c's for  sequencing  Stairs            Wheelchair Mobility    Modified Rankin (Stroke Patients Only)       Balance Overall balance assessment: Needs assistance   Sitting balance-Leahy Scale: Fair     Standing balance support: During functional activity Standing balance-Leahy Scale: Poor Standing balance comment: requires RW due to L LE s/p surgery                             Pertinent Vitals/Pain 6/10 L LE    Home Living Family/patient expects to be discharged to:: Private residence Living Arrangements: Alone Available Help at Discharge: Family;Available 24 hours/day Type of Home: House Home Access: Stairs to enter Entrance Stairs-Rails: Right Entrance Stairs-Number of Steps: 2 Home Layout: One level Home Equipment: Cane - single point;Walker - 2 wheels;Bedside commode Additional Comments: pt reports having HHPT back in may and daughter stayed with her for a month.    Prior Function Level of Independence: Independent               Hand Dominance   Dominant Hand: Right    Extremity/Trunk Assessment   Upper Extremity Assessment: Overall WFL for tasks assessed           Lower Extremity Assessment: LLE deficits/detail   LLE Deficits / Details: tolerated 45 deg of L hip AAROM flexion, pt able to tolerate WBing  Cervical / Trunk Assessment: Normal  Communication   Communication: No difficulties  Cognition Arousal/Alertness: Awake/alert Behavior During Therapy: WFL for tasks assessed/performed Overall Cognitive Status: Within Functional Limits for tasks  assessed                      General Comments      Exercises        Assessment/Plan    PT Assessment Patient needs continued PT services  PT Diagnosis Difficulty walking;Acute pain   PT Problem List Decreased strength;Decreased activity tolerance;Decreased balance;Decreased mobility;Decreased range of motion  PT Treatment Interventions DME instruction;Gait training;Stair  training;Functional mobility training;Therapeutic activities;Therapeutic exercise   PT Goals (Current goals can be found in the Care Plan section) Acute Rehab PT Goals Patient Stated Goal: go home not SNF PT Goal Formulation: With patient Time For Goal Achievement: 02/15/14 Potential to Achieve Goals: Good    Frequency Min 5X/week   Barriers to discharge        Co-evaluation               End of Session Equipment Utilized During Treatment: Gait belt Activity Tolerance: Patient tolerated treatment well Patient left: in chair;with call bell/phone within reach;with chair alarm set Nurse Communication: Mobility status         Time: 1224-8250 PT Time Calculation (min): 29 min   Charges:   PT Evaluation $Initial PT Evaluation Tier I: 1 Procedure PT Treatments $Gait Training: 8-22 mins   PT G CodesKingsley Callander 02/08/2014, 10:11 AM  Kittie Plater, PT, DPT Pager #: [phone] Office #: [phone]'

## 2014-02-08 NOTE — Op Note (Signed)
[name], [name] NO.:  [id number]  MEDICAL RECORD NO.:  [id number]  LOCATION:  [id number]                        FACILITY:  [facility]  PHYSICIAN:  Keshon Markovitz C. Lorin Mercy, M.D.    DATE OF BIRTH:  [birth year]  DATE OF PROCEDURE:  02/07/2014 DATE OF DISCHARGE:                              OPERATIVE REPORT   PREOPERATIVE DIAGNOSIS:  Displaced left femoral neck fracture.  POSTOPERATIVE DIAGNOSIS:  Displaced left femoral neck fracture.  PROCEDURE:  Press-fit monopolar hemiarthroplasty DePuy 50-mm ball +0 neck, #4 [place] Basic stem.  SURGEON:  Donovan Persley C. Lorin Mercy, MD  ANESTHESIA:  General.  ESTIMATED BLOOD LOSS:  100 mL.  DRAINS:  None.  COMPLICATIONS:  None.  DESCRIPTION OF PROCEDURE:  After induction of general anesthesia, preoperative Ancef prophylaxis 2 g, the patient was placed in a lateral position, axillary roll, Jaiyla Granados 7 frame, hip stabilizer, prepped with DuraPrep, usual split sheets, drapes, impervious stockinette, Coban, sterile skin marker, Betadine, Steri-Drape x2 was used to seal the skin. Time-out procedure completed.  Posterior approach was made.  Gluteus maximus was split in line with the fibers.  Charnley retractor placed. Piriformis tagged, cut, posterior capsule opened.  Head was removed with a corkscrew measured at 50 mm.  Neck cut 1 fingerbreadth above the lesser trochanter.  Excessive bone removed, canal starter reamer, lateralized sequential broaching up to a 4 which gave excellent tight fit.  Touch up with a lateralizer to make sure the stem was in neutral position and not in varus.  Once it was down, trial +0 ball was placed. Excellent suction fit.  Good restoration of length, good stability. Flexion 90 degrees, internal rotation to 80 degrees.  Permanent ball was inserted with the +0 trunnion.  All soft tissue was out of the socket. Sciatic nerve was protected once again.  The hip was reduced.  Identical findings of stability, good restoration of leg  length, closure piriformis, gluteus medius.  Tensor fascia repaired with #1 Ethibond and #1 Vicryl sutures, #1 Vicryl in the gluteus maximus fascia, 2-0 on the subcutaneous tissue, skin staple closure.  Postop dressing and knee immobilizer.  Instrument count and needle count was correct.     Corrie Brannen C. Lorin Mercy, M.D.     MCY/MEDQ  D:  02/07/2014  T:  02/08/2014  Job:  [id number]

## 2014-02-08 NOTE — Progress Notes (Signed)
Subjective: 1 Day Post-Op Procedure(s) (LRB): LEFT HIP PRESS FIT MONOPOLAR HEMIARTHROPLASTY  (Left) Patient reports pain as moderate.    Objective: Vital signs in last 24 hours: Temp:  [97.6 F (36.4 C)-99.8 F (37.7 C)] 99.5 F (37.5 C) (07/18 0911) Pulse Rate:  [71-104] 97 (07/18 0911) Resp:  [10-20] 16 (07/18 0911) BP: (117-204)/(59-99) 148/78 mmHg (07/18 0911) SpO2:  [91 %-100 %] 100 % (07/18 0911) Weight:  [66.679 kg (147 lb)] 66.679 kg (147 lb) (07/17 1013)  Intake/Output from previous day: 07/17 0701 - 07/18 0700 In: 775 [I.V.:775] Out: 925 [Urine:875; Blood:50] Intake/Output this shift:     Recent Labs  02/07/14 1031 02/08/14 0415  HGB 12.2 12.0    Recent Labs  02/07/14 1031 02/08/14 0415  WBC 7.2 10.3  RBC 4.10 4.07  HCT 38.2 37.6  PLT 221 222    Recent Labs  02/07/14 1031 02/08/14 0415  NA 139 136*  K 4.5 4.7  CL 101 98  CO2 25 22  BUN 21 20  CREATININE 0.78 0.77  GLUCOSE 111* 100*  CALCIUM 9.7 9.0    Recent Labs  02/07/14 1031  INR 1.01    Neurologically intact  Assessment/Plan: 1 Day Post-Op Procedure(s) (LRB): LEFT HIP PRESS FIT MONOPOLAR HEMIARTHROPLASTY  (Left) Plan   Standing with therapy and walking some in her room. 1st time up.  Likely home with HHPT and daughter.   Tyger Oka C 02/08/2014, 9:33 AM

## 2014-02-08 NOTE — Evaluation (Signed)
Occupational Therapy Evaluation Patient Details Name: Carolyn [name] MRN: [id number] DOB: [birth year] Today's Date: 02/08/2014    History of Present Illness Pt is a 80 yo female who fell at home this am while in the yard on 7/17. Radiographs show displaced left femoral neck fracture. Pt s/p left post hip hemiarthroplasty.    Clinical Impression   Pta pt was independent with self care tasks and now presents with generalized weakness, acute pain and posterior hip precautions interfering with her independence with ADLs. Educated pt on LB bathing/dressing sequence and technique, and on available AE to assist with those tasks. Pt would benefit from additional acute OT prior to D/C home with supervision to practice AE and shower transfer to 3in1. Will continue to follow.    Follow Up Recommendations  Supervision/Assistance - 24 hour          Precautions / Restrictions Precautions Precautions: Posterior Hip;Fall Precaution Booklet Issued: Yes (comment) Precaution Comments: reviewed precautions with pt Required Braces or Orthoses: Knee Immobilizer - Left Knee Immobilizer - Left:  (in bed) Restrictions Weight Bearing Restrictions: Yes LLE Weight Bearing: Weight bearing as tolerated      Mobility Bed Mobility Overal bed mobility: Needs Assistance   General bed mobility comments: Pt up in chair upon OT tx.  Transfers Overall transfer level: Needs assistance Equipment used: Rolling walker (2 wheeled) Transfers: Sit to/from Stand Sit to Stand: Min guard         General transfer comment: v/c's for safe hand placement    Balance Overall balance assessment: Needs assistance   Sitting balance-Leahy Scale: Fair     Standing balance support: During functional activity Standing balance-Leahy Scale: Poor Standing balance comment: requires RW due to L LE s/p surgery                            ADL Overall ADL's : Needs assistance/impaired Eating/Feeding:  Independent;Sitting   Grooming: Min guard;Wash/dry hands;Standing   Upper Body Bathing: Set up;Sitting   Lower Body Bathing: Minimal assistance;Adhering to hip precautions;Sit to/from stand;With adaptive equipment   Upper Body Dressing : Set up;Sitting   Lower Body Dressing: Minimal assistance;With adaptive equipment;Adhering to hip precautions;Sit to/from stand   Toilet Transfer: Min guard;Ambulation;RW;BSC (over the toilet)   Toileting- Clothing Manipulation and Hygiene: Min guard;Sit to/from stand;Adhering to hip precautions       Functional mobility during ADLs: Min guard;Rolling walker General ADL Comments: Educated pt on LB bathing/dressing sequence and technique. Pt would benefit from AE education and demonstration next session.               Pertinent Vitals/Pain Pt c/o pain at the end of tx but did not rate. Nurse was notified.     Hand Dominance Right   Extremity/Trunk Assessment Upper Extremity Assessment Upper Extremity Assessment: Generalized weakness   Lower Extremity Assessment Lower Extremity Assessment: Defer to PT evaluation    Cervical / Trunk Assessment Cervical / Trunk Assessment: Normal   Communication Communication Communication: No difficulties   Cognition Arousal/Alertness: Awake/alert Behavior During Therapy: WFL for tasks assessed/performed Overall Cognitive Status: Within Functional Limits for tasks assessed                                Home Living Family/patient expects to be discharged to:: Private residence Living Arrangements: Alone Available Help at Discharge: Family;Available 24 hours/day Type of Home: House Home Access: Stairs  to enter Entrance Stairs-Number of Steps: 2 Entrance Stairs-Rails: Right Home Layout: One level     Bathroom Shower/Tub: Walk-in shower;Curtain Shower/tub characteristics: Architectural technologist: Handicapped height     Home Equipment: [place] - single point;Walker - 2  wheels;Bedside commode;Hand held shower head   Additional Comments: pt reports having HHPT back in may and daughter stayed with her for a month.      Prior Functioning/Environment Level of Independence: Independent        Comments: Pt states that  had not been using her walker and that she was independent wiht ADLs, cooking, laundry and was driving to the beauty salon    OT Diagnosis: Acute pain;Generalized weakness   OT Problem List: Decreased strength;Decreased range of motion;Decreased activity tolerance;Decreased knowledge of use of DME or AE;Decreased safety awareness;Impaired balance (sitting and/or standing);Pain   OT Treatment/Interventions: Self-care/ADL training;Energy conservation;DME and/or AE instruction;Balance training;Patient/family education    OT Goals(Current goals can be found in the care plan section) Acute Rehab OT Goals Patient Stated Goal: go home not SNF ADL Goals Pt Will Perform Lower Body Bathing: with supervision;with adaptive equipment;sit to/from stand Pt Will Perform Lower Body Dressing: with supervision;with adaptive equipment;sit to/from stand Pt Will Transfer to Toilet: with supervision;ambulating;bedside commode (over toilet) Pt Will Perform Toileting - Clothing Manipulation and hygiene: with supervision;sit to/from stand Pt Will Perform Tub/Shower Transfer: Shower transfer;with supervision;ambulating;rolling walker;3 in 1  OT Frequency: Min 2X/week    End of Session Equipment Utilized During Treatment: Gait belt;Rolling walker Nurse Communication: Patient requests pain meds (Her bandage was bleeding)  Activity Tolerance: Patient tolerated treatment well Patient left: in chair;with family/visitor present;with call bell/phone within reach   Time:  -    Charges:    G-CodesLyda Perone 2014-02-14, 1:09 PM

## 2014-02-09 LAB — BASIC METABOLIC PANEL
Anion gap: 16 — ABNORMAL HIGH (ref 5–15)
BUN: 23 mg/dL (ref 6–23)
CO2: 23 mEq/L (ref 19–32)
Calcium: 9 mg/dL (ref 8.4–10.5)
Chloride: 96 mEq/L (ref 96–112)
Creatinine, Ser: 0.82 mg/dL (ref 0.50–1.10)
GFR, EST AFRICAN AMERICAN: 76 mL/min — AB (ref >90)
GFR, EST NON AFRICAN AMERICAN: 66 mL/min — AB (ref >90)
GLUCOSE: 91 mg/dL (ref 70–99)
Potassium: 4.2 mEq/L (ref 3.7–5.3)
Sodium: 135 mEq/L — ABNORMAL LOW (ref 137–147)

## 2014-02-09 LAB — HEMOGLOBIN AND HEMATOCRIT, BLOOD
HCT: 33.7 % — ABNORMAL LOW (ref 36.0–46.0)
Hemoglobin: 10.8 g/dL — ABNORMAL LOW (ref 12.0–15.0)

## 2014-02-09 MED ORDER — BISACODYL 10 MG RE SUPP
10.0000 mg | Freq: Every day | RECTAL | Status: DC
  Administered 2014-02-10: 10 mg via RECTAL
  Filled 2014-02-09: qty 1

## 2014-02-09 MED ORDER — POLYETHYLENE GLYCOL 3350 17 G PO PACK
17.0000 g | PACK | Freq: Two times a day (BID) | ORAL | Status: DC
  Administered 2014-02-09 – 2014-02-10 (×3): 17 g via ORAL
  Filled 2014-02-09 (×4): qty 1

## 2014-02-09 NOTE — Progress Notes (Signed)
Patient refused Dulcolax suppository 10mg  stating "I wanna try and go on my own.  I'm not hurting or anything".  BS in all quads, no c/o constipation.

## 2014-02-09 NOTE — ED Provider Notes (Signed)
Medical screening examination/treatment/procedure(s) were conducted as a shared visit with non-physician practitioner(s) and myself.  I personally evaluated the patient during the encounter.   EKG Interpretation   Date/Time:  Friday February 07 2014 10:25:38 EDT Ventricular Rate:  74 PR Interval:  186 QRS Duration: 83 QT Interval:  397 QTC Calculation: 440 R Axis:   57 Text Interpretation:  Sinus rhythm Anteroseptal infarct, age indeterminate  Minimal ST elevation, inferior leads No significant change since last  tracing Confirmed by Winfred Leeds  MD, Katheryne Gorr [phone]) on 02/07/2014 10:35:21 AM       Orlie Dakin, MD 02/09/14 2056

## 2014-02-09 NOTE — Progress Notes (Signed)
Occupational Therapy Treatment Patient Details Name: Carolyn [name] MRN: [id number] DOB: Oct 27, 1930 Today's Date: 02/09/2014    History of present illness Pt is a 80 yo female who fell at home this am while in the yard on 7/17. Radiographs show displaced left femoral neck fracture. Pt s/p left post hip hemiarthroplasty.    OT comments  Pt progressing toward goals. Will continue to follow acutely. Recommend 24/7 assistance at home.  Follow Up Recommendations  Supervision/Assistance - 24 hour    Equipment Recommendations  None recommended by OT    Recommendations for Other Services      Precautions / Restrictions Precautions Precautions: Posterior Hip;Fall Precaution Booklet Issued: Yes (comment) Precaution Comments: reviewed precautions with pt Required Braces or Orthoses: Knee Immobilizer - Left Knee Immobilizer - Left:  (in bed) Restrictions Weight Bearing Restrictions: Yes LLE Weight Bearing: Weight bearing as tolerated       Mobility Bed Mobility Overal bed mobility: Needs Assistance Bed Mobility: Supine to Sit     Supine to sit: Min assist     General bed mobility comments: Assist (with HOB flat and no rails) to bring trunk up. Cueing for sequencing to bring LEs OOB.  Transfers Overall transfer level: Needs assistance Equipment used: Rolling walker (2 wheeled) Transfers: Sit to/from Stand Sit to Stand: Min guard         General transfer comment: v/c's for safe hand placement    Balance                                   ADL Overall ADL's : Needs assistance/impaired     Grooming: Supervision/safety;Wash/dry hands;Standing                   Armed forces technical officer: Min guard;Ambulation;RW;BSC (over toilet)   Toileting- Clothing Manipulation and Hygiene: Min guard;Sit to/from stand;Adhering to hip precautions         General ADL Comments: OT instructed on use and acquisition of AE.  Pt states she is not sure if she needs AE (children  will be home to assist). Pt maintained hip precautions throughout session.      Vision                     Perception     Praxis      Cognition   Behavior During Therapy: WFL for tasks assessed/performed Overall Cognitive Status: Within Functional Limits for tasks assessed                       Extremity/Trunk Assessment               Exercises     Shoulder Instructions       General Comments      Pertinent Vitals/ Pain       See vitals tab  Home Living                                          Prior Functioning/Environment              Frequency Min 2X/week     Progress Toward Goals  OT Goals(current goals can now be found in the care plan section)     Acute Rehab OT Goals Patient Stated Goal: go home not SNF ADL Goals Pt  Will Perform Lower Body Bathing: with supervision;with adaptive equipment;sit to/from stand Pt Will Perform Lower Body Dressing: with supervision;with adaptive equipment;sit to/from stand Pt Will Transfer to Toilet: with supervision;ambulating;bedside commode Pt Will Perform Toileting - Clothing Manipulation and hygiene: with supervision;sit to/from stand Pt Will Perform Tub/Shower Transfer: Shower transfer;with supervision;ambulating;rolling walker;3 in 1  Plan Discharge plan remains appropriate    Co-evaluation                 End of Session Equipment Utilized During Treatment: Gait belt;Rolling walker   Activity Tolerance Patient tolerated treatment well   Patient Left in chair;with call bell/phone within reach   Nurse Communication Mobility status        Time: 3818-2993 OT Time Calculation (min): 33 min  Charges: OT General Charges $OT Visit: 1 Procedure OT Treatments $Self Care/Home Management : 23-37 mins  Darrol Jump 02/09/2014, 11:41 AM 02/09/2014 Darrol Jump OTR/L Pager [phone] Office [phone]

## 2014-02-09 NOTE — Progress Notes (Signed)
Utilization review completed.  

## 2014-02-09 NOTE — Progress Notes (Signed)
Subjective: 2 Days Post-Op Procedure(s) (LRB): LEFT HIP PRESS FIT MONOPOLAR HEMIARTHROPLASTY  (Left) Patient reports pain as mild.    Objective: Vital signs in last 24 hours: Temp:  [97.9 F (36.6 C)-99.5 F (37.5 C)] 97.9 F (36.6 C) (07/19 0644) Pulse Rate:  [73-97] 86 (07/19 0644) Resp:  [16-18] 18 (07/19 0644) BP: (115-148)/(65-78) 120/65 mmHg (07/19 0644) SpO2:  [100 %] 100 % (07/19 0644)  Intake/Output from previous day: 07/18 0701 - 07/19 0700 In: 720 [P.O.:720] Out: -  Intake/Output this shift:     Recent Labs  02/07/14 1031 02/08/14 0415 02/09/14 0422  HGB 12.2 12.0 10.8*    Recent Labs  02/07/14 1031 02/08/14 0415 02/09/14 0422  WBC 7.2 10.3  --   RBC 4.10 4.07  --   HCT 38.2 37.6 33.7*  PLT 221 222  --     Recent Labs  02/08/14 0415 02/09/14 0422  NA 136* 135*  K 4.7 4.2  CL 98 96  CO2 22 23  BUN 20 23  CREATININE 0.77 0.82  GLUCOSE 100* 91  CALCIUM 9.0 9.0    Recent Labs  02/07/14 1031  INR 1.01    Neurologically intact  Assessment/Plan: 2 Days Post-Op Procedure(s) (LRB): LEFT HIP PRESS FIT MONOPOLAR HEMIARTHROPLASTY  (Left) Discharge home with home health  Once mobile and safe -- ? Tuesday AM T  YATES,MARK C 02/09/2014, 7:54 AM

## 2014-02-09 NOTE — Progress Notes (Signed)
Physical Therapy Treatment Patient Details Name: CARY WILFORD MRN: [id number] DOB: 24-Oct-1930 Today's Date: 02/09/2014    History of Present Illness Pt is a 80 yo female who fell at home this am while in the yard on 7/17. Radiographs show displaced left femoral neck fracture. Pt s/p left post hip hemiarthroplasty.     PT Comments    Pt progressing well, is walking with daughter in halls.  Able to perform 1 step for home access and is independent with hip precautions. Prefers [place] and requests Shanon Brow, PT, once cleared for d/c by MD.  Continues to benefit from acute PT until d/c.  Thanks.    Follow Up Recommendations  Home health PT;Supervision/Assistance - 24 hour     Equipment Recommendations  None recommended by PT    Recommendations for Other Services       Precautions / Restrictions Precautions Precautions: Posterior Hip;Fall Precaution Booklet Issued: Yes (comment) Precaution Comments: reviewed precautions with pt Required Braces or Orthoses: Knee Immobilizer - Left Knee Immobilizer - Left:  (in bed) Restrictions Weight Bearing Restrictions: Yes LLE Weight Bearing: Weight bearing as tolerated    Mobility  Bed Mobility Overal bed mobility: Needs Assistance Bed Mobility: Supine to Sit     Supine to sit: Min assist     General bed mobility comments: not observed this session  Transfers Overall transfer level: Needs assistance Equipment used: Rolling walker (2 wheeled) Transfers: Sit to/from Stand Sit to Stand: Min guard         General transfer comment: v/c's for safe hand placement  Ambulation/Gait Ambulation/Gait assistance: Supervision Ambulation Distance (Feet): 100 Feet Assistive device: Rolling walker (2 wheeled) Gait Pattern/deviations: Step-through pattern;Antalgic   Gait velocity interpretation: Below normal speed for age/gender General Gait Details: good sequencing without cues.  raised RW 1 inch for posture, pt is flexed;     Stairs Stairs: Yes Stairs assistance: Supervision Stair Management: No rails;With walker Number of Stairs: 1 General stair comments: platform ascend/descend to simulate up to porch  Wheelchair Mobility    Modified Rankin (Stroke Patients Only)       Balance                                    Cognition Arousal/Alertness: Awake/alert Behavior During Therapy: WFL for tasks assessed/performed Overall Cognitive Status: Within Functional Limits for tasks assessed                      Exercises      General Comments        Pertinent Vitals/Pain Minimal pain "sore" declines meds at this time.    Home Living                      Prior Function            PT Goals (current goals can now be found in the care plan section) Acute Rehab PT Goals Patient Stated Goal: go home not SNF PT Goal Formulation: With patient Time For Goal Achievement: 02/15/14 Potential to Achieve Goals: Good Progress towards PT goals: Progressing toward goals    Frequency  Min 5X/week    PT Plan Current plan remains appropriate    Co-evaluation             End of Session   Activity Tolerance: Patient tolerated treatment well Patient left: in chair;with call bell/phone within reach;with  chair alarm set     Time: 1210-1245 PT Time Calculation (min): 35 min  Charges:  $Gait Training: 8-22 mins $Therapeutic Activity: 8-22 mins                    G Codes:      Herbie Drape 02/09/2014, 12:51 PM

## 2014-02-09 NOTE — Progress Notes (Signed)
Patient Demographics  Carolyn [name], is a 80 y.o. female, DOB - 13-Jan-1931, WGN:[id number]  Admit date - 02/07/2014   Admitting Physician Annita Brod, MD  Outpatient Primary MD for the patient is Carolyn [name]  LOS - 2   Chief Complaint  Patient presents with  . Fall        Subjective:    Carolyn [name] today has, No headache, No chest pain, No abdominal pain - No Nausea, No new weakness tingling or numbness, No Cough - SOB.      Assessment & Plan     1. Mechanical fall with closed left displaced subcapital femoral neck fracture - status post open reduction internal fixation on 02/08/2014 morning by Dr. Lorin Mercy, as tolerated procedure well, initiate PT. Weight bearing as tolerated, aspirin for DVT prophylaxis per orthopedics. May require placement vs HHPT. H&H stable.   2. Essential hypertension. On comminution of beta blocker and ARB which will be continued. Monitor blood pressure.   3. GERD on PPI. Had mild nausea this morning secondary to narcotics, resolved, supportive care with Zofran as needed.   4. Dyslipidemia continue statin.   5. Hypothyroidism. Continue Synthroid.     6. Mild operative bleeding related anemia. No need for transfusion stable.   Code Status: Full  Family Communication: none present  Disposition Plan: TBD   Procedures ORIF 02-08-14 Dr Lorin Mercy    Consults  Ortho   Medications  Scheduled Meds: . aspirin EC  325 mg Oral Q breakfast  . atorvastatin  20 mg Oral q1800  . bisacodyl  10 mg Rectal Daily  . calcium-vitamin D  1 tablet Oral Q breakfast  . docusate sodium  100 mg Oral BID  . irbesartan  300 mg Oral Daily  . levothyroxine  25 mcg Oral QAC breakfast  . metoprolol succinate  50 mg Oral Daily  . pantoprazole  80 mg Oral  Q1200  . polyethylene glycol  17 g Oral BID   Continuous Infusions: . sodium chloride     PRN Meds:.acetaminophen, ALPRAZolam, fentaNYL, HYDROcodone-acetaminophen, menthol-cetylpyridinium, metoprolol, morphine injection, ondansetron (ZOFRAN) IV, oxyCODONE-acetaminophen  DVT Prophylaxis  ASA  Lab Results  Component Value Date   PLT 222 02/08/2014    Antibiotics     Anti-infectives   Start     Dose/Rate Route Frequency Ordered Stop   02/07/14 1801  ceFAZolin (ANCEF) 2-3 GM-% IVPB SOLR    Comments:  O'Laughlin, Karen   : cabinet override      02/07/14 1801 02/07/14 1814   02/07/14 1800  ceFAZolin (ANCEF) IVPB 2 g/50 mL premix  Status:  Discontinued     2 g 100 mL/hr over 30 Minutes Intravenous  Once 02/07/14 1757 02/07/14 2026          Objective:   Filed Vitals:   02/08/14 2120 02/08/14 2343 02/09/14 0330 02/09/14 0644  BP: 120/72   120/65  Pulse: 78   86  Temp: 99 F (37.2 C)   97.9 F (36.6 C)  TempSrc: Oral   Oral  Resp: 18 18 18 18   Height:      Weight:      SpO2: 100% 100% 100% 100%    Wt Readings from Last 3 Encounters:  02/07/14 66.679 kg (147 lb)  02/07/14 66.679 kg (147 lb)  11/28/13 67.858 kg (149 lb 9.6 oz)     Intake/Output Summary (Last 24 hours) at 02/09/14 0916 Last data filed at 02/09/14 0646  Gross per 24 hour  Intake    720 ml  Output      0 ml  Net    720 ml     Physical Exam  Awake Alert, Oriented X 3, No new F.N deficits, Normal affect [place].AT,PERRAL Supple Neck,No JVD, No cervical lymphadenopathy appriciated.  Symmetrical Chest wall movement, Good air movement bilaterally, CTAB RRR,No Gallops,Rubs or new Murmurs, No Parasternal Heave +ve B.Sounds, Abd Soft, No tenderness, No organomegaly appriciated, No rebound - guarding or rigidity. No Cyanosis, Clubbing or edema, No new Rash or bruise     Data Review   Micro Results Recent Results (from the past 240 hour(s))  URINE CULTURE     Status: None   Collection Time    02/07/14  11:20 AM      Result Value Ref Range Status   Specimen Description URINE, CATHETERIZED   Final   Special Requests NONE   Final   Culture  Setup Time     Final   Value: 02/07/2014 12:17     Performed at [facility]     Final   Value: NO GROWTH     Performed at [facility]   Culture     Final   Value: NO GROWTH     Performed at [facility]   Report Status 02/08/2014 FINAL   Final  SURGICAL PCR SCREEN     Status: None   Collection Time    02/07/14  3:54 PM      Result Value Ref Range Status   MRSA, PCR NEGATIVE  NEGATIVE Final   Staphylococcus aureus NEGATIVE  NEGATIVE Final   Comment:            The Xpert SA Assay (FDA     approved for NASAL specimens     in patients over 34 years of age),     is one component of     a comprehensive surveillance     program.  Test performance has     been validated by [employer] for patients greater     than or equal to 35 year old.     It is not intended     to diagnose infection nor to     guide or monitor treatment.    Radiology Reports   Dg Chest 1 View  02/07/2014   CLINICAL DATA:  Status post left hip fracture  EXAM: CHEST - 1 VIEW  COMPARISON:  PA and lateral chest of Dec 09, 2013  FINDINGS: The lungs remain hyperinflated. There is stable blunting of the left lateral costophrenic angle. The heart is normal in size. The pulmonary vascularity is normal. The mediastinum is normal in width. There is a small hiatal hernia. There is curvature of the thoracolumbar spine with the convexity towards the right. The observed portions of the ribs are normal.  IMPRESSION: COPD and chronic changes at the left base. There is no acute cardiopulmonary abnormality.   Electronically Signed   By: David  Martinique   On: 02/07/2014 11:13    Dg Hip Complete Left  02/07/2014   CLINICAL DATA:  LEFT hip pain and injury post fall  EXAM: LEFT HIP - COMPLETE 2+ VIEW  COMPARISON:  09/30/2013  FINDINGS: Diffuse osseous  demineralization.  Displaced subcapital fracture LEFT femoral neck.  No dislocation.  Pelvis appears intact.  Symmetric hip and SI joints.  Scattered vascular calcification.  IMPRESSION: Displaced subcapital fracture of LEFT femoral neck, new.   Electronically Signed   By: Lavonia Dana M.D.   On: 02/07/2014 11:14    Dg Pelvis Portable  02/07/2014   CLINICAL DATA:  Postop left hip replacement  EXAM: PORTABLE PELVIS 1-2 VIEWS  COMPARISON:  None.  FINDINGS: There is left hip arthroplasty without hardware failure or complication. There is no fracture or dislocation. There are postsurgical changes in the surrounding soft tissues.  IMPRESSION: Left hip arthroplasty without hardware failure, complication or dislocation.   Electronically Signed   By: Kathreen Devoid   On: 02/07/2014 19:58     CBC  Recent Labs Lab 02/07/14 1031 02/08/14 0415 02/09/14 0422  WBC 7.2 10.3  --   HGB 12.2 12.0 10.8*  HCT 38.2 37.6 33.7*  PLT 221 222  --   MCV 93.2 92.4  --   MCH 29.8 29.5  --   MCHC 31.9 31.9  --   RDW 14.4 14.6  --   LYMPHSABS 1.0  --   --   MONOABS 0.4  --   --   EOSABS 0.1  --   --   BASOSABS 0.0  --   --     Chemistries   Recent Labs Lab 02/07/14 1031 02/08/14 0415 02/09/14 0422  NA 139 136* 135*  K 4.5 4.7 4.2  CL 101 98 96  CO2 25 22 23   GLUCOSE 111* 100* 91  BUN 21 20 23   CREATININE 0.78 0.77 0.82  CALCIUM 9.7 9.0 9.0   ------------------------------------------------------------------------------------------------------------------ estimated creatinine clearance is 57.2 ml/min (by C-G formula based on Cr of 0.82). ------------------------------------------------------------------------------------------------------------------ No results found for this basename: HGBA1C,  in the last 72 hours ------------------------------------------------------------------------------------------------------------------ No results found for this basename: CHOL, HDL, LDLCALC, TRIG, CHOLHDL,  LDLDIRECT,  in the last 72 hours ------------------------------------------------------------------------------------------------------------------ No results found for this basename: TSH, T4TOTAL, FREET3, T3FREE, THYROIDAB,  in the last 72 hours ------------------------------------------------------------------------------------------------------------------ No results found for this basename: VITAMINB12, FOLATE, FERRITIN, TIBC, IRON, RETICCTPCT,  in the last 72 hours  Coagulation profile  Recent Labs Lab 02/07/14 1031  INR 1.01    No results found for this basename: DDIMER,  in the last 72 hours  Cardiac Enzymes No results found for this basename: CK, CKMB, TROPONINI, MYOGLOBIN,  in the last 168 hours ------------------------------------------------------------------------------------------------------------------ No components found with this basename: POCBNP,      Time Spent in minutes   35   Steph Cheadle K M.D on 02/09/2014 at 9:16 AM  Between 7am to 7pm - Pager - [phone]  After 7pm go to [email or web address] - password [email or web address]  And look for the night coverage person covering for me after hours  [facility] Office  [phone]   **Disclaimer: This note may have been dictated with voice recognition software. Similar sounding words can inadvertently be transcribed and this note may contain transcription errors which may not have been corrected upon publication of note.**

## 2014-02-10 ENCOUNTER — Encounter (HOSPITAL_COMMUNITY): Payer: Self-pay | Admitting: Orthopaedic Surgery

## 2014-02-10 MED ORDER — POLYETHYLENE GLYCOL 3350 17 G PO PACK: 17.0000 g | PACK | Freq: Two times a day (BID) | ORAL | Status: DC

## 2014-02-10 MED ORDER — DSS 100 MG PO CAPS: 100.0000 mg | ORAL_CAPSULE | Freq: Two times a day (BID) | ORAL | Status: DC

## 2014-02-10 MED ORDER — ONDANSETRON HCL 4 MG PO TABS
4.0000 mg | ORAL_TABLET | Freq: Once | ORAL | Status: AC
Start: 2014-02-10 — End: 2014-02-10
  Administered 2014-02-10: 4 mg via ORAL
  Filled 2014-02-10: qty 1

## 2014-02-10 MED ORDER — ASPIRIN 325 MG PO TBEC: 325.0000 mg | DELAYED_RELEASE_TABLET | Freq: Every day | ORAL | Status: DC

## 2014-02-10 MED ORDER — ONDANSETRON HCL 4 MG PO TABS: 4.0000 mg | ORAL_TABLET | Freq: Once | ORAL | Status: DC

## 2014-02-10 MED ORDER — BISACODYL 10 MG RE SUPP: 10.0000 mg | Freq: Every day | RECTAL | Status: DC | PRN

## 2014-02-10 MED ORDER — HYDROCODONE-ACETAMINOPHEN 5-325 MG PO TABS: 1.0000 | ORAL_TABLET | Freq: Four times a day (QID) | ORAL | Status: DC | PRN

## 2014-02-10 NOTE — Progress Notes (Signed)
I have read and agree with this note.   Time XU/XYB:3383-2919 Total time: 19 minutes ([place])  Golden Circle, OTR/L [phone]

## 2014-02-10 NOTE — Progress Notes (Signed)
I have read and agree with this note.   Time HI/DUP:735-7897  Total time:18 minutes ([place])  Golden Circle, OTR/L [phone]

## 2014-02-10 NOTE — Progress Notes (Signed)
Occupational Therapy Treatment and Discharge Patient Details Name: Carolyn [name] MRN: [id number] DOB: 10-Jan-1931 Today's Date: 02/10/2014    History of present illness Pt is a 80 yo female who fell at home while in the yard on 7/17. Radiographs show displaced left femoral neck fracture. Pt s/p left post hip hemiarthroplasty.    OT comments  Focus of session was on practicing a shower transfer to the [place] and reviewing LB dressing sequence. All education has been completed and pt will have 24/7 supervision to help with ADL/IADLs as needed at home. No further OT is needed, we will sign off.  Follow Up Recommendations  Supervision/Assistance - 24 hour    Equipment Recommendations  None recommended by OT       Precautions / Restrictions Precautions Precautions: Posterior Hip;Fall Precaution Comments: Pt recalls 3/3 hip precautions Required Braces or Orthoses: Knee Immobilizer - Left Knee Immobilizer - Left: Other (comment) (in bed) Restrictions LLE Weight Bearing: Weight bearing as tolerated       Mobility Bed Mobility Overal bed mobility: Needs Assistance Bed Mobility: Supine to Sit     Supine to sit: Min guard        Transfers Overall transfer level: Needs assistance Equipment used: Rolling walker (2 wheeled) Transfers: Sit to/from Stand Sit to Stand: Min guard            Balance Overall balance assessment: Needs assistance   Sitting balance-Leahy Scale: Good       Standing balance-Leahy Scale: Fair                     ADL Overall ADL's : Needs assistance/impaired                                 Tub/ Shower Transfer: Walk-in shower;Ambulation;3 in 1;Rolling walker;Min guard   Functional mobility during ADLs: Rolling walker;Min guard General ADL Comments: Educated and had pt demonstrate knowledge of how to do a shower transfer to the 3in1 while adhering to posterior hip precautions and pt was given written instructions on sequence.  Pt unable to state LB dressing sequence but instructions were written out for pt to take home with her.                Cognition   Behavior During Therapy: WFL for tasks assessed/performed Overall Cognitive Status: Within Functional Limits for tasks assessed                                    Pertinent Vitals/ Pain       No c/o pain during tx.          Frequency Min 2X/week     Progress Toward Goals  OT Goals(current goals can now be found in the care plan section)  Progress towards OT goals:  (All education completed)  Acute Rehab OT Goals Patient Stated Goal: go home   Plan Discharge plan remains appropriate       End of Session Equipment Utilized During Treatment: Rolling walker   Activity Tolerance Patient tolerated treatment well   Patient Left with call bell/phone within reach;in chair;with nursing/sitter in room           Time: 1459-1518 OT Time Calculation (min): 19 min  Charges: OT General Charges $OT Visit: 1 Procedure OT Treatments $Self Care/Home Management : 8-22 mins  Dewaine Conger,  Eliceo Gladu 02/10/2014, 3:52 PM

## 2014-02-10 NOTE — Clinical Social Work Note (Signed)
CSW consulted for possible SNF placement at time of discharge. Per chart review, pt to be discharged home with home health services. Thank you for the referral.  Lubertha Sayres, MSW, [facility] Licensed Clinical Social Worker [phone] and [phone]

## 2014-02-10 NOTE — Discharge Instructions (Signed)
Follow with Primary MD Vedia Coffer, PA-C in 7 days   Get CBC, CMP, 2 view Chest X ray checked  by Primary MD next visit.    Activity: As tolerated with Full fall precautions use walker/cane & assistance as needed   Disposition Home     Diet: Heart Healthy  For Heart failure patients - Check your Weight same time everyday, if you gain over 2 pounds, or you develop in leg swelling, experience more shortness of breath or chest pain, call your Primary MD immediately. Follow Cardiac Low Salt Diet and 1.8 lit/day fluid restriction.   On your next visit with her primary care physician please Get Medicines reviewed and adjusted.  Please request your Prim.MD to go over all Hospital Tests and Procedure/Radiological results at the follow up, please get all Hospital records sent to your Prim MD by signing hospital release before you go home.   If you experience worsening of your admission symptoms, develop shortness of breath, life threatening emergency, suicidal or homicidal thoughts you must seek medical attention immediately by calling 911 or calling your MD immediately  if symptoms less severe.  You Must read complete instructions/literature along with all the possible adverse reactions/side effects for all the Medicines you take and that have been prescribed to you. Take any new Medicines after you have completely understood and accpet all the possible adverse reactions/side effects.   Do not drive, operating heavy machinery, perform activities at heights, swimming or participation in water activities or provide baby sitting services if your were admitted for syncope or siezures until you have seen by Primary MD or a Neurologist and advised to do so again.  Do not drive when taking Pain medications.    Do not take more than prescribed Pain, Sleep and Anxiety Medications  Special Instructions: If you have smoked or chewed Tobacco  in the last 2 yrs please stop smoking, stop any regular  Alcohol  and or any Recreational drug use.  Wear Seat belts while driving.   Please note  You were cared for by a hospitalist during your hospital stay. If you have any questions about your discharge medications or the care you received while you were in the hospital after you are discharged, you can call the unit and asked to speak with the hospitalist on call if the hospitalist that took care of you is not available. Once you are discharged, your primary care physician will handle any further medical issues. Please note that NO REFILLS for any discharge medications will be authorized once you are discharged, as it is imperative that you return to your primary care physician (or establish a relationship with a primary care physician if you do not have one) for your aftercare needs so that they can reassess your need for medications and monitor your lab values.

## 2014-02-10 NOTE — Progress Notes (Signed)
Patient had large BM after suppository - SSE not needed.

## 2014-02-10 NOTE — Progress Notes (Signed)
Physical Therapy Treatment Patient Details Name: Carolyn [name] MRN: [id number] DOB: Feb 20, 1931 Today's Date: 02/10/2014    History of Present Illness Pt is a 80 yo female who fell at home while in the yard on 7/17. Radiographs show displaced left femoral neck fracture. Pt s/p left post hip hemiarthroplasty.     PT Comments    Patient is progressing well towards physical therapy goals, ambulating up to 150 feet at a supervision level while using rolling walker. Stair training was safely completed this weekend and patient has no further questions regarding mobility at this time. All education has been reviewed and patient will have 24 hour care at home upon d/c. Feel she is adequate for d/c from a PT standpoint when medically ready. Patient will continue to benefit from skilled physical therapy services at home with HHPT to further improve independence with functional mobility.    Follow Up Recommendations  Home health PT;Supervision/Assistance - 24 hour     Equipment Recommendations  None recommended by PT    Recommendations for Other Services       Precautions / Restrictions Precautions Precautions: Posterior Hip;Fall Precaution Comments: Pt recalls 0/3 posterior hip precautions - reviewed handout with precautions. Required Braces or Orthoses: Knee Immobilizer - Left Restrictions Weight Bearing Restrictions: Yes LLE Weight Bearing: Weight bearing as tolerated    Mobility  Bed Mobility                  Transfers Overall transfer level: Needs assistance Equipment used: Rolling walker (2 wheeled) Transfers: Sit to/from Stand Sit to Stand: Min guard         General transfer comment: Min guard for safety. requires extra time but no physical assist.  Ambulation/Gait Ambulation/Gait assistance: Supervision Ambulation Distance (Feet): 150 Feet Assistive device: Rolling walker (2 wheeled) Gait Pattern/deviations: Step-through pattern;Decreased stride  length;Antalgic;Trunk flexed   Gait velocity interpretation: Below normal speed for age/gender General Gait Details: Demonstrates good step-through gait pattern. VC for walker placement and upright posture intermittently. No loss of balance noted during therapy session.   Stairs            Wheelchair Mobility    Modified Rankin (Stroke Patients Only)       Balance                                    Cognition Arousal/Alertness: Awake/alert Behavior During Therapy: WFL for tasks assessed/performed Overall Cognitive Status: Within Functional Limits for tasks assessed                      Exercises Total Joint Exercises Ankle Circles/Pumps: AROM;Both;10 reps;Seated Long Arc Quad: AROM;Strengthening;Both;10 reps;Seated    General Comments General comments (skin integrity, edema, etc.): Pt reports she is ready to go home today and has no further questions for PT.      Pertinent Vitals/Pain Pt reports pain as "pretty low" no numerical value given Patient repositioned in chair for comfort.     Home Living                      Prior Function            PT Goals (current goals can now be found in the care plan section) Acute Rehab PT Goals PT Goal Formulation: With patient Time For Goal Achievement: 02/15/14 Potential to Achieve Goals: Good Progress towards PT goals: Progressing toward goals  Frequency  Min 5X/week    PT Plan Current plan remains appropriate    Co-evaluation             End of Session   Activity Tolerance: Patient tolerated treatment well Patient left: in chair;with call bell/phone within reach;with chair alarm set     Time: 8887-5797 PT Time Calculation (min): 18 min  Charges:  $Gait Training: 8-22 mins                    G Codes:      Elayne Snare, [place]  Ellouise Newer 02/10/2014, 9:35 AM

## 2014-02-10 NOTE — Discharge Summary (Signed)
Carolyn [name], is a 80 y.o. female  DOB 1930-10-17  MRN [id number].  Admission date:  02/07/2014  Admitting Physician  Annita Brod, MD  Discharge Date:  02/10/2014   Primary MD  [name]  Recommendations for primary care physician for things to follow:   Check CBC BMP next visit   Admission Diagnosis  Essential hypertension [401.9] Hip fracture, left, closed, initial encounter [820.8]   Discharge Diagnosis  Essential hypertension [401.9] Hip fracture, left, closed, initial encounter [820.8]    Principal Problem:   Hip fracture Active Problems:   Unspecified hypothyroidism   Essential hypertension, malignant   GERD (gastroesophageal reflux disease)   Other and unspecified hyperlipidemia      Past Medical History  Diagnosis Date  . Hypertension   . High cholesterol   . Hypothyroidism   . Asthma     "only when I smoked"  . H/O hiatal hernia     "twice"  . GERD (gastroesophageal reflux disease)   . Arthritis     "both knees" (11/26/2013)  . Depression     "little bit" (11/26/2013)  . Anxiety     "little bit" (11/26/2013)  . COPD (chronic obstructive pulmonary disease)   . Pneumonia 11/2013    Past Surgical History  Procedure Laterality Date  . Hernia repair    . Cholecystectomy    . Bladder suspension      "w/hysterectomy"  . Hiatal hernia repair  X 2  . Abdominal hysterectomy    . Cataract extraction w/ intraocular lens  implant, bilateral Bilateral   . Appendectomy    . Hip arthroplasty Left 02/07/2014    Procedure: LEFT HIP PRESS FIT MONOPOLAR HEMIARTHROPLASTY ;  Surgeon: Marybelle Killings, MD;  Location: [facility];  Service: Orthopedics;  Laterality: Left;       History of present illness and  Hospital Course:     Kindly see H&P for history of present illness and admission details, please  review complete Labs, Consult reports and Test reports for all details in brief  HPI  from the history and physical done on the day of admission  Carolyn [name] is a 80 y.o. female with past medical history of hypertension and hypothyroidism who at baseline is able to her without assistance and let her go about today in the yard and when she went to treat him, tripped over her stump landing on her left side. She started having severe pain and was unable to stand. By phone, paramedics regular did and patient was brought into emergency room. X-rays done in the ER noted subcapital left femoral fracture. Orthopedics were consulted and hospitalists were called for further evaluation and admission.     Hospital Course    1. Mechanical fall with closed left displaced subcapital femoral neck fracture - status post open reduction internal fixation on 02/08/2014 morning by Dr. Lorin Mercy, as tolerated procedure well, initiated PT. Weight bearing as tolerated, aspirin for DVT prophylaxis per orthopedics. H&H remained stable, qualified for home health physical therapy  which will be arranged upon discharge will follow with Dr. Inda Merlin in the next few days.  2. Essential hypertension. On combination of beta blocker and ARB which will be continued. Monitor blood pressure.   3. GERD on PPI. Had had intermittent nausea related to narcotics, also mildly constipated, commenced bowel regimen, Zofran as needed, she for now does not want Enema, she is passing flatus and abdominal exam completely unremarkable. Continue bowel regimen upon discharge.   4. Dyslipidemia continue statin.   5. Hypothyroidism. Continue Synthroid.   6. Mild operative bleeding related anemia. No need for transfusion stable.    Discharge Condition: stable   Follow UP  Follow-up Information   Follow up with FULBRIGHT,[place], PA-C. Schedule an appointment as soon as possible for a visit in 1 week.   Specialty:  Family Medicine   Contact  information:   [address] [place] [address]       Follow up with Marybelle Killings, MD. Schedule an appointment as soon as possible for a visit in 3 days.   Specialty:  Orthopedic Surgery   Contact information:   [place]  [id number] [phone]         Discharge Instructions  and  Discharge Medications     Discharge Instructions   Diet - low sodium heart healthy    Complete by:  As directed      Discharge instructions    Complete by:  As directed   Follow with Primary MD Vedia Coffer, PA-C in 7 days   Get CBC, CMP, 2 view Chest X ray checked  by Primary MD next visit.    Activity: As tolerated with Full fall precautions use walker/cane & assistance as needed   Disposition Home     Diet: Heart Healthy  For Heart failure patients - Check your Weight same time everyday, if you gain over 2 pounds, or you develop in leg swelling, experience more shortness of breath or chest pain, call your Primary MD immediately. Follow Cardiac Low Salt Diet and 1.8 lit/day fluid restriction.   On your next visit with her primary care physician please Get Medicines reviewed and adjusted.  Please request your Prim.MD to go over all Hospital Tests and Procedure/Radiological results at the follow up, please get all Hospital records sent to your Prim MD by signing hospital release before you go home.   If you experience worsening of your admission symptoms, develop shortness of breath, life threatening emergency, suicidal or homicidal thoughts you must seek medical attention immediately by calling 911 or calling your MD immediately  if symptoms less severe.  You Must read complete instructions/literature along with all the possible adverse reactions/side effects for all the Medicines you take and that have been prescribed to you. Take any new Medicines after you have completely understood and accpet all the possible adverse reactions/side effects.    Do not drive, operating heavy machinery, perform activities at heights, swimming or participation in water activities or provide baby sitting services if your were admitted for syncope or siezures until you have seen by Primary MD or a Neurologist and advised to do so again.  Do not drive when taking Pain medications.    Do not take more than prescribed Pain, Sleep and Anxiety Medications  Special Instructions: If you have smoked or chewed Tobacco  in the last 2 yrs please stop smoking, stop any regular Alcohol  and or any Recreational drug use.  Wear Seat belts while driving.  Please note  You were cared for by a hospitalist during your hospital stay. If you have any questions about your discharge medications or the care you received while you were in the hospital after you are discharged, you can call the unit and asked to speak with the hospitalist on call if the hospitalist that took care of you is not available. Once you are discharged, your primary care physician will handle any further medical issues. Please note that NO REFILLS for any discharge medications will be authorized once you are discharged, as it is imperative that you return to your primary care physician (or establish a relationship with a primary care physician if you do not have one) for your aftercare needs so that they can reassess your need for medications and monitor your lab values.  Follow with Primary MD Vedia Coffer, PA-C in 7 days   Get CBC, CMP, 2 view Chest X ray checked  by Primary MD next visit.    Activity: As tolerated with Full fall precautions use walker/cane & assistance as needed   Disposition Home     Diet: Heart Healthy  For Heart failure patients - Check your Weight same time everyday, if you gain over 2 pounds, or you develop in leg swelling, experience more shortness of breath or chest pain, call your Primary MD immediately. Follow Cardiac Low Salt Diet and 1.8 lit/day fluid  restriction.   On your next visit with her primary care physician please Get Medicines reviewed and adjusted.  Please request your Prim.MD to go over all Hospital Tests and Procedure/Radiological results at the follow up, please get all Hospital records sent to your Prim MD by signing hospital release before you go home.   If you experience worsening of your admission symptoms, develop shortness of breath, life threatening emergency, suicidal or homicidal thoughts you must seek medical attention immediately by calling 911 or calling your MD immediately  if symptoms less severe.  You Must read complete instructions/literature along with all the possible adverse reactions/side effects for all the Medicines you take and that have been prescribed to you. Take any new Medicines after you have completely understood and accpet all the possible adverse reactions/side effects.   Do not drive, operating heavy machinery, perform activities at heights, swimming or participation in water activities or provide baby sitting services if your were admitted for syncope or siezures until you have seen by Primary MD or a Neurologist and advised to do so again.  Do not drive when taking Pain medications.    Do not take more than prescribed Pain, Sleep and Anxiety Medications  Special Instructions: If you have smoked or chewed Tobacco  in the last 2 yrs please stop smoking, stop any regular Alcohol  and or any Recreational drug use.  Wear Seat belts while driving.   Please note  You were cared for by a hospitalist during your hospital stay. If you have any questions about your discharge medications or the care you received while you were in the hospital after you are discharged, you can call the unit and asked to speak with the hospitalist on call if the hospitalist that took care of you is not available. Once you are discharged, your primary care physician will handle any further medical issues. Please note  that NO REFILLS for any discharge medications will be authorized once you are discharged, as it is imperative that you return to your primary care physician (or establish a relationship with a primary care physician if  you do not have one) for your aftercare needs so that they can reassess your need for medications and monitor your lab values.     Increase activity slowly    Complete by:  As directed             Medication List         ALPRAZolam 1 MG tablet  Commonly known as:  XANAX  Take 1 mg by mouth at bedtime as needed for anxiety.     aspirin 325 MG EC tablet  Take 1 tablet (325 mg total) by mouth daily with breakfast.     bisacodyl 10 MG suppository  Commonly known as:  DULCOLAX  Place 1 suppository (10 mg total) rectally daily as needed for moderate constipation.     CALTRATE 600+D 600-400 MG-UNIT per tablet  Generic drug:  Calcium Carbonate-Vitamin D  Take 1 tablet by mouth daily.     CRESTOR 10 MG tablet  Generic drug:  rosuvastatin  Take 10 mg by mouth daily.     DSS 100 MG Caps  Take 100 mg by mouth 2 (two) times daily.     FISH OIL PO  Take 1 capsule by mouth daily.     HYDROcodone-acetaminophen 5-325 MG per tablet  Commonly known as:  NORCO/VICODIN  Take 1 tablet by mouth every 6 (six) hours as needed for moderate pain.     irbesartan 300 MG tablet  Commonly known as:  AVAPRO  Take 300 mg by mouth daily.     levothyroxine 25 MCG tablet  Commonly known as:  SYNTHROID, LEVOTHROID  Take 25 mcg by mouth daily before breakfast.     levothyroxine 50 MCG tablet  Commonly known as:  SYNTHROID, LEVOTHROID  Take 1 tablet (50 mcg total) by mouth daily before breakfast.     MAGNESIUM PO  Take 1 tablet by mouth daily.     metoprolol succinate 50 MG 24 hr tablet  Commonly known as:  TOPROL-XL  Take 50 mg by mouth daily.     NEXIUM 40 MG capsule  Generic drug:  esomeprazole  Take 40 mg by mouth 2 (two) times daily before a meal.     ondansetron 4 MG  tablet  Commonly known as:  ZOFRAN  Take 1 tablet (4 mg total) by mouth once.     oxyCODONE-acetaminophen 5-325 MG per tablet  Commonly known as:  PERCOCET/ROXICET  1 tablet every 6 (six) hours as needed (pain).     polyethylene glycol packet  Commonly known as:  MIRALAX / GLYCOLAX  Take 17 g by mouth 2 (two) times daily.          Diet and Activity recommendation: See Discharge Instructions above   Consults obtained - Ortho   Major procedures and Radiology Reports - PLEASE review detailed and final reports for all details, in brief -      Dg Chest 1 View  02/07/2014   CLINICAL DATA:  Status post left hip fracture  EXAM: CHEST - 1 VIEW  COMPARISON:  PA and lateral chest of Dec 09, 2013  FINDINGS: The lungs remain hyperinflated. There is stable blunting of the left lateral costophrenic angle. The heart is normal in size. The pulmonary vascularity is normal. The mediastinum is normal in width. There is a small hiatal hernia. There is curvature of the thoracolumbar spine with the convexity towards the right. The observed portions of the ribs are normal.  IMPRESSION: COPD and chronic changes at the left base. There is  no acute cardiopulmonary abnormality.   Electronically Signed   By: David  Martinique   On: 02/07/2014 11:13   Dg Hip Complete Left  02/07/2014   CLINICAL DATA:  LEFT hip pain and injury post fall  EXAM: LEFT HIP - COMPLETE 2+ VIEW  COMPARISON:  09/30/2013  FINDINGS: Diffuse osseous demineralization.  Displaced subcapital fracture LEFT femoral neck.  No dislocation.  Pelvis appears intact.  Symmetric hip and SI joints.  Scattered vascular calcification.  IMPRESSION: Displaced subcapital fracture of LEFT femoral neck, new.   Electronically Signed   By: Lavonia Dana M.D.   On: 02/07/2014 11:14   Dg Pelvis Portable  02/07/2014   CLINICAL DATA:  Postop left hip replacement  EXAM: PORTABLE PELVIS 1-2 VIEWS  COMPARISON:  None.  FINDINGS: There is left hip arthroplasty without  hardware failure or complication. There is no fracture or dislocation. There are postsurgical changes in the surrounding soft tissues.  IMPRESSION: Left hip arthroplasty without hardware failure, complication or dislocation.   Electronically Signed   By: Kathreen Devoid   On: 02/07/2014 19:58    Micro Results     Recent Results (from the past 240 hour(s))  URINE CULTURE     Status: None   Collection Time    02/07/14 11:20 AM      Result Value Ref Range Status   Specimen Description URINE, CATHETERIZED   Final   Special Requests NONE   Final   Culture  Setup Time     Final   Value: 02/07/2014 12:17     Performed at [facility]     Final   Value: NO GROWTH     Performed at [facility]   Culture     Final   Value: NO GROWTH     Performed at [facility]   Report Status 02/08/2014 FINAL   Final  SURGICAL PCR SCREEN     Status: None   Collection Time    02/07/14  3:54 PM      Result Value Ref Range Status   MRSA, PCR NEGATIVE  NEGATIVE Final   Staphylococcus aureus NEGATIVE  NEGATIVE Final   Comment:            The Xpert SA Assay (FDA     approved for NASAL specimens     in patients over 110 years of age),     is one component of     a comprehensive surveillance     program.  Test performance has     been validated by [employer] for patients greater     than or equal to 68 year old.     It is not intended     to diagnose infection nor to     guide or monitor treatment.       Today   Subjective:   Carolyn [name] today has no headache,no chest abdominal pain,no new weakness tingling or numbness, feels much better wants to go home today.   Objective:   Blood pressure 119/65, pulse 86, temperature 98.2 F (36.8 C), temperature source Oral, resp. rate 16, height 5\' 9"  (1.753 m), weight 66.679 kg (147 lb), SpO2 96.00%.   Intake/Output Summary (Last 24 hours) at 02/10/14 1207 Last data filed at 02/10/14 0759  Gross per 24 hour   Intake    580 ml  Output      0 ml  Net    580 ml  Exam Awake Alert, Oriented x 3, No new F.N deficits, Normal affect Oliver.AT,PERRAL Supple Neck,No JVD, No cervical lymphadenopathy appriciated.  Symmetrical Chest wall movement, Good air movement bilaterally, CTAB RRR,No Gallops,Rubs or new Murmurs, No Parasternal Heave +ve B.Sounds, Abd Soft, Non tender, No organomegaly appriciated, No rebound -guarding or rigidity. No Cyanosis, Clubbing or edema, No new Rash or bruise  Data Review   CBC w Diff: Lab Results  Component Value Date   WBC 10.3 02/08/2014   HGB 10.8* 02/09/2014   HCT 33.7* 02/09/2014   PLT 222 02/08/2014   LYMPHOPCT 14 02/07/2014   MONOPCT 5 02/07/2014   EOSPCT 2 02/07/2014   BASOPCT 0 02/07/2014    CMP: Lab Results  Component Value Date   NA 135* 02/09/2014   K 4.2 02/09/2014   CL 96 02/09/2014   CO2 23 02/09/2014   BUN 23 02/09/2014   CREATININE 0.82 02/09/2014   PROT 6.5 11/27/2013   ALBUMIN 2.4* 11/27/2013   BILITOT 0.3 11/27/2013   ALKPHOS 99 11/27/2013   AST 25 11/27/2013   ALT 12 11/27/2013  .   Total Time in preparing paper work, data evaluation and todays exam - 35 minutes  Thurnell Lose M.D on 02/10/2014 at 12:07 PM  [facility] Office  [phone]   **Disclaimer: This note may have been dictated with voice recognition software. Similar sounding words can inadvertently be transcribed and this note may contain transcription errors which may not have been corrected upon publication of note.**

## 2014-02-10 NOTE — Progress Notes (Signed)
Occupational Therapy Treatment  Patient Details Name: Carolyn [name] MRN: [id number] DOB: 30-Jan-1931 Today's Date: 02/10/2014    History of present illness Pt is a 80 yo female who fell at home while in the yard on 7/17. Radiographs show displaced left femoral neck fracture. Pt s/p left post hip hemiarthroplasty.    OT comments  Focus of session was on reviewing precautions, LB bathing/dressing sequence and AE. Pt will have 24/7 supervision at home to assist with ADLs as needed and would benefit from one additional OT session to educate pt on shower transfer technique. Will continue to follow.  Follow Up Recommendations  Supervision/Assistance - 24 hour    Equipment Recommendations  None recommended by OT       Precautions / Restrictions Precautions Precautions: Posterior Hip;Fall Precaution Comments: Pt recalls 2/3 hip precautions, reviewed precautions with pt. Required Braces or Orthoses: Knee Immobilizer - Left Knee Immobilizer - Left: Other (comment) (in bed) Restrictions Weight Bearing Restrictions: Yes LLE Weight Bearing: Weight bearing as tolerated       Mobility Bed Mobility Overal bed mobility: Needs Assistance Bed Mobility: Sit to Supine     Sit to supine: Min assist   General bed mobility comments: to lift LLE onto bed  Transfers Overall transfer level: Needs assistance Equipment used: Rolling walker (2 wheeled) Transfers: Sit to/from Stand Sit to Stand: Min guard         General transfer comment: Min guard for safety, requires extra time but no physical assist. Encouraged pt to rock back and forth to build momentum to come to stand.    Balance Overall balance assessment: Needs assistance   Sitting balance-Leahy Scale: Good     Standing balance support: Bilateral upper extremity supported Standing balance-Leahy Scale: Poor                     ADL Overall ADL's : Needs assistance/impaired                                        General ADL Comments: Reviewed available AE to assist with LB bathing/dressing while adhering to hip precautions. Pt stated that she did not need them right now but would keep them in mind for later; she would have her family help with those tasks for the time being. Reviewed LB dressing sequence and precautions with pt.                Cognition   Behavior During Therapy: WFL for tasks assessed/performed Overall Cognitive Status: Within Functional Limits for tasks assessed       Memory: Decreased recall of precautions                            Pertinent Vitals/ Pain       No c/o pain during tx.         Frequency Min 2X/week     Progress Toward Goals  OT Goals(current goals can now be found in the care plan section)  Progress towards OT goals:  Progressing towards goals.  Acute Rehab OT Goals Patient Stated Goal: go home   Plan Discharge plan remains appropriate       End of Session Equipment Utilized During Treatment: Rolling walker   Activity Tolerance Patient tolerated treatment well   Patient Left with call bell/phone within reach;in bed;with bed alarm set  Time:  -     Charges:    Lyda Perone 02/10/2014, 10:27 AM

## 2014-02-11 NOTE — Care Management Note (Signed)
CARE MANAGEMENT NOTE 02/11/2014  Patient:  Carolyn [name], Carolyn [name]   Account Number:  [id number]  Date Initiated:  02/10/2014  Documentation initiated by:  Ricki Miller  Subjective/Objective Assessment:   80 yr old female s/p left hip fracture with left hip arthroplasty.     Action/Plan:   Patient preoperatively setup with [facility], no changes. Has family support at discharge. Has rolling walker and 3in1.   Anticipated DC Date:  02/10/2014   Anticipated DC Plan:  [place]  CM consult      [facility] Choice  HOME HEALTH   Choice offered to / List presented to:  [name] Patient        [place] arranged  [place] [facility]      [place].   Status of service:  Completed, signed off Medicare Important Message given?   (If response is "NO", the following Medicare IM given date fields will be blank) Date Medicare IM given:   Medicare IM given by:   Date Additional Medicare IM given:  02/10/2014 Additional Medicare IM given by:  Ricki Miller  Discharge Disposition:  [place]  Per UR Regulation:  Reviewed for med. necessity/level of care/duration of stay

## 2014-07-07 ENCOUNTER — Telehealth: Payer: Self-pay | Admitting: Internal Medicine

## 2014-07-07 NOTE — Telephone Encounter (Signed)
Spoke with patient and she is having problems with reflux and "sore stomach." States she started having problems after turned [age]. Scheduled with Dr. Olevia Perches on 07/21/14 at 8:45 AM.

## 2014-07-21 ENCOUNTER — Ambulatory Visit (INDEPENDENT_AMBULATORY_CARE_PROVIDER_SITE_OTHER): Payer: Medicare Other | Admitting: Internal Medicine

## 2014-07-21 ENCOUNTER — Ambulatory Visit: Payer: Medicare Other | Admitting: Internal Medicine

## 2014-07-21 ENCOUNTER — Encounter: Payer: Self-pay | Admitting: Internal Medicine

## 2014-07-21 VITALS — BP 142/84 | HR 76 | Ht 69.0 in | Wt 164.4 lb

## 2014-07-21 DIAGNOSIS — K219 Gastro-esophageal reflux disease without esophagitis: Secondary | ICD-10-CM

## 2014-07-21 DIAGNOSIS — K2270 Barrett's esophagus without dysplasia: Secondary | ICD-10-CM

## 2014-07-21 NOTE — Progress Notes (Signed)
Carolyn [name] [birth year] [id number]  Note: This dictation was prepared with Dragon digital system. Any transcriptional errors that result from this procedure are unintentional.   History of Present Illness:  This is an 80 year old white female with a history of Barrett's esophagus on upper endoscopy in 1999 and again in 2004. There was no Barrett's on  2007EGD.Marland Kitchen She had a failed Nissen fundoplication and subsequently,  Belsey gastropexy which  Has been  functioning very well until several months ago.. She is here today because of breakthrough symptoms of gastroesophageal reflux and burning. She is on Nexium 40 mg twice a day. She denies dysphagia or odynophagia. She denies hoarseness or nocturnal cough. Her last colonoscopy in October 2007 showed diverticulosis.    Past Medical History  Diagnosis Date  . Hypertension   . High cholesterol   . Hypothyroidism   . Asthma     "only when I smoked"  . H/O hiatal hernia     "twice"  . GERD (gastroesophageal reflux disease)   . Arthritis     "both knees" (11/26/2013)  . Depression     "little bit" (11/26/2013)  . Anxiety     "little bit" (11/26/2013)  . COPD (chronic obstructive pulmonary disease)   . Pneumonia 11/2013    Past Surgical History  Procedure Laterality Date  . Hernia repair    . Cholecystectomy    . Bladder suspension      "w/hysterectomy"  . Hiatal hernia repair  X 2  . Abdominal hysterectomy    . Cataract extraction w/ intraocular lens  implant, bilateral Bilateral   . Appendectomy    . Hip arthroplasty Left 02/07/2014    Procedure: LEFT HIP PRESS FIT MONOPOLAR HEMIARTHROPLASTY ;  Surgeon: Marybelle Killings, MD;  Location: [facility];  Service: Orthopedics;  Laterality: Left;    Allergies  Allergen Reactions  . Penicillins Hives    Family history and social history have been reviewed.  Review of Systems: Negative for dysphagia. Chest pain or abdominal pain. Positive for occasional constipation.  The remainder of the 10  point ROS is negative except as outlined in the H&P  Physical Exam: General Appearance Well developed, in no distress, hard of hearing Eyes  Non icteric  HEENT  Non traumatic, normocephalic  Mouth No lesion, tongue papillated, no cheilosis Neck Supple without adenopathy, thyroid not enlarged, no carotid bruits, no JVD Lungs Clear to auscultation bilaterally , COR Normal S1, normal S2, regular rhythm, no murmur, quiet precordium Abdomen well-healed scar in epigastrium. Normoactive bowel sounds. No tenderness. Normal lower abdomen Rectal not done Extremities  1+ pedal edema Skin No lesions Neurological Alert and oriented x 3 Psychological Normal mood and affect  Assessment and Plan:   Problem #45 80 year old white female with a history of Barrett's esophagus and failed Nissen fundoplication, she is post Belsey gastropexy now with recurrent symptoms of gastroesophageal reflux. We will proceed with an upper endoscopy and biopsies. She will continue Nexium 40 mg twice a day and strict antireflux measures.Consider adding H2RA in mid day.    Delfin Edis 07/21/2014

## 2014-07-21 NOTE — Patient Instructions (Signed)
You have been scheduled for an endoscopy. Please follow written instructions given to you at your visit today. If you use inhalers (even only as needed), please bring them with you on the day of your procedure. Your physician has requested that you go to www.startemmi.com and enter the access code given to you at your visit today. This web site gives a general overview about your procedure. However, you should still follow specific instructions given to you by our office regarding your preparation for the procedure.  QQ:[id number] [place], [employer]

## 2014-07-22 ENCOUNTER — Encounter: Payer: Self-pay | Admitting: Internal Medicine

## 2014-08-31 NOTE — H&P (Signed)
   Expand All Collapse All   FREDDI FORSTER Feb 05, 1931 [id number]  Note: This dictation was prepared with Dragon digital system. Any transcriptional errors that result from this procedure are unintentional.   History of Present Illness:  This is an 81 year old white female with a history of Barrett's esophagus on upper endoscopy in 1999 and again in 2004. There was no Barrett's on 2007EGD.Marland Kitchen She had a failed Nissen fundoplication and subsequently, Belsey gastropexy which Has been functioning very well until several months ago.. She is here today because of breakthrough symptoms of gastroesophageal reflux and burning. She is on Nexium 40 mg twice a day. She denies dysphagia or odynophagia. She denies hoarseness or nocturnal cough. Her last colonoscopy in October 2007 showed diverticulosis.    Past Medical History  Diagnosis Date  . Hypertension   . High cholesterol   . Hypothyroidism   . Asthma     "only when I smoked"  . H/O hiatal hernia     "twice"  . GERD (gastroesophageal reflux disease)   . Arthritis     "both knees" (11/26/2013)  . Depression     "little bit" (11/26/2013)  . Anxiety     "little bit" (11/26/2013)  . COPD (chronic obstructive pulmonary disease)   . Pneumonia 11/2013    Past Surgical History  Procedure Laterality Date  . Hernia repair    . Cholecystectomy    . Bladder suspension      "w/hysterectomy"  . Hiatal hernia repair  X 2  . Abdominal hysterectomy    . Cataract extraction w/ intraocular lens implant, bilateral Bilateral   . Appendectomy    . Hip arthroplasty Left 02/07/2014    Procedure: LEFT HIP PRESS FIT MONOPOLAR HEMIARTHROPLASTY ; Surgeon: Marybelle Killings, MD; Location: [place]; Service: Orthopedics; Laterality: Left;    Allergies  Allergen Reactions  . Penicillins Hives    Family history and social history have been  reviewed.  Review of Systems: Negative for dysphagia. Chest pain or abdominal pain. Positive for occasional constipation.  The remainder of the 10 point ROS is negative except as outlined in the H&P  Physical Exam: General Appearance Well developed, in no distress, hard of hearing Eyes Non icteric  HEENT Non traumatic, normocephalic  Mouth No lesion, tongue papillated, no cheilosis Neck Supple without adenopathy, thyroid not enlarged, no carotid bruits, no JVD Lungs Clear to auscultation bilaterally , COR Normal S1, normal S2, regular rhythm, no murmur, quiet precordium Abdomen well-healed scar in epigastrium. Normoactive bowel sounds. No tenderness. Normal lower abdomen Rectal not done Extremities 1+ pedal edema Skin No lesions Neurological Alert and oriented x 3 Psychological Normal mood and affect  Assessment and Plan:   Problem #37 81 year old white female with a history of Barrett's esophagus and failed Nissen fundoplication, she is post Belsey gastropexy now with recurrent symptoms of gastroesophageal reflux. We will proceed with an upper endoscopy and biopsies. She will continue Nexium 40 mg twice a day and strict antireflux measures.Consider adding H2RA in mid day.    Delfin Edis 07/21/2014

## 2014-09-01 ENCOUNTER — Ambulatory Visit (HOSPITAL_COMMUNITY): Admission: EM | Admit: 2014-09-01 | Payer: Self-pay | Source: Ambulatory Visit | Admitting: Internal Medicine

## 2014-09-01 ENCOUNTER — Ambulatory Visit (HOSPITAL_COMMUNITY)
Admission: RE | Admit: 2014-09-01 | Discharge: 2014-09-01 | Disposition: A | Discharge status: Home / Self Care | Payer: Medicare Other | Source: Ambulatory Visit | Attending: Internal Medicine | Admitting: Internal Medicine

## 2014-09-01 ENCOUNTER — Encounter (HOSPITAL_COMMUNITY): Payer: Self-pay | Admitting: *Deleted

## 2014-09-01 ENCOUNTER — Telehealth: Payer: Self-pay | Admitting: *Deleted

## 2014-09-01 ENCOUNTER — Encounter (HOSPITAL_COMMUNITY): Admission: EM | Payer: Self-pay | Source: Ambulatory Visit

## 2014-09-01 ENCOUNTER — Other Ambulatory Visit: Payer: Self-pay | Admitting: *Deleted

## 2014-09-01 ENCOUNTER — Encounter (HOSPITAL_COMMUNITY)
Admission: RE | Disposition: A | Discharge status: Home / Self Care | Payer: Self-pay | Source: Ambulatory Visit | Attending: Internal Medicine

## 2014-09-01 DIAGNOSIS — J449 Chronic obstructive pulmonary disease, unspecified: Secondary | ICD-10-CM | POA: Diagnosis not present

## 2014-09-01 DIAGNOSIS — K219 Gastro-esophageal reflux disease without esophagitis: Secondary | ICD-10-CM

## 2014-09-01 DIAGNOSIS — M13862 Other specified arthritis, left knee: Secondary | ICD-10-CM | POA: Diagnosis not present

## 2014-09-01 DIAGNOSIS — E780 Pure hypercholesterolemia: Secondary | ICD-10-CM | POA: Diagnosis not present

## 2014-09-01 DIAGNOSIS — K2960 Other gastritis without bleeding: Secondary | ICD-10-CM | POA: Diagnosis not present

## 2014-09-01 DIAGNOSIS — E039 Hypothyroidism, unspecified: Secondary | ICD-10-CM | POA: Insufficient documentation

## 2014-09-01 DIAGNOSIS — M13861 Other specified arthritis, right knee: Secondary | ICD-10-CM | POA: Insufficient documentation

## 2014-09-01 DIAGNOSIS — K209 Esophagitis, unspecified: Secondary | ICD-10-CM | POA: Insufficient documentation

## 2014-09-01 DIAGNOSIS — F329 Major depressive disorder, single episode, unspecified: Secondary | ICD-10-CM | POA: Diagnosis not present

## 2014-09-01 DIAGNOSIS — K2270 Barrett's esophagus without dysplasia: Secondary | ICD-10-CM | POA: Insufficient documentation

## 2014-09-01 DIAGNOSIS — R12 Heartburn: Secondary | ICD-10-CM | POA: Diagnosis present

## 2014-09-01 DIAGNOSIS — I10 Essential (primary) hypertension: Secondary | ICD-10-CM | POA: Diagnosis not present

## 2014-09-01 DIAGNOSIS — J45909 Unspecified asthma, uncomplicated: Secondary | ICD-10-CM | POA: Insufficient documentation

## 2014-09-01 DIAGNOSIS — Z9889 Other specified postprocedural states: Secondary | ICD-10-CM | POA: Insufficient documentation

## 2014-09-01 HISTORY — PX: ESOPHAGOGASTRODUODENOSCOPY: SHX5428

## 2014-09-01 SURGERY — EGD (ESOPHAGOGASTRODUODENOSCOPY)
Anesthesia: Moderate Sedation

## 2014-09-01 MED ORDER — MIDAZOLAM HCL 10 MG/2ML IJ SOLN
INTRAMUSCULAR | Status: AC
  Filled 2014-09-01: qty 2

## 2014-09-01 MED ORDER — FENTANYL CITRATE 0.05 MG/ML IJ SOLN
INTRAMUSCULAR | Status: AC
  Filled 2014-09-01: qty 2

## 2014-09-01 MED ORDER — SODIUM CHLORIDE 0.9 % IV SOLN: INTRAVENOUS | Status: DC

## 2014-09-01 MED ORDER — FAMOTIDINE 40 MG PO TABS: 40.0000 mg | ORAL_TABLET | Freq: Every day | ORAL | Status: DC

## 2014-09-01 MED ORDER — BUTAMBEN-TETRACAINE-BENZOCAINE 2-2-14 % EX AERO
INHALATION_SPRAY | CUTANEOUS | Status: DC | PRN
  Administered 2014-09-01: 2 via TOPICAL

## 2014-09-01 MED ORDER — SODIUM CHLORIDE 0.9 % IV SOLN
INTRAVENOUS | Status: DC
  Administered 2014-09-01: 500 mL via INTRAVENOUS

## 2014-09-01 MED ORDER — FENTANYL CITRATE 0.05 MG/ML IJ SOLN
INTRAMUSCULAR | Status: DC | PRN
  Administered 2014-09-01 (×2): 25 ug via INTRAVENOUS

## 2014-09-01 MED ORDER — MIDAZOLAM HCL 10 MG/2ML IJ SOLN
INTRAMUSCULAR | Status: DC | PRN
  Administered 2014-09-01 (×2): 2 mg via INTRAVENOUS
  Administered 2014-09-01: 1 mg via INTRAVENOUS

## 2014-09-01 NOTE — Discharge Instructions (Signed)
Esophagogastroduodenoscopy °Care After °Refer to this sheet in the next few weeks. These instructions provide you with information on caring for yourself after your procedure. Your caregiver may also give you more specific instructions. Your treatment has been planned according to current medical practices, but problems sometimes occur. Call your caregiver if you have any problems or questions after your procedure.  °HOME CARE INSTRUCTIONS °· Do not eat or drink anything until the numbing medicine (local anesthetic) has worn off and your gag reflex has returned. You will know that the local anesthetic has worn off when you can swallow comfortably. °· Do not drive for 12 hours after the procedure or as directed by your caregiver. °· Only take medicines as directed by your caregiver. °SEEK MEDICAL CARE IF:  °· You cannot stop coughing. °· You are not urinating at all or less than usual. °SEEK IMMEDIATE MEDICAL CARE IF: °· You have difficulty swallowing. °· You cannot eat or drink. °· You have worsening throat or chest pain. °· You have dizziness, lightheadedness, or you faint. °· You have nausea or vomiting. °· You have chills. °· You have a fever. °· You have severe abdominal pain. °· You have black, tarry, or bloody stools. °Document Released: 06/27/2012 Document Reviewed: 06/27/2012 °ExitCare® Patient Information ©2015 [employer]. This information is not intended to replace advice given to you by your health care provider. Make sure you discuss any questions you have with your health care provider. ° °

## 2014-09-01 NOTE — Op Note (Signed)
[facility] [place], [address]   ENDOSCOPY PROCEDURE REPORT  PATIENT: Carolyn [name], Carolyn [name]  MR#: [id number] BIRTHDATE: July 21, 1931 , 83  yrs. old GENDER: female ENDOSCOPIST: Lafayette Dragon, MD REFERRED BY: Dr  Anselm Pancoast PROCEDURE DATE:  09/01/2014 PROCEDURE:  EGD w/ biopsy ASA CLASS:     Class III INDICATIONS:  heartburn and hx of Nissen fundoplication, Belsey gastropexy.Marland Kitchen MEDICATIONS: Monitored anesthesia care TOPICAL ANESTHETIC: none and Cetacaine Spray  DESCRIPTION OF PROCEDURE: After the risks benefits and alternatives of the procedure were thoroughly explained, informed consent was obtained.  The Pentax Gastroscope [id number] endoscope was introduced through the mouth and advanced to the second portion of the duodenum , Without limitations.  The instrument was slowly withdrawn as the mucosa was fully examined.    Esophagus: proximal and mid esophageal mucosa was normal. Distal esophagus showed acute as well as chronic esophagitis since were obtained to rule out Barrett's esophagus Stomach: there was retained food in the stomach. Was no hiatal hernia. Retroflexion of the endoscope shown no evidence of Nissen fundoplication. Gastric outlet was normal there was mild gastritis in gastric antrum  Duodenum: duodenal bulb and descending duodenum was normal[ The scope was then withdrawn from the patient and the procedure completed.  COMPLICATIONS: There were no immediate complications.  ENDOSCOPIC IMPRESSION: 1. possible failed antireflux procedure 2. Acute and chronic distal esophagitis, s/p biopsies 3. Retained gastric contents consistent with gastroparesis 4. mild antral gastritis. Status post biopsies to rule out H. pylori  RECOMMENDATIONS: 1.  Await biopsy results 2.  Anti-reflux regimen to be follow 3. Nexium 40 mg twice a day 4. Add Pepcid 40 mg in the middle of the day 5. Upper GI with barium esophagram to assess functionality  of antireflux procedure  REPEAT EXAM: for EGD pending biopsy results.  eSigned:  Lafayette Dragon, MD 09/01/2014 2:40 PM    CC:  PATIENT NAME:  Carolyn [name], Carolyn [name] MR#: [id number]

## 2014-09-01 NOTE — Telephone Encounter (Signed)
Per Dr. Olevia Perches, patient needs UGI and barium esophagram. Scheduled at [facility] radiology([place]) on 09/08/14 at 9:30 AM. NPO after midnight. Patient notified of appointment and instructions.

## 2014-09-01 NOTE — Interval H&P Note (Signed)
History and Physical Interval Note:  09/01/2014 8:07 AM  Carolyn [name]  has presented today for surgery, with the diagnosis of GERD  The various methods of treatment have been discussed with the patient and family. After consideration of risks, benefits and other options for treatment, the patient has consented to  Procedure(s): ESOPHAGOGASTRODUODENOSCOPY (EGD) (N/A) as a surgical intervention .  The patient's history has been reviewed, patient examined, no change in status, stable for surgery.  I have reviewed the patient's chart and labs.  Questions were answered to the patient's satisfaction.     Delfin Edis

## 2014-09-02 ENCOUNTER — Encounter (HOSPITAL_COMMUNITY): Payer: Self-pay | Admitting: Internal Medicine

## 2014-09-03 ENCOUNTER — Encounter: Payer: Self-pay | Admitting: Internal Medicine

## 2014-09-08 ENCOUNTER — Ambulatory Visit (HOSPITAL_COMMUNITY): Admission: RE | Admit: 2014-09-08 | Payer: Medicare Other | Source: Ambulatory Visit

## 2014-09-08 ENCOUNTER — Ambulatory Visit (HOSPITAL_COMMUNITY): Payer: Medicare Other

## 2014-09-15 ENCOUNTER — Ambulatory Visit (HOSPITAL_COMMUNITY)
Admission: RE | Admit: 2014-09-15 | Discharge: 2014-09-15 | Disposition: A | Discharge status: Home / Self Care | Payer: Medicare Other | Source: Ambulatory Visit | Attending: Internal Medicine | Admitting: Internal Medicine

## 2014-09-15 DIAGNOSIS — K228 Other specified diseases of esophagus: Secondary | ICD-10-CM | POA: Insufficient documentation

## 2014-09-15 DIAGNOSIS — K449 Diaphragmatic hernia without obstruction or gangrene: Secondary | ICD-10-CM | POA: Insufficient documentation

## 2014-09-15 DIAGNOSIS — K219 Gastro-esophageal reflux disease without esophagitis: Secondary | ICD-10-CM | POA: Insufficient documentation

## 2014-09-16 ENCOUNTER — Telehealth: Payer: Self-pay | Admitting: *Deleted

## 2014-09-16 NOTE — Telephone Encounter (Signed)
Patient's daughter given results and recommendations.

## 2014-09-16 NOTE — Telephone Encounter (Signed)
-----   Message from Lafayette Dragon, MD sent at 09/15/2014  1:51 PM EST ----- Regarding: UGI Rollene Fare, please call pt with results of Barium swallow and UGI. She is having lot of reflux again ( already had 2 surgeries), mostly in supine position. Also, she has a hernia ( paraesophageal),wich is intermittently blocking her esophagus, especially when she lays on her right side.  Food empties much better on her left side. She should try leaning to the left  When she experiences dysphagia. I don't recommend surgery again. Just take PPI's and try to always eat sitting up.

## 2014-09-16 NOTE — Telephone Encounter (Signed)
Left a message for patient to call back. 

## 2014-09-26 ENCOUNTER — Emergency Department (HOSPITAL_COMMUNITY)
Admission: EM | Admit: 2014-09-26 | Discharge: 2014-09-26 | Disposition: A | Discharge status: Home / Self Care | Payer: Medicare Other | Attending: Emergency Medicine | Admitting: Emergency Medicine

## 2014-09-26 ENCOUNTER — Encounter (HOSPITAL_COMMUNITY): Payer: Self-pay | Admitting: Emergency Medicine

## 2014-09-26 DIAGNOSIS — F329 Major depressive disorder, single episode, unspecified: Secondary | ICD-10-CM | POA: Diagnosis not present

## 2014-09-26 DIAGNOSIS — F419 Anxiety disorder, unspecified: Secondary | ICD-10-CM | POA: Insufficient documentation

## 2014-09-26 DIAGNOSIS — I10 Essential (primary) hypertension: Secondary | ICD-10-CM | POA: Diagnosis not present

## 2014-09-26 DIAGNOSIS — Z87891 Personal history of nicotine dependence: Secondary | ICD-10-CM | POA: Insufficient documentation

## 2014-09-26 DIAGNOSIS — E780 Pure hypercholesterolemia: Secondary | ICD-10-CM | POA: Insufficient documentation

## 2014-09-26 DIAGNOSIS — N289 Disorder of kidney and ureter, unspecified: Secondary | ICD-10-CM

## 2014-09-26 DIAGNOSIS — Z79899 Other long term (current) drug therapy: Secondary | ICD-10-CM | POA: Diagnosis not present

## 2014-09-26 DIAGNOSIS — K219 Gastro-esophageal reflux disease without esophagitis: Secondary | ICD-10-CM | POA: Diagnosis not present

## 2014-09-26 DIAGNOSIS — I959 Hypotension, unspecified: Secondary | ICD-10-CM | POA: Diagnosis present

## 2014-09-26 DIAGNOSIS — E039 Hypothyroidism, unspecified: Secondary | ICD-10-CM | POA: Insufficient documentation

## 2014-09-26 DIAGNOSIS — Z880 Allergy status to penicillin: Secondary | ICD-10-CM | POA: Insufficient documentation

## 2014-09-26 DIAGNOSIS — M1990 Unspecified osteoarthritis, unspecified site: Secondary | ICD-10-CM | POA: Insufficient documentation

## 2014-09-26 DIAGNOSIS — J449 Chronic obstructive pulmonary disease, unspecified: Secondary | ICD-10-CM | POA: Diagnosis not present

## 2014-09-26 DIAGNOSIS — E875 Hyperkalemia: Secondary | ICD-10-CM | POA: Insufficient documentation

## 2014-09-26 DIAGNOSIS — Z8701 Personal history of pneumonia (recurrent): Secondary | ICD-10-CM | POA: Diagnosis not present

## 2014-09-26 LAB — URINALYSIS, ROUTINE W REFLEX MICROSCOPIC
Bilirubin Urine: NEGATIVE
Glucose, UA: NEGATIVE mg/dL
Hgb urine dipstick: NEGATIVE
Ketones, ur: NEGATIVE mg/dL
Leukocytes, UA: NEGATIVE
Nitrite: NEGATIVE
Protein, ur: NEGATIVE mg/dL
Specific Gravity, Urine: 1.01 (ref 1.005–1.030)
Urobilinogen, UA: 0.2 mg/dL (ref 0.0–1.0)
pH: 6 (ref 5.0–8.0)

## 2014-09-26 LAB — CBC WITH DIFFERENTIAL/PLATELET
Basophils Absolute: 0 10*3/uL (ref 0.0–0.1)
Basophils Relative: 1 % (ref 0–1)
Eosinophils Absolute: 0.3 10*3/uL (ref 0.0–0.7)
Eosinophils Relative: 4 % (ref 0–5)
HCT: 32.5 % — ABNORMAL LOW (ref 36.0–46.0)
Hemoglobin: 10.5 g/dL — ABNORMAL LOW (ref 12.0–15.0)
Lymphocytes Relative: 17 % (ref 12–46)
Lymphs Abs: 1.3 10*3/uL (ref 0.7–4.0)
MCH: 30.6 pg (ref 26.0–34.0)
MCHC: 32.3 g/dL (ref 30.0–36.0)
MCV: 94.8 fL (ref 78.0–100.0)
Monocytes Absolute: 0.8 10*3/uL (ref 0.1–1.0)
Monocytes Relative: 10 % (ref 3–12)
Neutro Abs: 5.6 10*3/uL (ref 1.7–7.7)
Neutrophils Relative %: 70 % (ref 43–77)
Platelets: 314 10*3/uL (ref 150–400)
RBC: 3.43 MIL/uL — ABNORMAL LOW (ref 3.87–5.11)
RDW: 14.2 % (ref 11.5–15.5)
WBC: 8.1 10*3/uL (ref 4.0–10.5)

## 2014-09-26 LAB — COMPREHENSIVE METABOLIC PANEL
ALT: 18 U/L (ref 0–35)
AST: 31 U/L (ref 0–37)
Albumin: 3.1 g/dL — ABNORMAL LOW (ref 3.5–5.2)
Alkaline Phosphatase: 73 U/L (ref 39–117)
Anion gap: 9 (ref 5–15)
BUN: 57 mg/dL — ABNORMAL HIGH (ref 6–23)
CO2: 24 mmol/L (ref 19–32)
Calcium: 9.1 mg/dL (ref 8.4–10.5)
Chloride: 104 mmol/L (ref 96–112)
Creatinine, Ser: 1.71 mg/dL — ABNORMAL HIGH (ref 0.50–1.10)
GFR calc Af Amer: 31 mL/min — ABNORMAL LOW (ref >90)
GFR calc non Af Amer: 27 mL/min — ABNORMAL LOW (ref >90)
Glucose, Bld: 97 mg/dL (ref 70–99)
Potassium: 5.4 mmol/L — ABNORMAL HIGH (ref 3.5–5.1)
Sodium: 137 mmol/L (ref 135–145)
Total Bilirubin: 0.5 mg/dL (ref 0.3–1.2)
Total Protein: 7.5 g/dL (ref 6.0–8.3)

## 2014-09-26 MED ORDER — SODIUM CHLORIDE 0.9 % IV BOLUS (SEPSIS)
1000.0000 mL | Freq: Once | INTRAVENOUS | Status: AC
  Administered 2014-09-26: 1000 mL via INTRAVENOUS

## 2014-09-26 NOTE — Discharge Instructions (Signed)
Hyperkalemia Hyperkalemia means you have too much potassium in your blood. Potassium is a type of salt in the blood (electrolyte). Normally, your kidneys remove potassium from the body. Too much potassium can be life-threatening. HOME CARE  Only take medicine as told by your doctor.  Do not take vitamins or natural products unless your doctor says they are okay.  Keep all doctor visits as told.  Follow diet instructions as told by your doctor. GET HELP RIGHT AWAY IF:  Your heartbeat is not regular or very slow.  You feel dizzy (lightheaded).  You feel weak.  You are short of breath.  You have chest pain.  You pass out (faint). MAKE SURE YOU:   Understand these instructions.  Will watch your condition.  Will get help right away if you are not doing well or get worse. Document Released: 07/11/2005 Document Revised: 10/03/2011 Document Reviewed: 10/16/2013 [facility] Patient Information 2015 [place], [place]. This information is not intended to replace advice given to you by your health care provider. Make sure you discuss any questions you have with your health care provider.  Kidney Failure Kidney failure happens when the kidneys cannot remove waste and excess fluid that naturally builds up in your blood after your body breaks down food. This leads to a dangerous buildup of waste products and fluid in the blood. HOME CARE  Follow your diet as told by your doctor.  Take all medicines as told by your doctor.  Keep all of your dialysis appointments. Call if you are unable to keep an appointment. GET HELP RIGHT AWAY IF:   You make a lot more or very little pee (urine).  Your face or ankles puff up (swell).  You develop shortness of breath.  You develop weakness, feel tired, or you do not feel hungry (appetite loss).  You feel poorly for no known reason. MAKE SURE YOU:   Understand these instructions.  Will watch your condition.  Will get help right away if you are  not doing well or get worse. Document Released: 10/05/2009 Document Revised: 10/03/2011 Document Reviewed: 11/11/2009 [employer] Patient Information 2015 [place], [place]. This information is not intended to replace advice given to you by your health care provider. Make sure you discuss any questions you have with your health care provider.

## 2014-09-26 NOTE — ED Notes (Signed)
Pt here for increased K+ from PCP and noted to be hypotensive

## 2014-09-26 NOTE — ED Provider Notes (Signed)
CSN: [id number]     Arrival date & time 09/26/14  1306 History   First MD Initiated Contact with Patient 09/26/14 1335     Chief Complaint  Patient presents with  . Abnormal Lab  . Hypotension     (Consider location/radiation/quality/duration/timing/severity/associated sxs/prior Treatment) HPI   83yF abnormal labs. Additional history provided by daughter. Reports told that potassium was high and needed to go to ER. Pt with no complaints. Denies recent med changes. Admits to generally poor appetite and doesn't drink much. Denies dizziness, lightheadedness or SOB. Urinary output somewhat decreased but is making urine. No change in mental status per daughter.   Past Medical History  Diagnosis Date  . Hypertension   . High cholesterol   . Hypothyroidism   . Asthma     "only when I smoked"  . H/O hiatal hernia     "twice"  . GERD (gastroesophageal reflux disease)   . Arthritis     "both knees" (11/26/2013)  . Depression     "little bit" (11/26/2013)  . Anxiety     "little bit" (11/26/2013)  . COPD (chronic obstructive pulmonary disease)   . Pneumonia 11/2013   Past Surgical History  Procedure Laterality Date  . Hernia repair    . Cholecystectomy    . Bladder suspension      "w/hysterectomy"  . Hiatal hernia repair  X 2  . Abdominal hysterectomy    . Cataract extraction w/ intraocular lens  implant, bilateral Bilateral   . Appendectomy    . Hip arthroplasty Left 02/07/2014    Procedure: LEFT HIP PRESS FIT MONOPOLAR HEMIARTHROPLASTY ;  Surgeon: Marybelle Killings, MD;  Location: [place];  Service: Orthopedics;  Laterality: Left;  . Esophagogastroduodenoscopy N/A 09/01/2014    Procedure: ESOPHAGOGASTRODUODENOSCOPY (EGD);  Surgeon: Lafayette Dragon, MD;  Location: [facility];  Service: Endoscopy;  Laterality: N/A;   Family History  Problem Relation Age of Onset  . Stomach cancer Mother   . Heart disease Father   . Heart disease Brother   . Bladder Cancer Sister    History  Substance Use  Topics  . Smoking status: Former Smoker -- 0.25 packs/day for 40 years    Types: Cigarettes  . Smokeless tobacco: Never Used     Comment: "quit smoking ~ 2005"  . Alcohol Use: No     Comment: 11/26/2013 "drank a little alcohol; last drink was a long time ago"   OB History    No data available     Review of Systems  All systems reviewed and negative, other than as noted in HPI.   Allergies  Penicillins  Home Medications   Prior to Admission medications   Medication Sig Start Date End Date Taking? Authorizing Provider  ALPRAZolam Duanne Moron) 1 MG tablet Take 1 mg by mouth at bedtime as needed for anxiety.  11/01/13   Historical Provider, MD  bisacodyl (DULCOLAX) 10 MG suppository Place 1 suppository (10 mg total) rectally daily as needed for moderate constipation. 02/10/14   Thurnell Lose, MD  calcium carbonate (CALCIUM 600) 600 MG TABS tablet Take 600 mg by mouth.    Historical Provider, MD  Calcium Carbonate-Vitamin D (CALTRATE 600+D) 600-400 MG-UNIT per tablet Take 1 tablet by mouth daily.    Historical Provider, MD  CRESTOR 10 MG tablet Take 10 mg by mouth daily.  09/10/13   Historical Provider, MD  docusate sodium 100 MG CAPS Take 100 mg by mouth 2 (two) times daily. 02/10/14   Prashant  Jennette Kettle, MD  famotidine (PEPCID) 40 MG tablet Take 1 tablet (40 mg total) by mouth daily at 12 noon. 09/01/14   Lafayette Dragon, MD  gabapentin (NEURONTIN) 300 MG capsule Take 300 mg by mouth. 05/12/14 05/12/15  Historical Provider, MD  HYDROcodone-acetaminophen (NORCO/VICODIN) 5-325 MG per tablet Take 1 tablet by mouth every 6 (six) hours as needed for moderate pain. 02/10/14   Thurnell Lose, MD  irbesartan (AVAPRO) 300 MG tablet Take 300 mg by mouth daily.    Historical Provider, MD  levothyroxine (SYNTHROID, LEVOTHROID) 88 MCG tablet TAKE ONE TABLET BY MOUTH EVERY MORNING 05/12/14   Historical Provider, MD  MAGNESIUM PO Take 1 tablet by mouth daily.    Historical Provider, MD  metoprolol succinate  (TOPROL-XL) 50 MG 24 hr tablet Take 50 mg by mouth daily.  09/10/13   Historical Provider, MD  NEXIUM 40 MG capsule Take 40 mg by mouth 2 (two) times daily before a meal.  10/03/13   Historical Provider, MD  Omega-3 Fatty Acids (FISH OIL PO) Take 1 capsule by mouth daily.    Historical Provider, MD  ondansetron (ZOFRAN) 4 MG tablet Take 1 tablet (4 mg total) by mouth once. Patient taking differently: Take 4 mg by mouth every 8 (eight) hours as needed.  02/10/14   Thurnell Lose, MD  oxyCODONE-acetaminophen (PERCOCET/ROXICET) 5-325 MG per tablet 1 tablet every 6 (six) hours as needed (pain).  09/30/13   Historical Provider, MD  polyethylene glycol (MIRALAX / GLYCOLAX) packet Take 17 g by mouth 2 (two) times daily. Patient taking differently: Take 17 g by mouth daily as needed.  02/10/14   Thurnell Lose, MD  sulfamethoxazole-trimethoprim (BACTRIM,SEPTRA) 400-80 MG per tablet Take by mouth. 05/12/14   Historical Provider, MD   BP 96/62 mmHg  Pulse 87  Temp(Src) 98.3 F (36.8 C) (Oral)  Resp 15  SpO2 99% Physical Exam  Constitutional: She appears well-developed and well-nourished. No distress.  HENT:  Head: Normocephalic and atraumatic.  Eyes: Conjunctivae are normal. Right eye exhibits no discharge. Left eye exhibits no discharge.  Neck: Neck supple.  Cardiovascular: Normal rate, regular rhythm and normal heart sounds.  Exam reveals no gallop and no friction rub.   No murmur heard. Pulmonary/Chest: Effort normal and breath sounds normal. No respiratory distress.  Abdominal: Soft. She exhibits no distension. There is no tenderness.  Musculoskeletal: She exhibits no edema or tenderness.  Neurological: She is alert.  Skin: Skin is warm and dry.  Psychiatric: She has a normal mood and affect. Her behavior is normal. Thought content normal.  Nursing note and vitals reviewed.   ED Course  Procedures (including critical care time) Labs Review Labs Reviewed  CBC WITH DIFFERENTIAL/PLATELET -  Abnormal; Notable for the following:    RBC 3.43 (*)    Hemoglobin 10.5 (*)    HCT 32.5 (*)    All other components within normal limits  COMPREHENSIVE METABOLIC PANEL - Abnormal; Notable for the following:    Potassium 5.4 (*)    BUN 57 (*)    Creatinine, Ser 1.71 (*)    Albumin 3.1 (*)    GFR calc non Af Amer 27 (*)    GFR calc Af Amer 31 (*)    All other components within normal limits    Imaging Review No results found.   EKG Interpretation None      MDM   Final diagnoses:  Renal impairment  Hyperkalemia    83yF with BUN 57/Cr 1.71 and  K of 5.4. Pt/daughter reports poor PO intake. No acute complaints. Mild hypotension. Given 2L IVF. At this point, I do not feel she needs admission for hospital but does need close outpt FU and repeat BMP in a few days. Encouraged to push fluids. Reports PCP referred to [facility] but yet to make appointment.     Virgel Manifold, MD 09/27/14 [phone]

## 2014-09-26 NOTE — ED Notes (Signed)
Pt wheeled back to room. Pt placed into gown and on monitor. Pt monitored by blood pressure, pulse ox, and 5 lead.

## 2014-10-01 ENCOUNTER — Other Ambulatory Visit (HOSPITAL_COMMUNITY): Payer: Self-pay | Admitting: Orthopaedic Surgery

## 2014-10-01 DIAGNOSIS — M79662 Pain in left lower leg: Secondary | ICD-10-CM

## 2014-10-01 DIAGNOSIS — M7989 Other specified soft tissue disorders: Secondary | ICD-10-CM

## 2014-10-02 ENCOUNTER — Observation Stay (HOSPITAL_COMMUNITY)
Admission: EM | Admit: 2014-10-02 | Discharge: 2014-10-03 | Disposition: A | Discharge status: Home / Self Care | Payer: Medicare Other | Attending: Family Medicine | Admitting: Family Medicine

## 2014-10-02 ENCOUNTER — Encounter (HOSPITAL_COMMUNITY): Payer: Self-pay | Admitting: Physical Medicine and Rehabilitation

## 2014-10-02 ENCOUNTER — Other Ambulatory Visit: Payer: Self-pay

## 2014-10-02 ENCOUNTER — Ambulatory Visit (HOSPITAL_BASED_OUTPATIENT_CLINIC_OR_DEPARTMENT_OTHER)
Admission: RE | Admit: 2014-10-02 | Discharge: 2014-10-02 | Disposition: A | Discharge status: Home / Self Care | Payer: Medicare Other | Source: Ambulatory Visit | Attending: Cardiology | Admitting: Cardiology

## 2014-10-02 DIAGNOSIS — I82402 Acute embolism and thrombosis of unspecified deep veins of left lower extremity: Secondary | ICD-10-CM

## 2014-10-02 DIAGNOSIS — M79662 Pain in left lower leg: Secondary | ICD-10-CM

## 2014-10-02 DIAGNOSIS — I82419 Acute embolism and thrombosis of unspecified femoral vein: Principal | ICD-10-CM | POA: Insufficient documentation

## 2014-10-02 DIAGNOSIS — Z86718 Personal history of other venous thrombosis and embolism: Secondary | ICD-10-CM | POA: Diagnosis present

## 2014-10-02 DIAGNOSIS — N179 Acute kidney failure, unspecified: Secondary | ICD-10-CM

## 2014-10-02 DIAGNOSIS — D649 Anemia, unspecified: Secondary | ICD-10-CM | POA: Insufficient documentation

## 2014-10-02 DIAGNOSIS — M7989 Other specified soft tissue disorders: Secondary | ICD-10-CM

## 2014-10-02 LAB — CBC
HCT: 25.7 % — ABNORMAL LOW (ref 36.0–46.0)
HEMOGLOBIN: 8.1 g/dL — AB (ref 12.0–15.0)
MCH: 29.2 pg (ref 26.0–34.0)
MCHC: 31.5 g/dL (ref 30.0–36.0)
MCV: 92.8 fL (ref 78.0–100.0)
Platelets: 368 10*3/uL (ref 150–400)
RBC: 2.77 MIL/uL — AB (ref 3.87–5.11)
RDW: 13.9 % (ref 11.5–15.5)
WBC: 7.9 10*3/uL (ref 4.0–10.5)

## 2014-10-02 LAB — BASIC METABOLIC PANEL
Anion gap: 5 (ref 5–15)
BUN: 33 mg/dL — AB (ref 6–23)
CHLORIDE: 107 mmol/L (ref 96–112)
CO2: 24 mmol/L (ref 19–32)
Calcium: 9.1 mg/dL (ref 8.4–10.5)
Creatinine, Ser: 1.18 mg/dL — ABNORMAL HIGH (ref 0.50–1.10)
GFR calc non Af Amer: 42 mL/min — ABNORMAL LOW (ref >90)
GFR, EST AFRICAN AMERICAN: 49 mL/min — AB (ref >90)
Glucose, Bld: 100 mg/dL — ABNORMAL HIGH (ref 70–99)
Potassium: 5.5 mmol/L — ABNORMAL HIGH (ref 3.5–5.1)
Sodium: 136 mmol/L (ref 135–145)

## 2014-10-02 LAB — D-DIMER, QUANTITATIVE (NOT AT ARMC): D DIMER QUANT: 7.56 ug{FEU}/mL — AB (ref 0.00–0.48)

## 2014-10-02 LAB — PROTIME-INR
INR: 1.11 (ref 0.00–1.49)
Prothrombin Time: 14.5 seconds (ref 11.6–15.2)

## 2014-10-02 LAB — APTT: aPTT: 30 seconds (ref 24–37)

## 2014-10-02 LAB — POC OCCULT BLOOD, ED: Fecal Occult Bld: NEGATIVE

## 2014-10-02 MED ORDER — GABAPENTIN 300 MG PO CAPS
300.0000 mg | ORAL_CAPSULE | Freq: Every day | ORAL | Status: DC
  Administered 2014-10-02: 300 mg via ORAL
  Filled 2014-10-02: qty 1

## 2014-10-02 MED ORDER — SODIUM CHLORIDE 0.9 % IJ SOLN
3.0000 mL | Freq: Two times a day (BID) | INTRAMUSCULAR | Status: DC
Start: 2014-10-02 — End: 2014-10-03
  Administered 2014-10-02: 3 mL via INTRAVENOUS

## 2014-10-02 MED ORDER — HYDROCODONE-ACETAMINOPHEN 5-325 MG PO TABS: 1.0000 | ORAL_TABLET | Freq: Four times a day (QID) | ORAL | Status: DC | PRN

## 2014-10-02 MED ORDER — LEVOTHYROXINE SODIUM 88 MCG PO TABS
88.0000 ug | ORAL_TABLET | Freq: Every day | ORAL | Status: DC
  Administered 2014-10-03: 88 ug via ORAL
  Filled 2014-10-02: qty 1

## 2014-10-02 MED ORDER — PANTOPRAZOLE SODIUM 40 MG PO TBEC: 80.0000 mg | DELAYED_RELEASE_TABLET | Freq: Every day | ORAL | Status: DC

## 2014-10-02 MED ORDER — SODIUM CHLORIDE 0.9 % IV SOLN: 250.0000 mL | INTRAVENOUS | Status: DC | PRN

## 2014-10-02 MED ORDER — APIXABAN 5 MG PO TABS
10.0000 mg | ORAL_TABLET | Freq: Two times a day (BID) | ORAL | Status: DC
Start: 2014-10-02 — End: 2014-10-03
  Administered 2014-10-03: 10 mg via ORAL
  Filled 2014-10-02 (×2): qty 2

## 2014-10-02 MED ORDER — HYDROCODONE-ACETAMINOPHEN 5-325 MG PO TABS
2.0000 | ORAL_TABLET | Freq: Once | ORAL | Status: AC
  Administered 2014-10-02: 2 via ORAL
  Filled 2014-10-02: qty 2

## 2014-10-02 MED ORDER — SODIUM CHLORIDE 0.9 % IJ SOLN: 3.0000 mL | INTRAMUSCULAR | Status: DC | PRN

## 2014-10-02 MED ORDER — METOPROLOL SUCCINATE ER 50 MG PO TB24
50.0000 mg | ORAL_TABLET | Freq: Every day | ORAL | Status: DC
  Administered 2014-10-03: 50 mg via ORAL
  Filled 2014-10-02: qty 1

## 2014-10-02 MED ORDER — DOCUSATE SODIUM 100 MG PO CAPS: 100.0000 mg | ORAL_CAPSULE | Freq: Two times a day (BID) | ORAL | Status: DC | PRN

## 2014-10-02 MED ORDER — ROSUVASTATIN CALCIUM 10 MG PO TABS
10.0000 mg | ORAL_TABLET | Freq: Every day | ORAL | Status: DC
  Administered 2014-10-02 – 2014-10-03 (×2): 10 mg via ORAL
  Filled 2014-10-02 (×2): qty 1

## 2014-10-02 NOTE — ED Notes (Signed)
Attempted report Xs 1.  

## 2014-10-02 NOTE — H&P (Signed)
[facility] Admission History and Physical Service Pager: [phone]  Patient name: Carolyn [name] Medical record number: [id number] Date of birth: 05-30-1931 Age: 81 y.o. Gender: female  Primary Care Provider: Elisabeth Cara, PA-C Consultants: None Code Status: Full  Chief Complaint: Left leg pain  Assessment and Plan: Carolyn [name] is a 81 y.o. female presenting with left lower leg swelling and pain. PMH hypertension, hypothyroidism, GERD, and Barrett's esophagus.  DVT: Left lower extremity venous duplex showing acute DVT in the common femoral vein, proximal mid and distal superficial femoral vein, popliteal vein, gastroc veins, and posterior tibial veins. There is also evidence for acute SVT in the small saphenous vein. Patient with lower extremity swelling, erythema, pain, and warmth. Patient was seeing Dr. Lorin Mercy for h/o hip fracture (Jun 2015) s/p L hip replacement. -Admit to floor under Dr. Mingo Amber -Eliquis 10 mg BID for 7 days, then 5 mg BID -Norco prn for pain control -Monitor for any signs of respiratory distress -Vitals per floor protocol  Anemia: Hemoglobin baseline appears to be 12. Today hemoglobin 8.1. FOBT negative. Last colonoscopy 2007. Anemia possibly secondary to DVTs with large clot burden. -repeat CBC in a.m. -Monitor for signs of bleeding -patient now on Eliquis for VTE treatment  AKI vs CKD?: Unsure of what patient's baseline renal function is. It appears that she has had elevated BUN/creatinine within the last 2 months. Prior studies before 2015 with normal functioning. Patient states that she follows nephrology but no notes available.  -BMP in a.m. -Will continue to monitor -Encourage PO fluids - hyperkalemia to 5.5  HTN: Normotensive blood pressures. -Continuing home metoprolol -Continuous monitoring, adjust medication if needed  Hypothyrodism: Last TSH 04/14/2014 2.75. -Continue home Synthroid  FEN/GI: Regular  diet/ SL IV Prophylaxis: Eliquis for treatment of DVT  Disposition: Admit to FPTS  History of Present Illness: Carolyn [name] is a 81 y.o. female presenting with left lower leg swelling and pain. PMH hypertension, hypothyroidism, GERD, and Barrett's esophagus.  Patient states that for the last 1-2 weeks she's been having pain and swelling in her legs. She has been trying to make an appointment with Dr. Lorin Mercy her orthopedic surgeon. She was able to get into see him yesterday. During that visit physician ordered compression stockings and venous duplex. Patient came hospital to have venous duplex performed and was found to have multiple DVTs. Pain worsened by movement. Pain score 8/10.  In the ED patient was noted to have hemoglobin drop from 10.5 to 8.1. Patient denies any bleeding, SOB, chest pain. She does endorse cold symptoms, tiredness, and dizziness. She does endorse fatigue and dizziness for the past few months.  Review Of Systems: Per HPI. Otherwise 12 point review of systems was performed and was unremarkable.  Patient Active Problem List   Diagnosis Date Noted  . Barrett's esophagus   . Esophageal reflux   . Hip fracture 02/07/2014  . Unspecified hypothyroidism 02/07/2014  . Essential hypertension, malignant 02/07/2014  . GERD (gastroesophageal reflux disease) 02/07/2014  . Other and unspecified hyperlipidemia 02/07/2014  . CAP (community acquired pneumonia) 11/27/2013  . Hyperthyroidism 11/27/2013  . Weakness 11/27/2013  . Acute encephalopathy 11/27/2013  . Acute thyrotoxicosis 11/27/2013  . Hypothermia 11/26/2013   Past Medical History: Past Medical History  Diagnosis Date  . Hypertension   . High cholesterol   . Hypothyroidism   . Asthma     "only when I smoked"  . H/O hiatal hernia     "twice"  .  GERD (gastroesophageal reflux disease)   . Arthritis     "both knees" (11/26/2013)  . Depression     "little bit" (11/26/2013)  . Anxiety     "little bit" (11/26/2013)   . COPD (chronic obstructive pulmonary disease)   . Pneumonia 11/2013   Past Surgical History: Past Surgical History  Procedure Laterality Date  . Hernia repair    . Cholecystectomy    . Bladder suspension      "w/hysterectomy"  . Hiatal hernia repair  X 2  . Abdominal hysterectomy    . Cataract extraction w/ intraocular lens  implant, bilateral Bilateral   . Appendectomy    . Hip arthroplasty Left 02/07/2014    Procedure: LEFT HIP PRESS FIT MONOPOLAR HEMIARTHROPLASTY ;  Surgeon: Marybelle Killings, MD;  Location: [place];  Service: Orthopedics;  Laterality: Left;  . Esophagogastroduodenoscopy N/A 09/01/2014    Procedure: ESOPHAGOGASTRODUODENOSCOPY (EGD);  Surgeon: Lafayette Dragon, MD;  Location: [facility];  Service: Endoscopy;  Laterality: N/A;   Social History: History  Substance Use Topics  . Smoking status: Former Smoker -- 0.25 packs/day for 40 years    Types: Cigarettes  . Smokeless tobacco: Never Used     Comment: "quit smoking ~ 2005"  . Alcohol Use: No     Comment: 11/26/2013 "drank a little alcohol; last drink was a long time ago"   Additional social history: Lives at home alone, Daughter DPOA Please also refer to relevant sections of EMR.  Family History: Family History  Problem Relation Age of Onset  . Stomach cancer Mother   . Heart disease Father   . Heart disease Brother   . Bladder Cancer Sister    Allergies and Medications: Allergies  Allergen Reactions  . Penicillins Hives   No current facility-administered medications on file prior to encounter.   Current Outpatient Prescriptions on File Prior to Encounter  Medication Sig Dispense Refill  . ALPRAZolam (XANAX) 1 MG tablet Take 1 mg by mouth at bedtime as needed for anxiety.     . bisacodyl (DULCOLAX) 10 MG suppository Place 1 suppository (10 mg total) rectally daily as needed for moderate constipation. 20 suppository 0  . calcium carbonate (CALCIUM 600) 600 MG TABS tablet Take 600 mg by mouth.    . Calcium  Carbonate-Vitamin D (CALTRATE 600+D) 600-400 MG-UNIT per tablet Take 1 tablet by mouth daily.    . CRESTOR 10 MG tablet Take 10 mg by mouth daily.     . famotidine (PEPCID) 40 MG tablet Take 1 tablet (40 mg total) by mouth daily at 12 noon. 90 tablet 3  . gabapentin (NEURONTIN) 300 MG capsule Take 300 mg by mouth.    Marland Kitchen HYDROcodone-acetaminophen (NORCO/VICODIN) 5-325 MG per tablet Take 1 tablet by mouth every 6 (six) hours as needed for moderate pain. 20 tablet 0  . levothyroxine (SYNTHROID, LEVOTHROID) 88 MCG tablet TAKE ONE TABLET BY MOUTH EVERY MORNING    . MAGNESIUM PO Take 1 tablet by mouth daily.    . metoprolol succinate (TOPROL-XL) 50 MG 24 hr tablet Take 50 mg by mouth daily.     Marland Kitchen NEXIUM 40 MG capsule Take 40 mg by mouth 2 (two) times daily before a meal.     . Omega-3 Fatty Acids (FISH OIL PO) Take 1 capsule by mouth daily.    . ondansetron (ZOFRAN) 4 MG tablet Take 1 tablet (4 mg total) by mouth once. (Patient taking differently: Take 4 mg by mouth every 8 (eight) hours as needed. )  30 tablet 0  . polyethylene glycol (MIRALAX / GLYCOLAX) packet Take 17 g by mouth 2 (two) times daily. (Patient taking differently: Take 17 g by mouth daily as needed. ) 14 each 0  . docusate sodium 100 MG CAPS Take 100 mg by mouth 2 (two) times daily. (Patient taking differently: Take 100 mg by mouth 2 (two) times daily as needed. ) 25 capsule 0    Objective: BP 140/84 mmHg  Pulse 73  Temp(Src) 98.7 F (37.1 C) (Oral)  Resp 16  Ht 5\' 7"  (1.702 m)  Wt 163 lb (73.936 kg)  BMI 25.52 kg/m2  SpO2 100% Exam: General: elderly, alert, well-appearing, NAD, cooperative Head: normocephalic and atraumatic.  Eyes: vision grossly intact, no injection and anicteric. Mouth: MMM, Oral mucosa and oropharynx reveal no lesions or exudates.  Lungs: CTAB, normal respiratory effort, no crackles, and no wheezes.  Heart: RRR, no M/R/G.  Abdomen: Bowel sounds normal; abdomen soft and nontender. No masses, organomegaly  or hernias noted. Extremities: LLE with erythema, warmth, and swelling to hip. Unable to palpate DP/PT pulses. Neurologic: No focal deficits, A&Ox3.  Skin: Intact without suspicious lesions or rashes. Varicose veins noted on RLE. Warm and dry. Psych: Mood and affect are normal; no evidence of anxiety or depression.  Labs and Imaging: Results for orders placed or performed during the hospital encounter of 10/02/14 (from the past 24 hour(s))  APTT     Status: None   Collection Time: 10/02/14 12:11 PM  Result Value Ref Range   aPTT 30 24 - 37 seconds  Basic metabolic panel     Status: Abnormal   Collection Time: 10/02/14 12:11 PM  Result Value Ref Range   Sodium 136 135 - 145 mmol/L   Potassium 5.5 (H) 3.5 - 5.1 mmol/L   Chloride 107 96 - 112 mmol/L   CO2 24 19 - 32 mmol/L   Glucose, Bld 100 (H) 70 - 99 mg/dL   BUN 33 (H) 6 - 23 mg/dL   Creatinine, Ser 1.18 (H) 0.50 - 1.10 mg/dL   Calcium 9.1 8.4 - 10.5 mg/dL   GFR calc non Af Amer 42 (L) >90 mL/min   GFR calc Af Amer 49 (L) >90 mL/min   Anion gap 5 5 - 15  CBC     Status: Abnormal   Collection Time: 10/02/14 12:11 PM  Result Value Ref Range   WBC 7.9 4.0 - 10.5 K/uL   RBC 2.77 (L) 3.87 - 5.11 MIL/uL   Hemoglobin 8.1 (L) 12.0 - 15.0 g/dL   HCT 25.7 (L) 36.0 - 46.0 %   MCV 92.8 78.0 - 100.0 fL   MCH 29.2 26.0 - 34.0 pg   MCHC 31.5 30.0 - 36.0 g/dL   RDW 13.9 11.5 - 15.5 %   Platelets 368 150 - 400 K/uL  D-dimer, quantitative     Status: Abnormal   Collection Time: 10/02/14 12:11 PM  Result Value Ref Range   D-Dimer, Quant 7.56 (H) 0.00 - 0.48 ug/mL-FEU  Protime-INR     Status: None   Collection Time: 10/02/14 12:11 PM  Result Value Ref Range   Prothrombin Time 14.5 11.6 - 15.2 seconds   INR 1.11 0.00 - 1.49  POC occult blood, ED Provider will collect     Status: None   Collection Time: 10/02/14  2:08 PM  Result Value Ref Range   Fecal Occult Bld NEGATIVE NEGATIVE    Katheren Shams, DO 10/02/2014, 3:11 PM PGY-1, [place]  [place] Intern pager: [phone], text pages welcome  FPTS Upper-Level Resident Addendum  I have independently interviewed and examined the patient. I have discussed the above with the original author and agree with their documentation. My edits for correction/addition/clarification are in pink. Please see also any attending notes.   Frazier Richards, MD PGY-2, [place] Service pager: [phone] (text pages welcome through [facility])

## 2014-10-02 NOTE — ED Notes (Signed)
Pt presents to department for evaluation of DVT to L leg. Had ultrasound performed of L leg today and positive for DVT. Pt reports 8/10 pain, increases with movement. Pt is alert and oriented x4.

## 2014-10-02 NOTE — ED Provider Notes (Signed)
CSN: [id number]     Arrival date & time 10/02/14  1041 History   First MD Initiated Contact with Patient 10/02/14 1122     Chief Complaint  Patient presents with  . DVT     (Consider location/radiation/quality/duration/timing/severity/associated sxs/prior Treatment) Patient is a 81 y.o. female presenting with leg pain.  Leg Pain Location:  Leg Time since incident:  3 weeks (no incident, spontaneous) Injury: no   Leg location:  L lower leg Pain details:    Quality:  Aching   Radiates to:  Does not radiate   Severity:  Moderate   Onset quality:  Gradual   Duration:  3 weeks   Timing:  Constant   Progression:  Unchanged Chronicity:  New Dislocation: no   Foreign body present:  No foreign bodies Tetanus status:  Up to date Prior injury to area:  No (recent hip replacement) Relieved by:  Nothing Worsened by:  Nothing tried Ineffective treatments:  None tried Associated symptoms: swelling   Associated symptoms: no back pain, no decreased ROM, no fatigue, no fever and no neck pain   Risk factors: no concern for non-accidental trauma, no known bone disorder and no obesity     Past Medical History  Diagnosis Date  . Hypertension   . High cholesterol   . Hypothyroidism   . Asthma     "only when I smoked"  . H/O hiatal hernia     "twice"  . GERD (gastroesophageal reflux disease)   . Arthritis     "both knees" (11/26/2013)  . Depression     "little bit" (11/26/2013)  . Anxiety     "little bit" (11/26/2013)  . COPD (chronic obstructive pulmonary disease)   . Pneumonia 11/2013   Past Surgical History  Procedure Laterality Date  . Hernia repair    . Cholecystectomy    . Bladder suspension      "w/hysterectomy"  . Hiatal hernia repair  X 2  . Abdominal hysterectomy    . Cataract extraction w/ intraocular lens  implant, bilateral Bilateral   . Appendectomy    . Hip arthroplasty Left 02/07/2014    Procedure: LEFT HIP PRESS FIT MONOPOLAR HEMIARTHROPLASTY ;  Surgeon: Marybelle Killings, MD;  Location: [place];  Service: Orthopedics;  Laterality: Left;  . Esophagogastroduodenoscopy N/A 09/01/2014    Procedure: ESOPHAGOGASTRODUODENOSCOPY (EGD);  Surgeon: Lafayette Dragon, MD;  Location: [facility];  Service: Endoscopy;  Laterality: N/A;   Family History  Problem Relation Age of Onset  . Stomach cancer Mother   . Heart disease Father   . Heart disease Brother   . Bladder Cancer Sister    History  Substance Use Topics  . Smoking status: Former Smoker -- 0.25 packs/day for 40 years    Types: Cigarettes  . Smokeless tobacco: Never Used     Comment: "quit smoking ~ 2005"  . Alcohol Use: No     Comment: 11/26/2013 "drank a little alcohol; last drink was a long time ago"   OB History    No data available     Review of Systems  Constitutional: Negative for fever, chills, diaphoresis, activity change, appetite change and fatigue.  HENT: Negative for congestion, facial swelling, rhinorrhea and sore throat.   Eyes: Negative for photophobia and discharge.  Respiratory: Negative for cough, chest tightness and shortness of breath.   Cardiovascular: Positive for leg swelling. Negative for chest pain and palpitations.  Gastrointestinal: Negative for nausea, vomiting, abdominal pain and diarrhea.  Endocrine: Negative for  polydipsia and polyuria.  Genitourinary: Negative for dysuria, frequency, difficulty urinating and pelvic pain.  Musculoskeletal: Negative for back pain, arthralgias, neck pain and neck stiffness.  Skin: Negative for color change and wound.  Allergic/Immunologic: Negative for immunocompromised state.  Neurological: Negative for facial asymmetry, weakness, numbness and headaches.  Hematological: Does not bruise/bleed easily.  Psychiatric/Behavioral: Negative for confusion and agitation.      Allergies  Penicillins  Home Medications   Prior to Admission medications   Medication Sig Start Date End Date Taking? Authorizing Provider  ALPRAZolam Duanne Moron) 1  MG tablet Take 1 mg by mouth at bedtime as needed for anxiety.  11/01/13  Yes Historical Provider, MD  bisacodyl (DULCOLAX) 10 MG suppository Place 1 suppository (10 mg total) rectally daily as needed for moderate constipation. 02/10/14  Yes Thurnell Lose, MD  calcium carbonate (CALCIUM 600) 600 MG TABS tablet Take 600 mg by mouth.   Yes Historical Provider, MD  Calcium Carbonate-Vitamin D (CALTRATE 600+D) 600-400 MG-UNIT per tablet Take 1 tablet by mouth daily.   Yes Historical Provider, MD  CRESTOR 10 MG tablet Take 10 mg by mouth daily.  09/10/13  Yes Historical Provider, MD  famotidine (PEPCID) 40 MG tablet Take 1 tablet (40 mg total) by mouth daily at 12 noon. 09/01/14  Yes Lafayette Dragon, MD  gabapentin (NEURONTIN) 300 MG capsule Take 300 mg by mouth. 05/12/14 05/12/15 Yes Historical Provider, MD  HYDROcodone-acetaminophen (NORCO/VICODIN) 5-325 MG per tablet Take 1 tablet by mouth every 6 (six) hours as needed for moderate pain. 02/10/14  Yes Thurnell Lose, MD  levothyroxine (SYNTHROID, LEVOTHROID) 88 MCG tablet TAKE ONE TABLET BY MOUTH EVERY MORNING 05/12/14  Yes Historical Provider, MD  MAGNESIUM PO Take 1 tablet by mouth daily.   Yes Historical Provider, MD  metoprolol succinate (TOPROL-XL) 50 MG 24 hr tablet Take 50 mg by mouth daily.  09/10/13  Yes Historical Provider, MD  NEXIUM 40 MG capsule Take 40 mg by mouth 2 (two) times daily before a meal.  10/03/13  Yes Historical Provider, MD  Omega-3 Fatty Acids (FISH OIL PO) Take 1 capsule by mouth daily.   Yes Historical Provider, MD  ondansetron (ZOFRAN) 4 MG tablet Take 1 tablet (4 mg total) by mouth once. Patient taking differently: Take 4 mg by mouth every 8 (eight) hours as needed.  02/10/14  Yes Thurnell Lose, MD  polyethylene glycol (MIRALAX / GLYCOLAX) packet Take 17 g by mouth 2 (two) times daily. Patient taking differently: Take 17 g by mouth daily as needed.  02/10/14  Yes Thurnell Lose, MD  docusate sodium 100 MG CAPS Take 100 mg  by mouth 2 (two) times daily. Patient taking differently: Take 100 mg by mouth 2 (two) times daily as needed.  02/10/14   Thurnell Lose, MD   BP 119/67 mmHg  Pulse 68  Temp(Src) 98.7 F (37.1 C) (Oral)  Resp 13  Ht 5\' 7"  (1.702 m)  Wt 163 lb (73.936 kg)  BMI 25.52 kg/m2  SpO2 100% Physical Exam  Constitutional: She is oriented to person, place, and time. She appears well-developed and well-nourished. No distress.  HENT:  Head: Normocephalic and atraumatic.  Mouth/Throat: No oropharyngeal exudate.  Eyes: Pupils are equal, round, and reactive to light.  Neck: Normal range of motion. Neck supple.  Cardiovascular: Normal rate, regular rhythm and normal heart sounds.  Exam reveals no gallop and no friction rub.   No murmur heard. Pulmonary/Chest: Effort normal and breath sounds normal. No  respiratory distress. She has no wheezes. She has no rales.  Abdominal: Soft. Bowel sounds are normal. She exhibits no distension and no mass. There is no tenderness. There is no rebound and no guarding.  Musculoskeletal: Normal range of motion.       Left lower leg: She exhibits tenderness and edema.       Legs: Neurological: She is alert and oriented to person, place, and time.  Skin: Skin is warm and dry.  Psychiatric: She has a normal mood and affect.    ED Course  Procedures (including critical care time) Labs Review Labs Reviewed  BASIC METABOLIC PANEL - Abnormal; Notable for the following:    Potassium 5.5 (*)    Glucose, Bld 100 (*)    BUN 33 (*)    Creatinine, Ser 1.18 (*)    GFR calc non Af Amer 42 (*)    GFR calc Af Amer 49 (*)    All other components within normal limits  CBC - Abnormal; Notable for the following:    RBC 2.77 (*)    Hemoglobin 8.1 (*)    HCT 25.7 (*)    All other components within normal limits  D-DIMER, QUANTITATIVE - Abnormal; Notable for the following:    D-Dimer, Quant 7.56 (*)    All other components within normal limits  APTT  PROTIME-INR  BASIC  METABOLIC PANEL  CBC  POC OCCULT BLOOD, ED    Imaging Review No results found.   EKG Interpretation   Date/Time:  Thursday October 02 2014 13:37:46 EST Ventricular Rate:  64 PR Interval:  196 QRS Duration: 72 QT Interval:  383 QTC Calculation: 395 R Axis:   61 Text Interpretation:  Age not entered, assumed to be  81 years old for  purpose of ECG interpretation Sinus rhythm Probable anteroseptal infarct,  old Minimal ST elevation, inferior leads No significant change since last  tracing Confirmed by DOCHERTY  MD, MEGAN [phone]) on 10/02/2014 2:35:27 PM      Left Lower Extremity Venous Duplex Completed. Evidence for acute DVT in the common femoral vein, proximal mid and distal superficial femoral vein, popliteal vein, gastroc veins, and posterior tibial veins. There is also evidence for acute SVT in the small saphenous vein.  Baruch Merl Mazza,RVT           MDM   Final diagnoses:  DVT (deep venous thrombosis), left  Anemia, normocytic normochromic  AKI (acute kidney injury)    Pt is a 81 y.o. female with Pmhx as above who presents with DVT diagnosed today at outpt ordered vascular study for 2-3 week of RLE edema/pain. Pt denies CP, SOB. VSS, no acute ishemic changes on EKG. Labs show a recent 2 pt hb drop w/o clear cause as well as continued mild hyperkalemia (seen for same in ED recently with BUN/CR elevation) without hyperacute T waves.  I consulted teaching service to evaluate for appropiateness of outpt vs inpt treatmen and they will pursue admission.       Ernestina Patches, MD 10/02/14 2046

## 2014-10-02 NOTE — Progress Notes (Signed)
Left Lower Extremity Venous Duplex Completed. Evidence for acute DVT in the common femoral vein, proximal mid and distal superficial femoral vein, popliteal vein, gastroc veins, and posterior tibial veins. There is also evidence for acute SVT in the small saphenous vein.  [place]

## 2014-10-03 LAB — CBC
HCT: 30.8 % — ABNORMAL LOW (ref 36.0–46.0)
HEMOGLOBIN: 9.8 g/dL — AB (ref 12.0–15.0)
MCH: 29.9 pg (ref 26.0–34.0)
MCHC: 31.8 g/dL (ref 30.0–36.0)
MCV: 93.9 fL (ref 78.0–100.0)
Platelets: 301 10*3/uL (ref 150–400)
RBC: 3.28 MIL/uL — AB (ref 3.87–5.11)
RDW: 13.9 % (ref 11.5–15.5)
WBC: 7.6 10*3/uL (ref 4.0–10.5)

## 2014-10-03 LAB — BASIC METABOLIC PANEL
ANION GAP: 7 (ref 5–15)
BUN: 26 mg/dL — ABNORMAL HIGH (ref 6–23)
CALCIUM: 9 mg/dL (ref 8.4–10.5)
CO2: 25 mmol/L (ref 19–32)
Chloride: 105 mmol/L (ref 96–112)
Creatinine, Ser: 1.03 mg/dL (ref 0.50–1.10)
GFR calc Af Amer: 57 mL/min — ABNORMAL LOW (ref >90)
GFR calc non Af Amer: 50 mL/min — ABNORMAL LOW (ref >90)
Glucose, Bld: 96 mg/dL (ref 70–99)
POTASSIUM: 4.8 mmol/L (ref 3.5–5.1)
Sodium: 137 mmol/L (ref 135–145)

## 2014-10-03 MED ORDER — APIXABAN 5 MG PO TABS: 10.0000 mg | ORAL_TABLET | Freq: Two times a day (BID) | ORAL | Status: DC

## 2014-10-03 MED ORDER — APIXABAN 5 MG PO TABS: 5.0000 mg | ORAL_TABLET | Freq: Two times a day (BID) | ORAL | Status: DC

## 2014-10-03 MED ORDER — HYDROCODONE-ACETAMINOPHEN 5-325 MG PO TABS: 1.0000 | ORAL_TABLET | Freq: Four times a day (QID) | ORAL | Status: DC | PRN

## 2014-10-03 NOTE — Discharge Summary (Signed)
[facility] Discharge Summary  Patient name: Carolyn [name] Medical record number: [id number] Date of birth: 03/05/1931 Age: 81 y.o. Gender: female Date of Admission: 10/02/2014  Date of Discharge: 10/03/2014 Admitting Physician: Alveda Reasons, MD  Primary Care Provider: Elisabeth Cara, PA-C Consultants: None  Indication for Hospitalization: DVTs with large clot burden and new-onset anemia  Discharge Diagnoses/Problem List:  Patient Active Problem List   Diagnosis Date Noted  . DVT (deep venous thrombosis) 10/02/2014  . Anemia, normocytic normochromic   . AKI (acute kidney injury)   . Barrett's esophagus   . Esophageal reflux   . Hip fracture 02/07/2014  . Unspecified hypothyroidism 02/07/2014  . Essential hypertension, malignant 02/07/2014  . GERD (gastroesophageal reflux disease) 02/07/2014  . Other and unspecified hyperlipidemia 02/07/2014  . CAP (community acquired pneumonia) 11/27/2013  . Hyperthyroidism 11/27/2013  . Weakness 11/27/2013  . Acute encephalopathy 11/27/2013  . Acute thyrotoxicosis 11/27/2013  . Hypothermia 11/26/2013    Disposition: Home  Discharge Condition: Improved  Discharge Exam:  Filed Vitals:   10/03/14 0441  BP: 111/63  Pulse: 85  Temp: 98.7 F (37.1 C)  Resp: 16   General: elderly, alert, well-appearing, NAD, cooperative Lungs: CTAB, normal respiratory effort, no crackles, and no wheezes.  Heart: RRR, no M/R/G.  Abdomen: Bowel sounds normal; abdomen soft and nontender. No masses, organomegaly or hernias noted. Extremities: LLE with erythema, warmth, and swelling to hip. Unable to palpate DP/PT pulses on left. Neurologic: No focal deficits, A&Ox3.  Skin: Intact without suspicious lesions or rashes. Varicose veins noted on RLE. Warm and dry.  Brief Hospital Course:  Carolyn [name] is a 81 y.o. female presenting with left lower leg swelling and pain. PMH hypertension, hypothyroidism, GERD, and  Barrett's esophagus.  Patient presented for left lower extremity venous duplex from orthopedic physician office. She was was found to have acute DVT in the common femoral vein, proximal mid and distal superficial femoral vein, popliteal vein, gastroc veins, and posterior tibial veins. There is also evidence for acute SVT in the small saphenous vein. Patient with lower extremity swelling, erythema, pain, and warmth. She is s/p L hip replacement(Jun 2015). Patient was started on Eliquis for treatment of DVT. She will need to take 10mg  BID for 7 days and then 5mg  BID for at least 6 months. Of note, patient was found to have a new onset acute anemia. We believe this to be secondary to DVTs with large clot burden. She was observed overnight to monitor for any bleeding or worsening anemia. Patient had increased Hbg at time of discharge.   The patient's other chronic conditions were stable during this hospitalization and the patient was continued on home medications.    Issues for Follow Up:  1. Follow-up CBC to trend Hbg 2. Monitor for bleeding on Eliquis 3. Consider work-up for fatigue  Significant Procedures: None  Significant Labs and Imaging:   Recent Labs Lab 09/26/14 1345 10/02/14 1211 10/03/14 0438  WBC 8.1 7.9 7.6  HGB 10.5* 8.1* 9.8*  HCT 32.5* 25.7* 30.8*  PLT 314 368 301    Recent Labs Lab 09/26/14 1345 10/02/14 1211 10/03/14 0438  NA 137 136 137  K 5.4* 5.5* 4.8  CL 104 107 105  CO2 24 24 25   GLUCOSE 97 100* 96  BUN 57* 33* 26*  CREATININE 1.71* 1.18* 1.03  CALCIUM 9.1 9.1 9.0  ALKPHOS 73  --   --   AST 31  --   --  ALT 18  --   --   ALBUMIN 3.1*  --   --    LLE Venous Duplex: Evidence for acute DVT in the common femoral vein, proximal mid and distal superficial femoral vein, popliteal vein, gastroc veins, and posterior tibial veins. There is also evidence for acute SVT in the small saphenous vein.  Results/Tests Pending at Time of Discharge: None  Discharge  Medications:    Medication List    TAKE these medications        ALPRAZolam 1 MG tablet  Commonly known as:  XANAX  Take 1 mg by mouth at bedtime as needed for anxiety.     apixaban 5 MG Tabs tablet  Commonly known as:  ELIQUIS  Take 2 tablets (10 mg total) by mouth 2 (two) times daily.     apixaban 5 MG Tabs tablet  Commonly known as:  ELIQUIS  Take 1 tablet (5 mg total) by mouth 2 (two) times daily.  Start taking on:  10/10/2014     bisacodyl 10 MG suppository  Commonly known as:  DULCOLAX  Place 1 suppository (10 mg total) rectally daily as needed for moderate constipation.     CALCIUM 600 600 MG Tabs tablet  Generic drug:  calcium carbonate  Take 600 mg by mouth.     CALTRATE 600+D 600-400 MG-UNIT per tablet  Generic drug:  Calcium Carbonate-Vitamin D  Take 1 tablet by mouth daily.     CRESTOR 10 MG tablet  Generic drug:  rosuvastatin  Take 10 mg by mouth daily.     DSS 100 MG Caps  Take 100 mg by mouth 2 (two) times daily.     famotidine 40 MG tablet  Commonly known as:  PEPCID  Take 1 tablet (40 mg total) by mouth daily at 12 noon.     FISH OIL PO  Take 1 capsule by mouth daily.     HYDROcodone-acetaminophen 5-325 MG per tablet  Commonly known as:  NORCO/VICODIN  Take 1 tablet by mouth every 6 (six) hours as needed for moderate pain.     HYDROcodone-acetaminophen 5-325 MG per tablet  Commonly known as:  NORCO/VICODIN  Take 1 tablet by mouth every 6 (six) hours as needed for moderate pain.     levothyroxine 88 MCG tablet  Commonly known as:  SYNTHROID, LEVOTHROID  TAKE ONE TABLET BY MOUTH EVERY MORNING     MAGNESIUM PO  Take 1 tablet by mouth daily.     metoprolol succinate 50 MG 24 hr tablet  Commonly known as:  TOPROL-XL  Take 50 mg by mouth daily.     NEURONTIN 300 MG capsule  Generic drug:  gabapentin  Take 300 mg by mouth.     NEXIUM 40 MG capsule  Generic drug:  esomeprazole  Take 40 mg by mouth 2 (two) times daily before a meal.      ondansetron 4 MG tablet  Commonly known as:  ZOFRAN  Take 1 tablet (4 mg total) by mouth once.     polyethylene glycol packet  Commonly known as:  MIRALAX / GLYCOLAX  Take 17 g by mouth 2 (two) times daily.        Discharge Instructions: Please refer to Patient Instructions section of EMR for full details.  Patient was counseled important signs and symptoms that should prompt return to medical care, changes in medications, dietary instructions, activity restrictions, and follow up appointments.   Follow-Up Appointments: Follow-up Information    Follow up with FULBRIGHT, VIRGINIA  E, PA-C. Schedule an appointment as soon as possible for a visit in 1 week.   Specialty:  Family Medicine   Why:  Hospital follow-up   Contact information:   [address] [place] [address] [phone]       Elonna Mcfarlane Y Virgal Warmuth, DO 10/03/2014, 12:01 PM PGY-1, [place]

## 2014-10-03 NOTE — Progress Notes (Signed)
Patient prescription signed by MD. At front desk to be picked up by son. Message left for son to pick up prescription from secretary at front desk.

## 2014-10-03 NOTE — Progress Notes (Signed)
Patient given discharge paperwork. Paperwork reviewed with patient and patient's son. Spoke with MD regarding patient's prescription for pain meds not being signed. MD not about to come up and sign prescriptions at this time.Told by MD they would call prescription in, if not they would fax them to the pharmacy. Informed patient and patient's son of this; son told nurse, "I'll come by and pick up the prescription worse case." MD paged with son's response.

## 2014-10-03 NOTE — Discharge Instructions (Addendum)
Discharge Date: 10/03/2014  Reason for Hospitalization: DVT  You were found to have multiple blood clots in your left leg. In order to treat this you will need to be on a medication called Eliquis for at least 6 months possibly longer. Your PCP will be in charge of treatment duration. Please monitor for any bleeding on this medication.   New medications:  Eliquis for treatment of DVT. (Take 10mg  tabs(2 pills) twice a day until 3/17. Then starting 3/18 only take 5 mg(one pill) twice daily until told to discontinue)   Thank you for letting us participate in your care! Information on my medicine - ELIQUIS (apixaban)  Why was Eliquis prescribed for you? Eliquis was prescribed to treat blood clots that may have been found in the veins of your legs (deep vein thrombosis) or in your lungs (pulmonary embolism) and to reduce the risk of them occurring again.  What do You need to know about Eliquis ? The starting dose is 10 mg (two 5 mg tablets) taken TWICE daily for the FIRST SEVEN (7) DAYS, then on Saturday  10/11/14  the dose is reduced to ONE 5 mg tablet taken TWICE daily.  Eliquis may be taken with or without food.   Try to take the dose about the same time in the morning and in the evening. If you have difficulty swallowing the tablet whole please discuss with your pharmacist how to take the medication safely.  Take Eliquis exactly as prescribed and DO NOT stop taking Eliquis without talking to the doctor who prescribed the medication.  Stopping may increase your risk of developing a new blood clot.  Refill your prescription before you run out.  After discharge, you should have regular check-up appointments with your healthcare provider that is prescribing your Eliquis.    What do you do if you miss a dose? If a dose of ELIQUIS is not taken at the scheduled time, take it as soon as possible on the same day and twice-daily administration should be resumed. The dose should not be doubled  to make up for a missed dose.  Important Safety Information A possible side effect of Eliquis is bleeding. You should call your healthcare provider right away if you experience any of the following: ? Bleeding from an injury or your nose that does not stop. ? Unusual colored urine (red or dark brown) or unusual colored stools (red or black). ? Unusual bruising for unknown reasons. ? A serious fall or if you hit your head (even if there is no bleeding).  Some medicines may interact with Eliquis and might increase your risk of bleeding or clotting while on Eliquis. To help avoid this, consult your healthcare provider or pharmacist prior to using any new prescription or non-prescription medications, including herbals, vitamins, non-steroidal anti-inflammatory drugs (NSAIDs) and supplements.  This website has more information on Eliquis (apixaban): http://www.eliquis.com/eliquis/home

## 2014-10-07 ENCOUNTER — Telehealth (HOSPITAL_COMMUNITY): Payer: Self-pay | Admitting: *Deleted

## 2015-01-13 ENCOUNTER — Encounter (HOSPITAL_COMMUNITY): Payer: Self-pay | Admitting: Nurse Practitioner

## 2015-01-13 ENCOUNTER — Emergency Department (HOSPITAL_COMMUNITY): Payer: Medicare Other

## 2015-01-13 ENCOUNTER — Inpatient Hospital Stay (HOSPITAL_COMMUNITY)
Admission: EM | Admit: 2015-01-13 | Discharge: 2015-01-17 | DRG: 378 | Disposition: A | Discharge status: Home / Self Care | Payer: Medicare Other | Attending: Family Medicine | Admitting: Family Medicine

## 2015-01-13 DIAGNOSIS — K5730 Diverticulosis of large intestine without perforation or abscess without bleeding: Secondary | ICD-10-CM | POA: Diagnosis present

## 2015-01-13 DIAGNOSIS — K449 Diaphragmatic hernia without obstruction or gangrene: Secondary | ICD-10-CM | POA: Diagnosis present

## 2015-01-13 DIAGNOSIS — N179 Acute kidney failure, unspecified: Secondary | ICD-10-CM | POA: Diagnosis present

## 2015-01-13 DIAGNOSIS — F329 Major depressive disorder, single episode, unspecified: Secondary | ICD-10-CM | POA: Diagnosis present

## 2015-01-13 DIAGNOSIS — Z86718 Personal history of other venous thrombosis and embolism: Secondary | ICD-10-CM | POA: Diagnosis not present

## 2015-01-13 DIAGNOSIS — R195 Other fecal abnormalities: Secondary | ICD-10-CM | POA: Diagnosis not present

## 2015-01-13 DIAGNOSIS — K210 Gastro-esophageal reflux disease with esophagitis: Secondary | ICD-10-CM | POA: Diagnosis present

## 2015-01-13 DIAGNOSIS — K922 Gastrointestinal hemorrhage, unspecified: Secondary | ICD-10-CM | POA: Diagnosis not present

## 2015-01-13 DIAGNOSIS — Z79899 Other long term (current) drug therapy: Secondary | ICD-10-CM | POA: Diagnosis not present

## 2015-01-13 DIAGNOSIS — K3184 Gastroparesis: Secondary | ICD-10-CM | POA: Diagnosis present

## 2015-01-13 DIAGNOSIS — E785 Hyperlipidemia, unspecified: Secondary | ICD-10-CM | POA: Diagnosis present

## 2015-01-13 DIAGNOSIS — K31811 Angiodysplasia of stomach and duodenum with bleeding: Secondary | ICD-10-CM | POA: Diagnosis present

## 2015-01-13 DIAGNOSIS — Z880 Allergy status to penicillin: Secondary | ICD-10-CM

## 2015-01-13 DIAGNOSIS — D649 Anemia, unspecified: Secondary | ICD-10-CM | POA: Diagnosis not present

## 2015-01-13 DIAGNOSIS — Z79891 Long term (current) use of opiate analgesic: Secondary | ICD-10-CM | POA: Diagnosis not present

## 2015-01-13 DIAGNOSIS — Z86010 Personal history of colonic polyps: Secondary | ICD-10-CM

## 2015-01-13 DIAGNOSIS — Z7901 Long term (current) use of anticoagulants: Secondary | ICD-10-CM | POA: Diagnosis not present

## 2015-01-13 DIAGNOSIS — K921 Melena: Secondary | ICD-10-CM | POA: Diagnosis not present

## 2015-01-13 DIAGNOSIS — K31819 Angiodysplasia of stomach and duodenum without bleeding: Secondary | ICD-10-CM | POA: Diagnosis not present

## 2015-01-13 DIAGNOSIS — E039 Hypothyroidism, unspecified: Secondary | ICD-10-CM | POA: Diagnosis present

## 2015-01-13 DIAGNOSIS — D62 Acute posthemorrhagic anemia: Secondary | ICD-10-CM | POA: Diagnosis present

## 2015-01-13 DIAGNOSIS — E780 Pure hypercholesterolemia: Secondary | ICD-10-CM | POA: Diagnosis present

## 2015-01-13 DIAGNOSIS — Z87891 Personal history of nicotine dependence: Secondary | ICD-10-CM

## 2015-01-13 DIAGNOSIS — J449 Chronic obstructive pulmonary disease, unspecified: Secondary | ICD-10-CM | POA: Diagnosis present

## 2015-01-13 DIAGNOSIS — D125 Benign neoplasm of sigmoid colon: Secondary | ICD-10-CM | POA: Diagnosis present

## 2015-01-13 DIAGNOSIS — F419 Anxiety disorder, unspecified: Secondary | ICD-10-CM | POA: Diagnosis present

## 2015-01-13 DIAGNOSIS — Z96641 Presence of right artificial hip joint: Secondary | ICD-10-CM | POA: Diagnosis not present

## 2015-01-13 DIAGNOSIS — W010XXD Fall on same level from slipping, tripping and stumbling without subsequent striking against object, subsequent encounter: Secondary | ICD-10-CM | POA: Diagnosis not present

## 2015-01-13 DIAGNOSIS — I700 Atherosclerosis of aorta: Secondary | ICD-10-CM | POA: Diagnosis present

## 2015-01-13 DIAGNOSIS — Z96642 Presence of left artificial hip joint: Secondary | ICD-10-CM | POA: Diagnosis present

## 2015-01-13 DIAGNOSIS — W19XXXD Unspecified fall, subsequent encounter: Secondary | ICD-10-CM | POA: Diagnosis not present

## 2015-01-13 DIAGNOSIS — I10 Essential (primary) hypertension: Secondary | ICD-10-CM | POA: Diagnosis not present

## 2015-01-13 DIAGNOSIS — S72001D Fracture of unspecified part of neck of right femur, subsequent encounter for closed fracture with routine healing: Secondary | ICD-10-CM | POA: Diagnosis not present

## 2015-01-13 HISTORY — DX: Personal history of colonic polyps: Z86.010

## 2015-01-13 HISTORY — DX: Barrett's esophagus without dysplasia: K22.70

## 2015-01-13 HISTORY — DX: Diverticulosis of large intestine without perforation or abscess without bleeding: K57.30

## 2015-01-13 HISTORY — DX: Chronic kidney disease, unspecified: N18.9

## 2015-01-13 HISTORY — DX: Personal history of adenomatous and serrated colon polyps: Z86.0101

## 2015-01-13 HISTORY — DX: Angiodysplasia of stomach and duodenum without bleeding: K31.819

## 2015-01-13 HISTORY — DX: Diaphragmatic hernia without obstruction or gangrene: K44.9

## 2015-01-13 HISTORY — DX: Other specified anxiety disorders: F41.8

## 2015-01-13 LAB — BASIC METABOLIC PANEL
Anion gap: 5 (ref 5–15)
BUN: 22 mg/dL — ABNORMAL HIGH (ref 6–20)
CALCIUM: 9.1 mg/dL (ref 8.9–10.3)
CO2: 22 mmol/L (ref 22–32)
Chloride: 109 mmol/L (ref 101–111)
Creatinine, Ser: 1.09 mg/dL — ABNORMAL HIGH (ref 0.44–1.00)
GFR calc Af Amer: 54 mL/min — ABNORMAL LOW (ref >60)
GFR calc non Af Amer: 46 mL/min — ABNORMAL LOW (ref >60)
Glucose, Bld: 109 mg/dL — ABNORMAL HIGH (ref 65–99)
Potassium: 4.5 mmol/L (ref 3.5–5.1)
SODIUM: 136 mmol/L (ref 135–145)

## 2015-01-13 LAB — PROTIME-INR
INR: 1.26 (ref 0.00–1.49)
Prothrombin Time: 16 seconds — ABNORMAL HIGH (ref 11.6–15.2)

## 2015-01-13 LAB — PREPARE RBC (CROSSMATCH)

## 2015-01-13 LAB — CBC
HCT: 23.1 % — ABNORMAL LOW (ref 36.0–46.0)
HEMOGLOBIN: 7 g/dL — AB (ref 12.0–15.0)
MCH: 26.7 pg (ref 26.0–34.0)
MCHC: 30.3 g/dL (ref 30.0–36.0)
MCV: 88.2 fL (ref 78.0–100.0)
Platelets: 286 10*3/uL (ref 150–400)
RBC: 2.62 MIL/uL — ABNORMAL LOW (ref 3.87–5.11)
RDW: 15.6 % — ABNORMAL HIGH (ref 11.5–15.5)
WBC: 5.2 10*3/uL (ref 4.0–10.5)

## 2015-01-13 LAB — POC OCCULT BLOOD, ED: FECAL OCCULT BLD: POSITIVE — AB

## 2015-01-13 LAB — MRSA PCR SCREENING: MRSA by PCR: NEGATIVE

## 2015-01-13 MED ORDER — ACETAMINOPHEN 650 MG RE SUPP: 650.0000 mg | Freq: Four times a day (QID) | RECTAL | Status: DC | PRN

## 2015-01-13 MED ORDER — SODIUM CHLORIDE 0.9 % IV SOLN: Freq: Once | INTRAVENOUS | Status: DC

## 2015-01-13 MED ORDER — ACETAMINOPHEN 325 MG PO TABS
650.0000 mg | ORAL_TABLET | Freq: Four times a day (QID) | ORAL | Status: DC | PRN
Start: 2015-01-13 — End: 2015-01-17

## 2015-01-13 MED ORDER — ROSUVASTATIN CALCIUM 10 MG PO TABS
10.0000 mg | ORAL_TABLET | Freq: Every day | ORAL | Status: DC
  Administered 2015-01-14 – 2015-01-17 (×4): 10 mg via ORAL
  Filled 2015-01-13 (×4): qty 1

## 2015-01-13 MED ORDER — LEVOTHYROXINE SODIUM 88 MCG PO TABS
88.0000 ug | ORAL_TABLET | Freq: Every day | ORAL | Status: DC
Start: 2015-01-14 — End: 2015-01-17
  Administered 2015-01-14 – 2015-01-17 (×4): 88 ug via ORAL
  Filled 2015-01-13 (×6): qty 1

## 2015-01-13 MED ORDER — PANTOPRAZOLE SODIUM 40 MG IV SOLR
40.0000 mg | Freq: Two times a day (BID) | INTRAVENOUS | Status: DC
  Administered 2015-01-13 – 2015-01-14 (×2): 40 mg via INTRAVENOUS
  Filled 2015-01-13 (×3): qty 40

## 2015-01-13 MED ORDER — GABAPENTIN 300 MG PO CAPS
600.0000 mg | ORAL_CAPSULE | Freq: Every day | ORAL | Status: DC
  Administered 2015-01-13 – 2015-01-16 (×4): 600 mg via ORAL
  Filled 2015-01-13 (×5): qty 2

## 2015-01-13 MED ORDER — METOPROLOL SUCCINATE ER 50 MG PO TB24
50.0000 mg | ORAL_TABLET | Freq: Every day | ORAL | Status: DC
  Administered 2015-01-14 – 2015-01-17 (×4): 50 mg via ORAL
  Filled 2015-01-13 (×4): qty 1

## 2015-01-13 MED ORDER — SODIUM CHLORIDE 0.9 % IV SOLN
INTRAVENOUS | Status: DC
  Administered 2015-01-13 – 2015-01-14 (×2): via INTRAVENOUS

## 2015-01-13 MED ORDER — SODIUM CHLORIDE 0.9 % IV BOLUS (SEPSIS)
1000.0000 mL | Freq: Once | INTRAVENOUS | Status: AC
  Administered 2015-01-13: 1000 mL via INTRAVENOUS

## 2015-01-13 MED ORDER — SODIUM CHLORIDE 0.9 % IJ SOLN
3.0000 mL | Freq: Two times a day (BID) | INTRAMUSCULAR | Status: DC
  Administered 2015-01-14 – 2015-01-17 (×6): 3 mL via INTRAVENOUS

## 2015-01-13 MED ORDER — ALPRAZOLAM 0.5 MG PO TABS
1.0000 mg | ORAL_TABLET | Freq: Every evening | ORAL | Status: DC | PRN
  Administered 2015-01-14 – 2015-01-15 (×2): 1 mg via ORAL
  Filled 2015-01-13 (×2): qty 2

## 2015-01-13 MED ORDER — POLYETHYLENE GLYCOL 3350 17 G PO PACK
17.0000 g | PACK | Freq: Every day | ORAL | Status: DC | PRN
  Filled 2015-01-13: qty 1

## 2015-01-13 NOTE — Progress Notes (Signed)
Patient arrived to 2C11. Blood transfusing. Patient denies any pain or discomfort at this time.

## 2015-01-13 NOTE — ED Notes (Signed)
Signed blood consent at bedside  

## 2015-01-13 NOTE — H&P (Signed)
Carolyn [name] Admission History and Physical Service Pager: [phone]  Patient name: Carolyn [name] Medical record number: [id number] Date of birth: 1931-02-08 Age: 81 y.o. Gender: female  Primary Care Provider: Elisabeth Cara, PA-C Consultants: GI Smitty Pluck) Code Status: Full (confirmed on admission)  Chief Complaint: DOE, Generalized Weakness  Assessment and Plan: [name] is a 81 y.o. female presenting with gradual worsening DOE and generalized weakness x 2-3 weeks, with significant worsening in past 1 week. PMH is significant for H/o L-LE DVT (09/2014) currently on anticoagulation therapy (Eliquis), mod-severe diverticulosis, GERD with esophagitis (dx Barrett's), h/o gastritis, s/p Left-hip arthroplasty for frx, HTN, HLD, Hypothyroidism, Anxiety, Depression, former smoker.  # Symptomatic Anemia, suspected subacute on chronic Blood Loss Anemia, with suspected GIB Chronic h/o anemia Hgb b/l 10-11, recent acute drop in Hgb 09/2014 with DVT since improved, now again with acute vs subacute dec Hgb. MCV 88.2, normocytic, inc RDW, no recent iron studies or ferritin. Suspect worsening anemia is etiology of symptoms, presumed due to GIB unclear source,FOBT (+ in ED), without prior h/o GIB. Known diverticulosis on colonoscopy (2007) but clinically less likely without BRBPR, h/o gastritis w/o PUD, however on eliquis anticoag may have AVM or occult GIB. Cannot rule out colon CA. No known CAD, without CP. On admit, currently vitals stable, no tachycardia and BP stable. - Admit to inpatient, precautionary SDU status o/n for presumed GIB - S/p 2u PRBC transfusion - Repeat Hgb in AM, sooner if clinical change - Hold Eliquis today 6/21 - Check ferritin (consider checking additional anemia panel in future as indicated, however s/p transfusion) - Cycle Troponin-I x 3, EKG in AM - IVF overnight while NPO - Protonix IV 40mg  q 12 hr - Consulted [facility], discussed case  with Dr. Henrene Pastor on admit. Anticipate to see patient in AM. Agree with NPO o/n, transfusion, holding eliquis in anticipation of possible EGD / colonoscopy tomorrow.  # GERD with esophagitis, gastritis - Followed by [facility] (Dr. Olevia Perches), last EGD 08/2014 for h/o GERD s/p Nissen fundoplication with results of esophagitis (bx Barrett's), gastritis, gastroparesis, with negative bx for malignancy - PPI as above - F/u GI recs, consider endoscopy  # Recent L-LE DVT, on anticoagulation therapy - History of 1st DVT 09/2014, following < 8 months after L-hip arthroplasty (01/2014), presumed provoked. Previously plan to be on eliquis x 6 months. Concerns with fall risk. - Hold eliquis today 6/21, if stable anticipate resuming tomorrow  # HTN BP stable - Hold Metoprolol-XL on admit, will resume tomorrow if Hgb stable no active bleeding  # HLD - Continue home Crestor 10mg  daily  # Hypothyroidism - Continue home Levothyroxine 52mcg daily  # OA, bilateral knees, h/o hip fracture - Currently stable without acute arthritis flare or pain - Hold home Vicodin - Continue home Gabapentin 600mg  qhs - Tylenol PRN  # Anxiety, Depression - Continue home Xanax 0.5mg  qhs (does take most nights for sleep/anxiety) - No longer on celexa  FEN/GI: NS @ 100cc/hr (after 2u PRBC) x 12 hr while NPO (sips, chips, meds) pending possible endoscopy Prophylaxis: SCDs, holding therapeutic anticoagulation (Eliquis)  Disposition: Admit to inpatient, SDU for monitoring in symptomatic anemia in suspected GIB, 2u PRBC transfusion, GI consulted, may need EGD / colonoscopy in AM.  History of Present Illness:  History provided by patient and also discussed with her daughter, Francis Dowse via telephone.  Carolyn [name] is a 81 y.o. female presenting with recent symptoms of generalized weakness with activity over  past 1-2 weeks, described as feeling "tired or exhausted" with significant worsening over past 1 week. Symptoms on  minimal exertion or walking, < 100 ft. Admits to some dyspnea on exertion but mostly described as "tired" and less "difficulty breathing". Denies any CP/tightness, recent fall, LOC, lightheadedness/dizziness. Additionally associated with recent decreased appetite, reduced PO intake and hydration over past few days to 1 week. She presented to her PCP office today for follow-up on these symptoms, checked Hgb down to 7.3, advised to go to [facility] for evaluation, possible bleeding. - Denied any acute active bleeding (epistaxis, hematuria, skin/hematoma) or trauma, melena, hematochezia/BRBPR, abdominal pain, diarrhea / constipation  Additionally, pt chronically followed by [facility] for her GERD with esophagitis, gastritis, s/p Nissen procedure, last EGD 08/2014 confirms Barrett's esophagus, biopsies without malignancy, no active bleeding or PUD. Last Colonoscopy 04/2006, mod-severe diverticulosis, L-colon, return in 10 years. Denies family h/o colon CA.  Significant related history pt recently started on anticoagulation with Eliquis x 3 months ago (09/2014) for LLE DVT following hip fracture 01/2014, s/p Left Hip arthropplasty. Plan to treat for 6 months, had been tolerating without problems. Compliant without missed doses.  In ED, patient had Hgb re-checked at 7.0, no acute bleeding, FOBT positive in ED without gross blood. 2u PRBC transfusion started. 1L IVF bolus given. Vitals stable in ED.  Lives at home alone, very functional, ambulates with cane, performs all ADLs, has family nearby in [place] (son, Villa Herb, checks on her daily) and daughter Francis Dowse) within 1 hour frequently serves as caregiver.  Review Of Systems: Per HPI with the following additions: none Otherwise 12 point review of systems was performed and was unremarkable.  Patient Active Problem List   Diagnosis Date Noted  . GI bleed 01/13/2015  . DVT (deep venous thrombosis) 10/02/2014  . Anemia, normocytic normochromic   .  AKI (acute kidney injury)   . Barrett's esophagus   . Esophageal reflux   . Hip fracture 02/07/2014  . Unspecified hypothyroidism 02/07/2014  . Essential hypertension, malignant 02/07/2014  . GERD (gastroesophageal reflux disease) 02/07/2014  . Other and unspecified hyperlipidemia 02/07/2014  . CAP (community acquired pneumonia) 11/27/2013  . Hyperthyroidism 11/27/2013  . Weakness 11/27/2013  . Acute encephalopathy 11/27/2013  . Acute thyrotoxicosis 11/27/2013  . Hypothermia 11/26/2013   Past Medical History: Past Medical History  Diagnosis Date  . Hypertension   . High cholesterol   . Hypothyroidism   . Asthma     "only when I smoked"  . H/O hiatal hernia     "twice"  . GERD (gastroesophageal reflux disease)   . Arthritis     "both knees" (11/26/2013)  . Depression     "little bit" (11/26/2013)  . Anxiety     "little bit" (11/26/2013)  . COPD (chronic obstructive pulmonary disease)   . Pneumonia 11/2013   Past Surgical History: Past Surgical History  Procedure Laterality Date  . Hernia repair    . Cholecystectomy    . Bladder suspension      "w/hysterectomy"  . Hiatal hernia repair  X 2  . Abdominal hysterectomy    . Cataract extraction w/ intraocular lens  implant, bilateral Bilateral   . Appendectomy    . Hip arthroplasty Left 02/07/2014    Procedure: LEFT HIP PRESS FIT MONOPOLAR HEMIARTHROPLASTY ;  Surgeon: Marybelle Killings, MD;  Location: [facility];  Service: Orthopedics;  Laterality: Left;  . Esophagogastroduodenoscopy N/A 09/01/2014    Procedure: ESOPHAGOGASTRODUODENOSCOPY (EGD);  Surgeon: Lafayette Dragon, MD;  Location: [facility];  Service: Endoscopy;  Laterality: N/A;   Social History: History  Substance Use Topics  . Smoking status: Former Smoker -- 0.25 packs/day for 40 years    Types: Cigarettes  . Smokeless tobacco: Never Used     Comment: "quit smoking ~ 2005"  . Alcohol Use: No     Comment: 11/26/2013 "drank a little alcohol; last drink was a long time ago"    Additional social history: chronic smoking history as above (quit). No alcohol or other drugs.  Please also refer to relevant sections of EMR.  Family History: Family History  Problem Relation Age of Onset  . Stomach cancer Mother   . Heart disease Father   . Heart disease Brother   . Bladder Cancer Sister    Allergies and Medications: Allergies  Allergen Reactions  . Penicillins Hives   No current facility-administered medications on file prior to encounter.   Current Outpatient Prescriptions on File Prior to Encounter  Medication Sig Dispense Refill  . ALPRAZolam (XANAX) 1 MG tablet Take 1 mg by mouth at bedtime.     Marland Kitchen apixaban (ELIQUIS) 5 MG TABS tablet Take 1 tablet (5 mg total) by mouth 2 (two) times daily. 60 tablet 0  . bisacodyl (DULCOLAX) 10 MG suppository Place 1 suppository (10 mg total) rectally daily as needed for moderate constipation. 20 suppository 0  . Calcium Carbonate-Vitamin D (CALTRATE 600+D) 600-400 MG-UNIT per tablet Take 1 tablet by mouth daily.    Marland Kitchen docusate sodium 100 MG CAPS Take 100 mg by mouth 2 (two) times daily. (Patient taking differently: Take 100 mg by mouth 2 (two) times daily as needed. ) 25 capsule 0  . famotidine (PEPCID) 40 MG tablet Take 1 tablet (40 mg total) by mouth daily at 12 noon. 90 tablet 3  . gabapentin (NEURONTIN) 300 MG capsule Take 600 mg by mouth at bedtime.     Marland Kitchen levothyroxine (SYNTHROID, LEVOTHROID) 88 MCG tablet TAKE ONE TABLET BY MOUTH EVERY MORNING    . MAGNESIUM PO Take 1 tablet by mouth 2 (two) times daily.     . metoprolol succinate (TOPROL-XL) 50 MG 24 hr tablet Take 50 mg by mouth daily.     Marland Kitchen NEXIUM 40 MG capsule Take 40 mg by mouth 2 (two) times daily before a meal.     . Omega-3 Fatty Acids (FISH OIL PO) Take 1 capsule by mouth 2 (two) times daily.     . polyethylene glycol (MIRALAX / GLYCOLAX) packet Take 17 g by mouth 2 (two) times daily. (Patient taking differently: Take 17 g by mouth daily as needed. ) 14 each 0   . apixaban (ELIQUIS) 5 MG TABS tablet Take 2 tablets (10 mg total) by mouth 2 (two) times daily. 13 tablet 0  . HYDROcodone-acetaminophen (NORCO/VICODIN) 5-325 MG per tablet Take 1 tablet by mouth every 6 (six) hours as needed for moderate pain. 20 tablet 0  . HYDROcodone-acetaminophen (NORCO/VICODIN) 5-325 MG per tablet Take 1 tablet by mouth every 6 (six) hours as needed for moderate pain. (Patient not taking: Reported on 01/13/2015) 20 tablet 0  . ondansetron (ZOFRAN) 4 MG tablet Take 1 tablet (4 mg total) by mouth once. (Patient taking differently: Take 4 mg by mouth every 8 (eight) hours as needed for nausea or vomiting. ) 30 tablet 0    Objective: BP 146/70 mmHg  Pulse 70  Temp(Src) 97.5 F (36.4 C) (Oral)  Resp 14  Ht 5\' 9"  (1.753 m)  Wt 159 lb (  72.122 kg)  BMI 23.47 kg/m2  SpO2 98% Exam: Gen - elderly 60 yr F, sitting up in bed, comfortable and pleasant, NAD HEENT - NCAT without bruising, PERRL, EOMI, pale conjunctiva, patent nares w/o congestion, oropharynx clear, dry MM Neck - supple, non-tender, no LAD, no thyromegaly Heart - RRR, no murmurs heard Lungs - CTAB, no wheezing, crackles, or rhonchi. Normal work of breathing. Speaks full sentences. Abd - soft, NTND, no masses, +active BS Ext - non-tender, no edema, peripheral pulses intact +2 b/l. Mildly delayed cap refill at 3 sec Skin - pale appearance, warm, dry, no rashes Neuro - awake, alert, oriented (person, year, place), grossly non-focal, intact muscle strength 5/5 b/l grip, ankle dorsiflexion, intact distal sensation to light touch, gait not tested  Labs and Imaging: CBC BMET   Recent Labs Lab 01/13/15 1322  WBC 5.2  HGB 7.0*  HCT 23.1*  PLT 286    Recent Labs Lab 01/13/15 1322  NA 136  K 4.5  CL 109  CO2 22  BUN 22*  CREATININE 1.09*  GLUCOSE 109*  CALCIUM 9.1     FOBT - POSITIVE (ED, 6/21)  PT - 16 INR - 1.26  6/21 CXR 2v IMPRESSION: 1. Chronic changes of COPD. 2. Hiatal hernia 3. Aortic  atherosclerosis.  Olin Hauser, DO 01/13/2015, 7:41 PM PGY-2, [facility] pager: [phone], text pages welcome

## 2015-01-13 NOTE — ED Notes (Signed)
Son took her to PCP today because she "has not been eating well or feeling well." They did lab work and found her hemoglobin and hematocrit low and sent her to ED for further evaluation. Pt denies any pain, she is a&ox4

## 2015-01-13 NOTE — ED Provider Notes (Signed)
CSN: [id number]     Arrival date & time 01/13/15  1235 History   First MD Initiated Contact with Patient 01/13/15 1502     Chief Complaint  Patient presents with  . Anemia     (Consider location/radiation/quality/duration/timing/severity/associated sxs/prior Treatment) HPI Carolyn [name] is a 81 y.o. female with a history of GERD, hypertension, on apixaban for previous hip fracture, comes in for evaluation of low hemoglobin and generalized weakness. She is accompanied by her son who contributes to history of present illness. Patient states for the past week she has felt increasingly tired and weak. She states when she sits down she feels okay, but when she gets up and tries to walk even a few steps she gets very tired and weak. Son reports she looks much more pale than usual. She denies any chest pain, shortness of breath, headache, abdominal pain, nausea or vomiting. She denies any bloody, dark stools. Denies any other medical problems at this time. She does report that she was seen at her primary care doctor this morning and was referred to the ED for further evaluation of her low hemoglobin.  Past Medical History  Diagnosis Date  . Hypertension   . High cholesterol   . Hypothyroidism   . Asthma     "only when I smoked"  . H/O hiatal hernia     "twice"  . GERD (gastroesophageal reflux disease)   . Arthritis     "both knees" (11/26/2013)  . Depression     "little bit" (11/26/2013)  . Anxiety     "little bit" (11/26/2013)  . COPD (chronic obstructive pulmonary disease)   . Pneumonia 11/2013   Past Surgical History  Procedure Laterality Date  . Hernia repair    . Cholecystectomy    . Bladder suspension      "w/hysterectomy"  . Hiatal hernia repair  X 2  . Abdominal hysterectomy    . Cataract extraction w/ intraocular lens  implant, bilateral Bilateral   . Appendectomy    . Hip arthroplasty Left 02/07/2014    Procedure: LEFT HIP PRESS FIT MONOPOLAR HEMIARTHROPLASTY ;  Surgeon: Marybelle Killings, MD;  Location: [place];  Service: Orthopedics;  Laterality: Left;  . Esophagogastroduodenoscopy N/A 09/01/2014    Procedure: ESOPHAGOGASTRODUODENOSCOPY (EGD);  Surgeon: Lafayette Dragon, MD;  Location: [facility];  Service: Endoscopy;  Laterality: N/A;   Family History  Problem Relation Age of Onset  . Stomach cancer Mother   . Heart disease Father   . Heart disease Brother   . Bladder Cancer Sister    History  Substance Use Topics  . Smoking status: Former Smoker -- 0.25 packs/day for 40 years    Types: Cigarettes  . Smokeless tobacco: Never Used     Comment: "quit smoking ~ 2005"  . Alcohol Use: No     Comment: 11/26/2013 "drank a little alcohol; last drink was a long time ago"   OB History    No data available     Review of Systems A 10 point review of systems was completed and was negative except for pertinent positives and negatives as mentioned in the history of present illness     Allergies  Penicillins  Home Medications   Prior to Admission medications   Medication Sig Start Date End Date Taking? Authorizing Provider  ALPRAZolam Duanne Moron) 1 MG tablet Take 1 mg by mouth at bedtime as needed for anxiety.  11/01/13   Historical Provider, MD  apixaban (ELIQUIS) 5 MG TABS  tablet Take 2 tablets (10 mg total) by mouth 2 (two) times daily. 10/03/14 10/09/14  Katheren Shams, DO  apixaban (ELIQUIS) 5 MG TABS tablet Take 1 tablet (5 mg total) by mouth 2 (two) times daily. 10/10/14   Katheren Shams, DO  bisacodyl (DULCOLAX) 10 MG suppository Place 1 suppository (10 mg total) rectally daily as needed for moderate constipation. 02/10/14   Thurnell Lose, MD  calcium carbonate (CALCIUM 600) 600 MG TABS tablet Take 600 mg by mouth.    Historical Provider, MD  Calcium Carbonate-Vitamin D (CALTRATE 600+D) 600-400 MG-UNIT per tablet Take 1 tablet by mouth daily.    Historical Provider, MD  CRESTOR 10 MG tablet Take 10 mg by mouth daily.  09/10/13   Historical Provider, MD  docusate sodium  100 MG CAPS Take 100 mg by mouth 2 (two) times daily. Patient taking differently: Take 100 mg by mouth 2 (two) times daily as needed.  02/10/14   Thurnell Lose, MD  famotidine (PEPCID) 40 MG tablet Take 1 tablet (40 mg total) by mouth daily at 12 noon. 09/01/14   Lafayette Dragon, MD  gabapentin (NEURONTIN) 300 MG capsule Take 300 mg by mouth. 05/12/14 05/12/15  Historical Provider, MD  HYDROcodone-acetaminophen (NORCO/VICODIN) 5-325 MG per tablet Take 1 tablet by mouth every 6 (six) hours as needed for moderate pain. 02/10/14   Thurnell Lose, MD  HYDROcodone-acetaminophen (NORCO/VICODIN) 5-325 MG per tablet Take 1 tablet by mouth every 6 (six) hours as needed for moderate pain. 10/03/14   Katheren Shams, DO  levothyroxine (SYNTHROID, LEVOTHROID) 88 MCG tablet TAKE ONE TABLET BY MOUTH EVERY MORNING 05/12/14   Historical Provider, MD  MAGNESIUM PO Take 1 tablet by mouth daily.    Historical Provider, MD  metoprolol succinate (TOPROL-XL) 50 MG 24 hr tablet Take 50 mg by mouth daily.  09/10/13   Historical Provider, MD  NEXIUM 40 MG capsule Take 40 mg by mouth 2 (two) times daily before a meal.  10/03/13   Historical Provider, MD  Omega-3 Fatty Acids (FISH OIL PO) Take 1 capsule by mouth daily.    Historical Provider, MD  ondansetron (ZOFRAN) 4 MG tablet Take 1 tablet (4 mg total) by mouth once. Patient taking differently: Take 4 mg by mouth every 8 (eight) hours as needed.  02/10/14   Thurnell Lose, MD  polyethylene glycol (MIRALAX / GLYCOLAX) packet Take 17 g by mouth 2 (two) times daily. Patient taking differently: Take 17 g by mouth daily as needed.  02/10/14   Thurnell Lose, MD   BP 150/83 mmHg  Pulse 73  Temp(Src) 98.2 F (36.8 C) (Oral)  Resp 16  Ht 5\' 9"  (1.753 m)  Wt 159 lb (72.122 kg)  BMI 23.47 kg/m2  SpO2 95% Physical Exam  Constitutional: She is oriented to person, place, and time. She appears well-developed and well-nourished.  HENT:  Head: Normocephalic and atraumatic.   Mouth/Throat: Oropharynx is clear and moist.  Eyes: Pupils are equal, round, and reactive to light. Right eye exhibits no discharge. Left eye exhibits no discharge. No scleral icterus.  Pale conjunctiva.  Neck: Neck supple.  Cardiovascular: Normal rate, regular rhythm and normal heart sounds.   Pulmonary/Chest: Effort normal and breath sounds normal. No respiratory distress. She has no wheezes. She has no rales.  Abdominal: Soft. There is no tenderness.  Genitourinary:  Female chaperone present for rectal exam. No frank blood on exam. Small amount of brown stool on exam glove. External hemorrhoids,  nonthrombosed.  Musculoskeletal: Normal range of motion. She exhibits no edema or tenderness.  Neurological: She is alert and oriented to person, place, and time.  Cranial Nerves II-XII grossly intact  Skin: Skin is warm and dry. No rash noted. There is pallor.  Psychiatric: She has a normal mood and affect.  Nursing note and vitals reviewed.   ED Course  Procedures (including critical care time) Labs Review Labs Reviewed  CBC - Abnormal; Notable for the following:    RBC 2.62 (*)    Hemoglobin 7.0 (*)    HCT 23.1 (*)    RDW 15.6 (*)    All other components within normal limits  BASIC METABOLIC PANEL - Abnormal; Notable for the following:    Glucose, Bld 109 (*)    BUN 22 (*)    Creatinine, Ser 1.09 (*)    GFR calc non Af Amer 46 (*)    GFR calc Af Amer 54 (*)    All other components within normal limits  OCCULT BLOOD X 1 CARD TO LAB, STOOL  URINALYSIS, ROUTINE W REFLEX MICROSCOPIC (NOT AT [facility])  PROTIME-INR  I-STAT CG4 LACTIC ACID, ED  POC OCCULT BLOOD, ED  TYPE AND SCREEN    Imaging Review Dg Chest 2 View  01/13/2015   CLINICAL DATA:  Not eating well or feeling well. Low hemoglobin and hematocrit  EXAM: CHEST  2 VIEW  COMPARISON:  02/07/2014.  FINDINGS: The heart size appears normal. Moderate hiatal hernia noted. There is no pleural effusion or edema. The lungs are  hyperinflated and there are coarsened interstitial markings bilaterally. Aortic atherosclerosis noted.  IMPRESSION: 1. Chronic changes of COPD. 2. Hiatal hernia 3. Aortic atherosclerosis.   Electronically Signed   By: Kerby Moors M.D.   On: 01/13/2015 15:24     EKG Interpretation None     CRITICAL CARE Performed by: Verl Dicker   Total critical care time: 35  Critical care time was exclusive of separately billable procedures and treating other patients.  Critical care was necessary to treat or prevent imminent or life-threatening deterioration.  Critical care was time spent personally by me on the following activities: development of treatment plan with patient and/or surrogate as well as nursing, discussions with consultants, evaluation of patient's response to treatment, examination of patient, obtaining history from patient or surrogate, ordering and performing treatments and interventions, ordering and review of laboratory studies, ordering and review of radiographic studies, pulse oximetry and re-evaluation of patient's condition.  Meds given in ED:  Medications  0.9 %  sodium chloride infusion (not administered)  sodium chloride 0.9 % bolus 1,000 mL (1,000 mLs Intravenous New Bag/Given 01/13/15 1601)    New Prescriptions   No medications on file   Filed Vitals:   01/13/15 1435 01/13/15 1547 01/13/15 1602 01/13/15 1630  BP: 150/83  157/76 158/89  Pulse: 73 66 35   Temp:      TempSrc:      Resp: 16 10 18 11   Height:      Weight:      SpO2: 95% 100% 99%     MDM  Patient presents from her primary care office for evaluation of low hemoglobin. Patient found to have hemoglobin of 7.3 at PCPs office and referred to ED for further evaluation. On arrival, patient is pale, however vitals signs are stable, she denies any chest pain, shortness of breath, dark or bloody stools. Her hemoglobin here 7.0, she is Hemoccult-positive without any frank blood on exam. Due to  patient's symptoms of weakness and new onset anemia secondary to likely GI bleed, will transfuse 2 units in the ED. Discussed patient presentation with my attending, Dr. Vanita Panda who agrees with plan for admission. Consult to medicine, Dr. Parks Ranger, patient admitted.  Final diagnoses:  Gastrointestinal hemorrhage, unspecified gastritis, unspecified gastrointestinal hemorrhage type       Comer Locket, PA-C 01/13/15 1910  Carmin Muskrat, MD 01/14/15 0002

## 2015-01-14 ENCOUNTER — Encounter (HOSPITAL_COMMUNITY): Payer: Self-pay | Admitting: Physician Assistant

## 2015-01-14 DIAGNOSIS — K922 Gastrointestinal hemorrhage, unspecified: Secondary | ICD-10-CM

## 2015-01-14 DIAGNOSIS — D649 Anemia, unspecified: Secondary | ICD-10-CM

## 2015-01-14 DIAGNOSIS — R195 Other fecal abnormalities: Secondary | ICD-10-CM

## 2015-01-14 LAB — TYPE AND SCREEN
ABO/RH(D): A POS
ANTIBODY SCREEN: NEGATIVE
Unit division: 0
Unit division: 0

## 2015-01-14 LAB — TROPONIN I
Troponin I: <0.03 ng/mL (ref <0.031)
Troponin I: <0.03 ng/mL (ref <0.031)

## 2015-01-14 LAB — CBC
HEMATOCRIT: 30.1 % — AB (ref 36.0–46.0)
HEMOGLOBIN: 9.7 g/dL — AB (ref 12.0–15.0)
MCH: 28.2 pg (ref 26.0–34.0)
MCHC: 32.2 g/dL (ref 30.0–36.0)
MCV: 87.5 fL (ref 78.0–100.0)
Platelets: 212 10*3/uL (ref 150–400)
RBC: 3.44 MIL/uL — ABNORMAL LOW (ref 3.87–5.11)
RDW: 15 % (ref 11.5–15.5)
WBC: 5.3 10*3/uL (ref 4.0–10.5)

## 2015-01-14 LAB — BASIC METABOLIC PANEL
ANION GAP: 7 (ref 5–15)
BUN: 19 mg/dL (ref 6–20)
CALCIUM: 9.1 mg/dL (ref 8.9–10.3)
CHLORIDE: 110 mmol/L (ref 101–111)
CO2: 22 mmol/L (ref 22–32)
Creatinine, Ser: 0.95 mg/dL (ref 0.44–1.00)
GFR calc Af Amer: >60 mL/min (ref >60)
GFR calc non Af Amer: 55 mL/min — ABNORMAL LOW (ref >60)
Glucose, Bld: 90 mg/dL (ref 65–99)
Potassium: 4.5 mmol/L (ref 3.5–5.1)
SODIUM: 139 mmol/L (ref 135–145)

## 2015-01-14 MED ORDER — PANTOPRAZOLE SODIUM 40 MG PO TBEC
40.0000 mg | DELAYED_RELEASE_TABLET | Freq: Two times a day (BID) | ORAL | Status: DC
  Administered 2015-01-14 – 2015-01-17 (×6): 40 mg via ORAL
  Filled 2015-01-14 (×6): qty 1

## 2015-01-14 NOTE — Care Management Note (Signed)
Case Management Note  Patient Details  Name: Carolyn [name] MRN: [id number] Date of Birth: 17-Dec-1930  Subjective/Objective:    Pt lives alone but son visits daily, dtr visits often and assists with grocery shopping.  Pt uses cane "sometimes", states she still does her own cleaning and meal preparation.                  Action/Plan: CM will continue to follow for any discharge needs.        Expected Discharge Plan:  Home/Self Care  In-House Referral:     Discharge planning Services  CM Consult  Post Acute Care Choice:    Choice offered to:     DME Arranged:    DME Agency:     HH Arranged:    HH Agency:     Status of Service:  In process, will continue to follow  Medicare Important Message Given:    Date Medicare IM Given:    Medicare IM give by:    Date Additional Medicare IM Given:    Additional Medicare Important Message give by:     If discussed at [place] of Stay Meetings, dates discussed:    Additional Comments:  Girard Cooter, RN 01/14/2015, 11:42 AM

## 2015-01-14 NOTE — Consult Note (Signed)
Carolyn [name] Gastroenterology Consult: 8:51 AM 01/14/2015  LOS: 1 day    Referring Provider: Dr Erin Hearing  Primary Care Physician:  Elisabeth Cara, PA-C Primary Gastroenterologist:  Dr. Olevia Perches    Reason for Consultation:  FOBT + anemia.    HPI: Carolyn [name] is a 81 y.o. female.  PMH HTN. Hip replacement 01/2014.  DVT 09/2014, Rx with Eliquis; simultaneously diagnosed with acute on chronic anemia (10/01/2013 Hgb 8.1, c/w 10.5) which was attributed to large DVT clot burden. Hgb 9.8 on 3/11, she was not transfused. S/p multiple abdomino-pelvic surgeries.  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     Hx GERD.  Barrett's esophagus (on 1999 & 2004 EGD, but not on 2007 EGD).  S/p failed Nissen fundoplication leading to [place] gastropexy.  Takes Nexium BID and Famotidine mid-day.  09/15/14 UGI series:  1. Small to moderate paraesophageal type hernia, with mild extrinsic compression on the distal thoracic esophagus. Superimposed small sliding-type hiatal hernia. 2. Spontaneous and fairly large volume gastroesophageal reflux whenever the patient was horizontal. 3. Presbyesophagus. 4. Negative cervical esophagus, and a 12.5 mm barium tablet passed freely to the stomach without delay. 5. Intra abdominal stomach and duodenum are within normal limits. 09/01/14 EGD: for recalcitrant heartburn.  Acute on chronic distal esophagitis.  Retained gastric contents c/w gastroparesis.  Mild antral gastritis. Path from esophagus c/w Barrett's, from stomach: reactive gastropathy.  2007 Colonoscopy:  Diverticulosis.  Admitted yesterday with progressive weakness over last 3 weeks. Anorexia for 2 weeks, but no weight loss.  No pyrosis or dysphagia.  No abdominal pain.  No chest pain or palpitations and no SOB.   Single episode of n/v, non-bloody/non-CG about one week ago.  Brown stools occurring in normal pattern which tends to constipation, managed with Miralax if she has not had BM in 2 to 3 days. Has not had nose bleeds or unusual derm bleeding or bruising. No NSAIDs.  Hgb 7.0, 9.7 post PRBCs x 2. MCV normal.  BUN currently 22-19, was up to 30s and 50s in 09/2014.  She is FOBT +.   CXR shows COPD, HH, Aortic vascular dz.    Past Medical History  Diagnosis Date  . Hypertension   . High cholesterol   . Hypothyroidism   . Asthma     "only when I smoked"  . Hiatal hernia     s/p gastropexy after failed Nissen fundoplication.   . Barrett's esophagus since at least 1985  . Arthritis 11/2013.     "both knees" (11/26/2013)  . Depression with anxiety 11/2013    "little bit"   . COPD (chronic obstructive pulmonary disease)   . Pneumonia 11/2013  . Diverticulosis of colon 1996  . CKD (chronic kidney disease)     GFR 55 11/2014     Past Surgical History  Procedure Laterality Date  . Nissen fundoplication      this ultimately failed and she underwent gastropexy  . Cholecystectomy    . Bladder suspension      .  bladder tack x 3.   . Abdominal hysterectomy    . Cataract extraction w/ intraocular lens  implant, bilateral Bilateral   . Appendectomy    . Hip arthroplasty Left 02/07/2014    Procedure: LEFT HIP PRESS FIT MONOPOLAR HEMIARTHROPLASTY ;  Surgeon: Marybelle Killings, MD;  Location: [place];  Service: Orthopedics;  Laterality: Left;  . Esophagogastroduodenoscopy N/A 09/01/2014    Procedure: ESOPHAGOGASTRODUODENOSCOPY (EGD);  Surgeon: Lafayette Dragon, MD;  Location: [facility];  Service: Endoscopy;  Laterality: N/A;  . Gastropexy  1989  . Enterocele repair  2003    with colopexy.     Prior to Admission medications   Medication Sig Start Date End Date Taking? Authorizing Provider  ALPRAZolam Duanne Moron) 1 MG tablet Take 1 mg by mouth at bedtime.  11/01/13  Yes Historical Provider, MD  apixaban (ELIQUIS)  5 MG TABS tablet Take 1 tablet (5 mg total) by mouth 2 (two) times daily. 10/10/14  Yes Katheren Shams, DO  bisacodyl (DULCOLAX) 10 MG suppository Place 1 suppository (10 mg total) rectally daily as needed for moderate constipation. 02/10/14  Yes Thurnell Lose, MD  Calcium Carbonate-Vitamin D (CALTRATE 600+D) 600-400 MG-UNIT per tablet Take 1 tablet by mouth daily.   Yes Historical Provider, MD  docusate sodium 100 MG CAPS Take 100 mg by mouth 2 (two) times daily. Patient taking differently: Take 100 mg by mouth 2 (two) times daily as needed.  02/10/14  Yes Thurnell Lose, MD  famotidine (PEPCID) 40 MG tablet Take 1 tablet (40 mg total) by mouth daily at 12 noon. 09/01/14  Yes Lafayette Dragon, MD  gabapentin (NEURONTIN) 300 MG capsule Take 600 mg by mouth at bedtime.  05/12/14 05/12/15 Yes Historical Provider, MD  levothyroxine (SYNTHROID, LEVOTHROID) 88 MCG tablet TAKE ONE TABLET BY MOUTH EVERY MORNING 05/12/14  Yes Historical Provider, MD  MAGNESIUM PO Take 1 tablet by mouth 2 (two) times daily.    Yes Historical Provider, MD  metoprolol succinate (TOPROL-XL) 50 MG 24 hr tablet Take 50 mg by mouth daily.  09/10/13  Yes Historical Provider, MD  NEXIUM 40 MG capsule Take 40 mg by mouth 2 (two) times daily before a meal.  10/03/13  Yes Historical Provider, MD  Omega-3 Fatty Acids (FISH OIL PO) Take 1 capsule by mouth 2 (two) times daily.    Yes Historical Provider, MD  polyethylene glycol (MIRALAX / GLYCOLAX) packet Take 17 g by mouth 2 (two) times daily. Patient taking differently: Take 17 g by mouth daily as needed.  02/10/14  Yes Thurnell Lose, MD  pseudoephedrine-guaifenesin (MUCINEX D) 60-600 MG per tablet Take 1 tablet by mouth 2 (two) times daily as needed for congestion.   Yes Historical Provider, MD  simvastatin (ZOCOR) 10 MG tablet Take 10 mg by mouth daily at 6 PM.  12/23/14  Yes Historical Provider, MD  sulfamethoxazole-trimethoprim (BACTRIM,SEPTRA) 400-80 MG per tablet Take by mouth daily.  12/26/14  Yes Historical Provider, MD  apixaban (ELIQUIS) 5 MG TABS tablet Take 2 tablets (10 mg total) by mouth 2 (two) times daily. 10/03/14 10/09/14  Katheren Shams, DO  HYDROcodone-acetaminophen (NORCO/VICODIN) 5-325 MG per tablet Take 1 tablet by mouth every 6 (six) hours as needed for moderate pain. 02/10/14   Thurnell Lose, MD  ondansetron (ZOFRAN) 4 MG tablet Take 1 tablet (4 mg total) by mouth once. Patient taking differently: Take 4 mg by mouth every 8 (eight) hours as needed for nausea or vomiting.  02/10/14  Thurnell Lose, MD    Scheduled Meds: . gabapentin  600 mg Oral QHS  . levothyroxine  88 mcg Oral QAC breakfast  . metoprolol succinate  50 mg Oral Daily  . pantoprazole (PROTONIX) IV  40 mg Intravenous Q12H  . rosuvastatin  10 mg Oral Daily  . sodium chloride  3 mL Intravenous Q12H   Infusions: . sodium chloride 100 mL/hr at 01/14/15 0848   PRN Meds: acetaminophen **OR** acetaminophen, ALPRAZolam, polyethylene glycol   Allergies as of 01/13/2015 - Review Complete 01/13/2015  Allergen Reaction Noted  . Penicillins Hives 11/26/2013    Family History  Problem Relation Age of Onset  . Stomach cancer Mother   . Heart disease Father   . Heart disease Brother   . Bladder Cancer Sister     History   Social History  . Marital Status: Widowed    Spouse Name: N/A  . Number of Children: 3  . Years of Education: N/A   Occupational History  . Retired    Social History Main Topics  . Smoking status: Former Smoker -- 0.25 packs/day for 40 years    Types: Cigarettes  . Smokeless tobacco: Never Used     Comment: "quit smoking ~ 2005"  . Alcohol Use: No     Comment: 11/26/2013 "drank a little alcohol; last drink was a long time ago"  . Drug Use: No  . Sexual Activity: No   Other Topics Concern  . Not on file   Social History Narrative    REVIEW OF SYSTEMS: Constitutional:  Per HPI ENT:  No nose bleeds.  Wears full dentures Pulm:  Per HPI.  No cough CV:   No palpitations, minimal left ankle/pedal edema.  GU:  No hematuria, no frequency GI:  Per HPI Heme:  Never took iron in past   Transfusions:  Never before yesterday Neuro:  No headaches, no peripheral tingling or numbness.  Wears reading glasses.  No visual distrurbance Derm:  No itching, no rash or sores.  Endocrine:  No sweats or chills.  No polyuria or dysuria Immunization:  No queried Travel:  None beyond local counties in last few months.    PHYSICAL EXAM: Vital signs in last 24 hours: Filed Vitals:   01/14/15 0736  BP: 128/70  Pulse: 77  Temp: 98.2 F (36.8 C)  Resp: 16   Wt Readings from Last 3 Encounters:  01/13/15 159 lb (72.122 kg)  10/02/14 163 lb (73.936 kg)  09/01/14 163 lb (73.936 kg)    General: very pleasant, alert, comfortable.  Pale but not ill looking. Obvious that great care taken to her appearance.  Head:  No asymmetry or swelling  Eyes:  + conj pallor Ears:  Not HOH  Nose:  No congestion or discharge Mouth:  Clear, moist MM.  Dentures in place.  Tongue midline Neck:  No mass, no TMG, no JVD Lungs:  Clear bil.  No dyspnea or cough Heart: RRR.  No MRG Abdomen:  Soft, NT, ND.  No mass or HSM.  No hernias or bruits.   Rectal: scant specks of dark brown stool not FOBT tested.  No mass.  + External hemorrhoidal tags  Musc/Skeltl: no joint swelling or deformity Extremities:  Scant left pedal edema without pitting.  3+ right, 1 to 2+ left pedal pulses.   Neurologic:  Oriented x 3.  Some work finding difficulty but fully oriented.  Skin:  No rash, no sores. Tattoos:  none Nodes:  No cervical or inguinal adenopathy.  Psych:  Pleasant, cooperative, in good spirits.      LAB RESULTS:  Recent Labs  01/13/15 1322 01/14/15 0125  WBC 5.2 5.3  HGB 7.0* 9.7*  HCT 23.1* 30.1*  PLT 286 212   BMET Lab Results  Component Value Date   NA 139 01/14/2015   NA 136 01/13/2015   NA 137 10/03/2014   K 4.5 01/14/2015   K 4.5 01/13/2015   K 4.8  10/03/2014   CL 110 01/14/2015   CL 109 01/13/2015   CL 105 10/03/2014   CO2 22 01/14/2015   CO2 22 01/13/2015   CO2 25 10/03/2014   GLUCOSE 90 01/14/2015   GLUCOSE 109* 01/13/2015   GLUCOSE 96 10/03/2014   BUN 19 01/14/2015   BUN 22* 01/13/2015   BUN 26* 10/03/2014   CREATININE 0.95 01/14/2015   CREATININE 1.09* 01/13/2015   CREATININE 1.03 10/03/2014   CALCIUM 9.1 01/14/2015   CALCIUM 9.1 01/13/2015   CALCIUM 9.0 10/03/2014   LFT No results for input(s): PROT, ALBUMIN, AST, ALT, ALKPHOS, BILITOT, BILIDIR, IBILI in the last 72 hours. PT/INR Lab Results  Component Value Date   INR 1.26 01/13/2015   INR 1.11 10/02/2014   INR 1.01 02/07/2014    RADIOLOGY STUDIES: Dg Chest 2 View  01/13/2015   CLINICAL DATA:  Not eating well or feeling well. Low hemoglobin and hematocrit  EXAM: CHEST  2 VIEW  COMPARISON:  02/07/2014.  FINDINGS: The heart size appears normal. Moderate hiatal hernia noted. There is no pleural effusion or edema. The lungs are hyperinflated and there are coarsened interstitial markings bilaterally. Aortic atherosclerosis noted.  IMPRESSION: 1. Chronic changes of COPD. 2. Hiatal hernia 3. Aortic atherosclerosis.   Electronically Signed   By: Kerby Moors M.D.   On: 01/13/2015 15:24     IMPRESSION:   *  Acute on chronic anemia, symptomatic.  FOBT +.  Hgb improved after 2 PRBCs.   *  Long hx Barrett's esophagus, present at most recent EGD of 08/2014  *  colono diverticulosis.  Last colonoscopy 2007  *  Gastroparesis.   *  09/2014 DVT.  On Eliquis.   *  S/p multiple abdomino-pelvic surgeries.   *  COPD.      PLAN:     *  Per Dr Carlean Purl.  Ok to eat today as no plans to scope today.   *  Switch to oral BID PPI.  IVF stopped.  CBC in AM.  Continue to hold Eliquis.    Azucena Freed  01/14/2015, 8:51 AM Pager: [phone]  [facility] Attending  I have also seen and assessed the patient and agree with the advanced practitioner's assessment and plan. My  Hx and PE same.  Not clear if she has had a GI bleed but is heme + and Hgb declined on Eliquis (being held).  I have explained situation and that I think she needs a colonoscopy and probably an EGD despite having had one early 2016 - she had a gastropathy and wonder if she actually has GAVE from the appearance (antral vascular ectasia) which could need to be ablated.  Will tentatively schedule egd/colon for Fri AM and decide for sure tomorrow.  Gatha Mayer, MD, [facility] [phone] (pager) 01/14/2015 4:40 PM

## 2015-01-14 NOTE — Progress Notes (Signed)
Family Medicine Teaching Service Daily Progress Note Intern Pager: [phone]  Patient name: Carolyn [name] Medical record number: [id number] Date of birth: 01-20-1931 Age: 81 y.o. Gender: female  Primary Care Provider: Elisabeth Cara, PA-C Consultants: GI Code Status: FULL  Pt Overview and Major Events to Date:  06/21: Admitted to SDU  Assessment and Plan: Carolyn [name] is a 81 y.o. female presenting with gradual worsening DOE and generalized weakness x 2-3 weeks, with significant worsening in past 1 week. PMH is significant for H/o L-LE DVT (09/2014) currently on anticoagulation therapy (Eliquis), mod-severe diverticulosis, GERD with esophagitis (dx Barrett's), h/o gastritis, s/p Left-hip arthroplasty for frx, HTN, HLD, Hypothyroidism, Anxiety, Depression, former smoker.  # Symptomatic Anemia, suspected subacute on chronic Blood Loss Anemia, with suspected GIB Chronic h/o anemia Hgb b/l 10-11, recent acute drop in Hgb 09/2014 with DVT since improved, now again with acute vs subacute dec Hgb. MCV 88.2, normocytic, inc RDW, no recent iron studies or ferritin. Suspect worsening anemia is etiology of symptoms, presumed due to GIB unclear source, FOBT (+ in ED), without prior h/o GIB. Known diverticulosis on colonoscopy (2007) but clinically less likely without BRBPR, h/o gastritis w/o PUD, however on eliquis anticoag may have AVM or occult GIB. Cannot rule out colon CA. No known CAD, without CP. Hgb 7.0>9.7. Trop neg x1 EKG NSR, nonischemic - S/p 2u PRBC transfusion - Monitor hgb daily - Eliquis held.  Question is whether to resume at all.  Will discuss risk vs benefits of prolonged anticoagulation in the setting of GI history and bleed.  - Check ferritin (consider checking additional anemia panel in future as indicated, however s/p transfusion) - Cycle Troponin-I x 3 - IVF while NPO - Protonix IV 40mg  q 12 hr - Consulted [facility], discussed case with Dr. Henrene Pastor on admit. Anticipate to  see patient this AM. Agree with NPO o/n, transfusion, holding eliquis in anticipation of possible EGD / colonoscopy today.  # GERD with esophagitis, gastritis.  Followed by [facility] (Dr. Olevia Perches), last EGD 08/2014 for h/o GERD s/p Nissen fundoplication with results of esophagitis (bx Barrett's), gastritis, gastroparesis, with negative bx for malignancy - PPI as above - F/u GI recs, consider endoscopy  # Recent L-LE DVT, on anticoagulation therapy.  History of 1st DVT 09/2014, following < 8 months after L-hip arthroplasty (01/2014), presumed provoked. Previously plan to be on eliquis x 6 months. Concerns with fall risk. - Hold eliquis today 6/21, if stable anticipate resuming pending GI recs.    # HTN. BP stable - Hold Metoprolol-XL on admit, will resume today since hgb stable   # HLD - Continue home Crestor 10mg  daily  # Hypothyroidism - Continue home Levothyroxine 68mcg daily  # OA, bilateral knees, h/o hip fracture.  Currently stable without acute arthritis flare or pain - Hold home Vicodin - Continue home Gabapentin 600mg  qhs - Tylenol PRN  # Anxiety, Depression - Continue home Xanax 0.5mg  qhs (does take most nights for sleep/anxiety) - No longer on celexa  FEN/GI: NS @ 100cc/hr (after 2u PRBC) x 12 hr while NPO (sips, chips, meds) pending possible endoscopy Prophylaxis: SCDs, holding therapeutic anticoagulation (Eliquis)  Disposition: Discharge pending GI evaluation and improvement in symptoms  Subjective:  Patient reports that she is feeling well this morning.  She was able to get out of bed and had no dizziness.  Denies, CP, SOB, dizziness, vision changes, hematochezia, melena.  Some nausea intermittently but no vomiting.  Objective: Temp:  [97.5 F (36.4 C)-98.2  F (36.8 C)] 97.8 F (36.6 C) (06/22 0510) Pulse Rate:  [26-98] 82 (06/22 0510) Resp:  [10-18] 11 (06/22 0510) BP: (119-172)/(60-107) 133/67 mmHg (06/22 0510) SpO2:  [47 %-100 %] 97 % (06/22 0510) Weight:   [159 lb (72.122 kg)] 159 lb (72.122 kg) (06/21 1256) Physical Exam: General: awake, alert, well appearing, NAD Cardiovascular: RRR, no murmurs, +2 radial pulse Respiratory: CTAB, no increased WOB Abdomen: soft, NT/ND, +BS Extremities: WWP, no edema  Laboratory:  Recent Labs Lab 01/13/15 1322 01/14/15 0125  WBC 5.2 5.3  HGB 7.0* 9.7*  HCT 23.1* 30.1*  PLT 286 212    Recent Labs Lab 01/13/15 1322 01/14/15 0125  NA 136 139  K 4.5 4.5  CL 109 110  CO2 22 22  BUN 22* 19  CREATININE 1.09* 0.95  CALCIUM 9.1 9.1  GLUCOSE 109* 90   Cardiac Panel (last 3 results)  Recent Labs  01/14/15 0125  TROPONINI <0.03   Imaging/Diagnostic Tests: Dg Chest 2 View  01/13/2015   CLINICAL DATA:  Not eating well or feeling well. Low hemoglobin and hematocrit  EXAM: CHEST  2 VIEW  COMPARISON:  02/07/2014.  FINDINGS: The heart size appears normal. Moderate hiatal hernia noted. There is no pleural effusion or edema. The lungs are hyperinflated and there are coarsened interstitial markings bilaterally. Aortic atherosclerosis noted.  IMPRESSION: 1. Chronic changes of COPD. 2. Hiatal hernia 3. Aortic atherosclerosis.   Electronically Signed   By: Kerby Moors M.D.   On: 01/13/2015 15:24   Janora Norlander, DO 01/14/2015, 6:49 AM PGY-1, [place] Intern pager: [phone], text pages welcome

## 2015-01-15 LAB — TSH: TSH: 14.081 u[IU]/mL — ABNORMAL HIGH (ref 0.350–4.500)

## 2015-01-15 LAB — URINALYSIS, ROUTINE W REFLEX MICROSCOPIC
BILIRUBIN URINE: NEGATIVE
Glucose, UA: NEGATIVE mg/dL
HGB URINE DIPSTICK: NEGATIVE
Ketones, ur: NEGATIVE mg/dL
Leukocytes, UA: NEGATIVE
Nitrite: NEGATIVE
PROTEIN: NEGATIVE mg/dL
Specific Gravity, Urine: 1.003 — ABNORMAL LOW (ref 1.005–1.030)
Urobilinogen, UA: 0.2 mg/dL (ref 0.0–1.0)
pH: 6 (ref 5.0–8.0)

## 2015-01-15 LAB — CBC
HEMATOCRIT: 29.2 % — AB (ref 36.0–46.0)
Hemoglobin: 9.4 g/dL — ABNORMAL LOW (ref 12.0–15.0)
MCH: 28.2 pg (ref 26.0–34.0)
MCHC: 32.2 g/dL (ref 30.0–36.0)
MCV: 87.7 fL (ref 78.0–100.0)
Platelets: 212 10*3/uL (ref 150–400)
RBC: 3.33 MIL/uL — ABNORMAL LOW (ref 3.87–5.11)
RDW: 15.1 % (ref 11.5–15.5)
WBC: 4.5 10*3/uL (ref 4.0–10.5)

## 2015-01-15 LAB — FERRITIN: Ferritin: 8 ng/mL — ABNORMAL LOW (ref 11–307)

## 2015-01-15 LAB — T4, FREE: Free T4: 1.02 ng/dL (ref 0.61–1.12)

## 2015-01-15 MED ORDER — PEG 3350-KCL-NA BICARB-NACL 420 G PO SOLR
4000.0000 mL | Freq: Once | ORAL | Status: AC
  Administered 2015-01-15: 4000 mL via ORAL
  Filled 2015-01-15: qty 4000

## 2015-01-15 MED ORDER — BISACODYL 5 MG PO TBEC
10.0000 mg | DELAYED_RELEASE_TABLET | Freq: Once | ORAL | Status: AC
  Administered 2015-01-15: 10 mg via ORAL

## 2015-01-15 MED ORDER — METOCLOPRAMIDE HCL 5 MG/ML IJ SOLN
5.0000 mg | Freq: Once | INTRAMUSCULAR | Status: AC
  Administered 2015-01-16: 5 mg via INTRAVENOUS
  Filled 2015-01-15: qty 1

## 2015-01-15 MED ORDER — METOCLOPRAMIDE HCL 5 MG/ML IJ SOLN
5.0000 mg | Freq: Once | INTRAMUSCULAR | Status: AC
  Administered 2015-01-15: 5 mg via INTRAVENOUS
  Filled 2015-01-15: qty 1

## 2015-01-15 MED ORDER — SODIUM CHLORIDE 0.9 % IV SOLN
INTRAVENOUS | Status: DC
  Administered 2015-01-15: 21:00:00 via INTRAVENOUS

## 2015-01-15 NOTE — Progress Notes (Signed)
          Daily Rounding Note  01/15/2015, 8:30 AM  LOS: 2 days   SUBJECTIVE:       No bloody stool.  Did have brown stool this AM.  Feels well.   OBJECTIVE:         Vital signs in last 24 hours:    Temp:  [97.8 F (36.6 C)-98.4 F (36.9 C)] 98.1 F (36.7 C) (06/23 0417) Pulse Rate:  [62-74] 70 (06/23 0417) Resp:  [11-24] 18 (06/23 0417) BP: (119-169)/(58-121) 119/61 mmHg (06/23 0417) SpO2:  [96 %-100 %] 96 % (06/23 0417) Last BM Date: 01/14/15 Filed Weights   01/13/15 1256  Weight: 159 lb (72.122 kg)   General: pleasant, comfortable.  Pale   Heart: RRR.  No MRG Chest: clear bil.   Abdomen: soft, NT, active BS Extremities: no edema  Neuro/Psych:  Alert, pleasant.  No gross deficits.      Lab Results:  Recent Labs  01/13/15 1322 01/14/15 0125 01/15/15 0426  WBC 5.2 5.3 4.5  HGB 7.0* 9.7* 9.4*  HCT 23.1* 30.1* 29.2*  PLT 286 212 212      ASSESMENT:   * Acute on chronic anemia, symptomatic. Melenic . Hgb improved/stable after 2 PRBCs.   * Long hx Barrett's esophagus, present at most recent EGD of 08/2014.  Pill dysphagia.   * colon diverticulosis. Last colonoscopy 2007  * Gastroparesis. No n/v.   * 09/2014 DVT. On Eliquis.   *  AKI.  Resolved.   * S/p multiple abdomino-pelvic surgeries.   * COPD.    PLAN   *  Split dose golytely prep starts this PM *  Colon/EGD set for 11 AM tomorrow.  *  Clears.   *  HGB in AM.     Azucena Freed  01/15/2015, 8:30 AM Pager: [phone]  Agree with Ms. Sandi Carne assessment and plan. Gatha Mayer, MD, [facility]

## 2015-01-15 NOTE — Progress Notes (Signed)
Family Medicine Teaching Service Daily Progress Note Intern Pager: [phone]  Patient name: Carolyn [name] Medical record number: [id number] Date of birth: [birth year] Age: 81 y.o. Gender: female  Primary Care Provider: Elisabeth Cara, PA-C Consultants: GI Code Status: FULL  Pt Overview and Major Events to Date:  06/21: Admitted to SDU 06/22: to telemetryu floor  Assessment and Plan: Carolyn [name] is a 81 y.o. female presenting with gradual worsening DOE and generalized weakness x 2-3 weeks, with significant worsening in past 1 week. PMH is significant for H/o L-LE DVT (09/2014) currently on anticoagulation therapy (Eliquis), mod-severe diverticulosis, GERD with esophagitis (dx Barrett's), h/o gastritis, s/p Left-hip arthroplasty for frx, HTN, HLD, Hypothyroidism, Anxiety, Depression, former smoker.  # Symptomatic Anemia, suspected subacute on chronic Blood Loss Anemia, with suspected GIB Chronic h/o anemia Hgb b/l 10-11, recent acute drop in Hgb 09/2014 with DVT since improved, now again with acute vs subacute dec Hgb. MCV 88.2, normocytic, inc RDW, no recent iron studies or ferritin. Suspect worsening anemia is etiology of symptoms, presumed due to GIB unclear source, FOBT (+ in ED), without prior h/o GIB. Known diverticulosis on colonoscopy (2007) but clinically less likely without BRBPR, h/o gastritis w/o PUD, however on eliquis anticoag may have AVM or occult GIB. Cannot rule out colon CA. No known CAD, without CP. Hgb 7.0>9.7>9.4. Trop neg x3 EKG NSR, nonischemic. Ferritin 8 - S/p 2u PRBC transfusion - Monitor hgb daily  - Eliquis held.  Question is whether to resume at all.  Discussed risk vs benefits of prolonged anticoagulation in the setting of GI history and bleed.  Patient unsure at this time and would like to discuss with daughters. - Consider checking additional anemia panel in future as indicated, Will not do here since s/p transfusion - Protonix 40mg  q 12 hr - c/s [facility], appreciate recs: ?gastric antral vascular ectasia.  May need ablation if so.  Scheduled for EGD/ colonoscopy for Fri am.  # GERD with esophagitis, gastritis.  Followed by [facility] (Dr. Olevia Perches), last EGD 08/2014 for h/o GERD s/p Nissen fundoplication with results of esophagitis (bx Barrett's), gastritis, gastroparesis, with negative bx for malignancy - PPI as above - F/u GI recs, consider endoscopy  # Recent L-LE DVT, on anticoagulation therapy.  History of 1st DVT 09/2014, following < 8 months after L-hip arthroplasty (01/2014), presumed provoked. Previously plan to be on eliquis x 6 months. Concerns with fall risk. - Hold eliquis today 6/21, if stable anticipate resuming pending GI recs.    # HTN. BP stable - Hold Metoprolol-XL on admit, will resume today since hgb stable   # HLD - Continue home Crestor 10mg  daily  # Hypothyroidism.  TSH 14.081. - Recheck TSH out patient. - Will check FT4 - Continue home Levothyroxine 70mcg daily  # OA, bilateral knees, h/o hip fracture.  Currently stable without acute arthritis flare or pain - Hold home Vicodin - Continue home Gabapentin 600mg  qhs - Tylenol PRN  # Anxiety, Depression - Continue home Xanax 0.5mg  qhs (does take most nights for sleep/anxiety) - No longer on celexa  FEN/GI: NS @ 100cc/hr (after 2u PRBC) x 12 hr while NPO (sips, chips, meds) pending possible endoscopy Prophylaxis: SCDs, holding therapeutic anticoagulation (Eliquis)  Disposition: Discharge pending GI evaluation and improvement in symptoms  Subjective:  Patient reports that she is feeling well this morning.  She continues to be able to get out of bed and have no dizziness.  Denies, CP, SOB, dizziness, vision changes, hematochezia.  Endorsing melena/diarrhea this am.  Objective: Temp:  [97.8 F (36.6 C)-98.4 F (36.9 C)] 98.1 F (36.7 C) (06/23 0417) Pulse Rate:  [62-74] 70 (06/23 0417) Resp:  [11-18] 18 (06/23 0417) BP: (119-163)/(58-121) 119/61 mmHg (06/23  0417) SpO2:  [96 %-100 %] 96 % (06/23 0417) Physical Exam: General: awake, alert, well appearing, lying in bed, NAD Cardiovascular: RRR, no murmurs, +2 radial pulse Respiratory: CTAB, no increased WOB Abdomen: soft, NT/ND, +BS Extremities: WWP, no edema  Laboratory:  Recent Labs Lab 01/13/15 1322 01/14/15 0125 01/15/15 0426  WBC 5.2 5.3 4.5  HGB 7.0* 9.7* 9.4*  HCT 23.1* 30.1* 29.2*  PLT 286 212 212    Recent Labs Lab 01/13/15 1322 01/14/15 0125  NA 136 139  K 4.5 4.5  CL 109 110  CO2 22 22  BUN 22* 19  CREATININE 1.09* 0.95  CALCIUM 9.1 9.1  GLUCOSE 109* 90   Cardiac Panel (last 3 results)  Recent Labs  01/14/15 0125 01/14/15 0825  TROPONINI <0.03 <0.03   Imaging/Diagnostic Tests: No results found. Janora Norlander, DO 01/15/2015, 9:32 AM PGY-1, [place] Intern pager: [phone], text pages welcome

## 2015-01-16 ENCOUNTER — Encounter (HOSPITAL_COMMUNITY): Payer: Self-pay | Admitting: *Deleted

## 2015-01-16 ENCOUNTER — Inpatient Hospital Stay (HOSPITAL_COMMUNITY): Payer: Medicare Other | Admitting: Anesthesiology

## 2015-01-16 ENCOUNTER — Encounter (HOSPITAL_COMMUNITY)
Admission: EM | Disposition: A | Discharge status: Home / Self Care | Payer: Self-pay | Source: Home / Self Care | Attending: Family Medicine

## 2015-01-16 DIAGNOSIS — D125 Benign neoplasm of sigmoid colon: Secondary | ICD-10-CM

## 2015-01-16 DIAGNOSIS — D62 Acute posthemorrhagic anemia: Secondary | ICD-10-CM

## 2015-01-16 DIAGNOSIS — K921 Melena: Secondary | ICD-10-CM

## 2015-01-16 DIAGNOSIS — Z86010 Personal history of colonic polyps: Secondary | ICD-10-CM

## 2015-01-16 DIAGNOSIS — K922 Gastrointestinal hemorrhage, unspecified: Secondary | ICD-10-CM | POA: Diagnosis present

## 2015-01-16 DIAGNOSIS — K31819 Angiodysplasia of stomach and duodenum without bleeding: Secondary | ICD-10-CM | POA: Diagnosis present

## 2015-01-16 DIAGNOSIS — Z860101 Personal history of adenomatous and serrated colon polyps: Secondary | ICD-10-CM

## 2015-01-16 HISTORY — DX: Angiodysplasia of stomach and duodenum without bleeding: K31.819

## 2015-01-16 HISTORY — PX: COLONOSCOPY: SHX5424

## 2015-01-16 HISTORY — PX: ESOPHAGOGASTRODUODENOSCOPY: SHX5428

## 2015-01-16 LAB — BASIC METABOLIC PANEL
ANION GAP: 12 (ref 5–15)
BUN: 8 mg/dL (ref 6–20)
CHLORIDE: 105 mmol/L (ref 101–111)
CO2: 21 mmol/L — ABNORMAL LOW (ref 22–32)
Calcium: 9.5 mg/dL (ref 8.9–10.3)
Creatinine, Ser: 0.85 mg/dL (ref 0.44–1.00)
Glucose, Bld: 85 mg/dL (ref 65–99)
POTASSIUM: 3.8 mmol/L (ref 3.5–5.1)
Sodium: 138 mmol/L (ref 135–145)

## 2015-01-16 LAB — CBC
HEMATOCRIT: 31.2 % — AB (ref 36.0–46.0)
Hemoglobin: 10.1 g/dL — ABNORMAL LOW (ref 12.0–15.0)
MCH: 28.3 pg (ref 26.0–34.0)
MCHC: 32.4 g/dL (ref 30.0–36.0)
MCV: 87.4 fL (ref 78.0–100.0)
Platelets: 231 10*3/uL (ref 150–400)
RBC: 3.57 MIL/uL — ABNORMAL LOW (ref 3.87–5.11)
RDW: 14.9 % (ref 11.5–15.5)
WBC: 5.4 10*3/uL (ref 4.0–10.5)

## 2015-01-16 SURGERY — COLONOSCOPY
Anesthesia: Monitor Anesthesia Care

## 2015-01-16 MED ORDER — PROPOFOL INFUSION 10 MG/ML OPTIME
INTRAVENOUS | Status: DC | PRN
  Administered 2015-01-16: 200 ug/kg/min via INTRAVENOUS

## 2015-01-16 MED ORDER — LACTATED RINGERS IV SOLN
INTRAVENOUS | Status: DC
  Administered 2015-01-16: 10:00:00 via INTRAVENOUS

## 2015-01-16 MED ORDER — BUTAMBEN-TETRACAINE-BENZOCAINE 2-2-14 % EX AERO
INHALATION_SPRAY | CUTANEOUS | Status: DC | PRN
  Administered 2015-01-16: 2 via TOPICAL

## 2015-01-16 MED ORDER — APIXABAN 5 MG PO TABS: 5.0000 mg | ORAL_TABLET | Freq: Two times a day (BID) | ORAL | Status: DC

## 2015-01-16 MED ORDER — FERROUS SULFATE 325 (65 FE) MG PO TABS: 325.0000 mg | ORAL_TABLET | Freq: Two times a day (BID) | ORAL | Status: DC

## 2015-01-16 MED ORDER — SODIUM CHLORIDE 0.9 % IV SOLN
510.0000 mg | Freq: Once | INTRAVENOUS | Status: AC
  Administered 2015-01-16: 510 mg via INTRAVENOUS
  Filled 2015-01-16: qty 17

## 2015-01-16 MED ORDER — PHENYLEPHRINE HCL 10 MG/ML IJ SOLN
INTRAMUSCULAR | Status: DC | PRN
  Administered 2015-01-16: 120 ug via INTRAVENOUS
  Administered 2015-01-16 (×2): 80 ug via INTRAVENOUS
  Administered 2015-01-16: 120 ug via INTRAVENOUS

## 2015-01-16 MED ORDER — LIDOCAINE HCL (CARDIAC) 20 MG/ML IV SOLN
INTRAVENOUS | Status: DC | PRN
  Administered 2015-01-16: 100 mg via INTRAVENOUS

## 2015-01-16 NOTE — Op Note (Signed)
[facility] [place], [address]   COLONOSCOPY PROCEDURE REPORT  PATIENT: Carolyn [name], Carolyn [name]  MR#: [id number] BIRTHDATE: [birth year] , 83  yrs. old GENDER: female ENDOSCOPIST: Gatha Mayer, MD, [facility] PROCEDURE DATE:  01/16/2015 PROCEDURE:   Colonoscopy, diagnostic First Screening Colonoscopy - Avg.  risk and is 50 yrs.  old or older - No.  Prior Negative Screening - Now for repeat screening. N/A  History of Adenoma - Now for follow-up colonoscopy & has been > or = to 3 yrs.  N/A  Polyps removed today? Yes ASA CLASS:   Class III INDICATIONS:Evaluation of unexplained GI bleeding and Patient is not applicable for Colorectal Neoplasm Risk Assessment for this procedure. MEDICATIONS: Monitored anesthesia care and Per Anesthesia  DESCRIPTION OF PROCEDURE:   After the risks benefits and alternatives of the procedure were thoroughly explained, informed consent was obtained.  The digital rectal exam revealed no abnormalities of the rectum.   The [employer] Adult Colon [id number] endoscope was introduced through the anus and advanced to the cecum, which was identified by both the appendix and ileocecal valve. No adverse events experienced.   The quality of the prep was good.  (Nulytley was used)  The instrument was then slowly withdrawn as the colon was fully examined. Estimated blood loss is zero unless otherwise noted in this procedure report.      COLON FINDINGS: 1) 2 diminutive erythematous sigmoid polyps removed cold snare 2) Sigmoid diverticuolosis 3) Otherwise normal good prep.  Retroflexed views revealed no abnormalities. The time to cecum = 5.7 Withdrawal time = 10.8   The scope was withdrawn and the procedure completed. COMPLICATIONS: There were no immediate complications.  ENDOSCOPIC IMPRESSION: 1) 2 diminutive erythematous sigmoid polyps removed cold snare 2) Sigmoid diverticuolosis 3) Otherwise normal good prep  RECOMMENDATIONS: EGD  next  eSigned:  Gatha Mayer, MD, [facility] 01/16/2015 10:54 AM   cc: Vedia Coffer PA-C

## 2015-01-16 NOTE — Progress Notes (Signed)
Patient completed the [place]. Stool watery and clear.

## 2015-01-16 NOTE — Discharge Instructions (Signed)
You were admitted with symptomatic anemia.  You were transfused 2 units of blood.  You also had an evaluation by gastroenterology (EGD and a colonoscopy).  Gastroenterology is recommending that you be seen at least every 3 months to get checked for anemia.  You will resume your blood thinner for the next 3 months.  You should resume Eliquis on Sunday 01/18/15.  Please make sure to make an appointment with your primary doctor to be seen in 1 week.   Bloody Stools Bloody stools means there is blood in your poop (stool). It is a sign that there is a problem somewhere in the digestive system. It is important for your doctor to find the cause of your bleeding, so the problem can be treated.  HOME CARE  Only take medicine as told by your doctor.  Eat foods with fiber (prunes, bran cereals).  Drink enough fluids to keep your pee (urine) clear or pale yellow.  Sit in warm water (sitz bath) for 10 to 15 minutes as told by your doctor.  Know how to take your medicines (enemas, suppositories) if advised by your doctor.  Watch for signs that you are getting better or getting worse. GET HELP RIGHT AWAY IF:   You are not getting better.  You start to get better but then get worse again.  You have new problems.  You have severe bleeding from the place where poop comes out (rectum) that does not stop.  You throw up (vomit) blood.  You feel weak or pass out (faint).  You have a fever. MAKE SURE YOU:   Understand these instructions.  Will watch your condition.  Will get help right away if you are not doing well or get worse. Document Released: 06/29/2009 Document Revised: 10/03/2011 Document Reviewed: 11/26/2010 [facility] Patient Information 2015 [place], [place]. This information is not intended to replace advice given to you by your health care provider. Make sure you discuss any questions you have with your health care provider.

## 2015-01-16 NOTE — Anesthesia Preprocedure Evaluation (Signed)
Anesthesia Evaluation  Patient identified by MRN, date of birth, ID band Patient awake    Reviewed: Allergy & Precautions, NPO status , Patient's Chart, lab work & pertinent test results, reviewed documented beta blocker date and time   Airway Mallampati: I  TM Distance: >3 FB Neck ROM: Full    Dental   Pulmonary asthma , COPDformer smoker,  breath sounds clear to auscultation        Cardiovascular hypertension, Pt. on medications and Pt. on home beta blockers Rhythm:Regular Rate:Normal     Neuro/Psych negative neurological ROS     GI/Hepatic Neg liver ROS, hiatal hernia, GERD-  ,  Endo/Other  Hypothyroidism   Renal/GU negative Renal ROS     Musculoskeletal  (+) Arthritis -,   Abdominal   Peds  Hematology  (+) anemia ,   Anesthesia Other Findings   Reproductive/Obstetrics                             Anesthesia Physical Anesthesia Plan  ASA: III  Anesthesia Plan: MAC   Post-op Pain Management:    Induction: Intravenous  Airway Management Planned: Natural Airway, Nasal Cannula and Simple Face Mask  Additional Equipment:   Intra-op Plan:   Post-operative Plan:   Informed Consent: I have reviewed the patients History and Physical, chart, labs and discussed the procedure including the risks, benefits and alternatives for the proposed anesthesia with the patient or authorized representative who has indicated his/her understanding and acceptance.   Dental advisory given  Plan Discussed with: CRNA  Anesthesia Plan Comments:         Anesthesia Quick Evaluation

## 2015-01-16 NOTE — Transfer of Care (Signed)
Immediate Anesthesia Transfer of Care Note  Patient: Carolyn [name]  Procedure(s) Performed: Procedure(s): COLONOSCOPY (N/A) ESOPHAGOGASTRODUODENOSCOPY (EGD) (N/A)  Patient Location: Endoscopy Unit  Anesthesia Type:MAC  Level of Consciousness: sedated and responds to stimulation  Airway & Oxygen Therapy: Patient Spontanous Breathing and Patient connected to nasal cannula oxygen  Post-op Assessment: Report given to RN, Post -op Vital signs reviewed and stable and Patient moving all extremities  Post vital signs: Reviewed and stable  Last Vitals:  Filed Vitals:   01/16/15 1000  BP: 157/92  Pulse:   Temp:   Resp: 14    Complications: No apparent anesthesia complications

## 2015-01-16 NOTE — Discharge Summary (Signed)
[facility] Discharge Summary  Patient name: Carolyn [name] Medical record number: [id number] Date of birth: 05-11-1931 Age: 81 y.o. Gender: female Date of Admission: 01/13/2015  Date of Discharge: 01/17/15 Admitting Physician: Lind Covert, MD  Primary Care Provider: Elisabeth Cara, PA-C Consultants: gastroenterology  Indication for Hospitalization: symptomatic anemia  Discharge Diagnoses/Problem List:  Acute blood loss anemia Benign neoplasm of the sigmoid colon Gastric antral vascular ectasia (GAVE) GI bleed  Disposition: Discharge home with children  Discharge Condition: Stable/Improved  Discharge Exam:  General: awake, alert, well appearing, lying in bed, NAD Cardiovascular: RRR, no murmurs, no conjunctival pallor Respiratory: CTAB, no increased WOB Abdomen: soft, NT/ND, +BS Extremities: WWP, no edema  Brief Hospital Course:  Carolyn [name] is a 81 y.o. female that presented with gradual worsening DOE and generalized weakness x 2-3 weeks, with significant worsening in past 1 week. PMH is significant for H/o L-LE DVT (09/2014) currently on anticoagulation therapy (Eliquis), mod-severe diverticulosis, GERD with esophagitis (dx Barrett's), h/o gastritis, s/p Left-hip arthroplasty for frx, HTN, HLD, Hypothyroidism, Anxiety, Depression, former smoker.  In the ED, hgb 7.0 (baseline 10-11).  She was FOBT+.  Patient was transfused with 2 units of pRBCs in the ED.  She was admitted to the [facility] for continued evaluation/ management.  Patient's Eliquis was held.  She was treated with Protonix 40mg  BID.  Her CBC was monitored closely.  Ferritin was obtained and was noted to be low at 8.  Iron replacement was held until evaluated by GI in anticipation that an EGD/Colonoscopy would likely be performed.  Patient endorsed melena during hospitalization.  EGD showed gastric antral vascular ectasia which was ablated.   Colonoscopy showed polyps, which were snared, and diverticulosis.  They recommended that she continue to be monitored closely in the outpatient setting, with 3-4 times yearly CBC and continued GI visits.  After EGD/Colonoscopy were complete, patient was treated with Fereheme IV and discharged with Ferrous sulfate to continue taking in the outpatient setting.    At discharge, patient was no longer symptomatic.  Her hgb was 9.7.  Patient was discharged in stable condition.  Discharge instructions and return precautions were reviewed with patient, who voiced good understanding.  She was instructed to resume her Eliquis on Sunday 01/18/15 and encouraged to follow up with her PCP in the next week.  Issues for Follow Up:  1. Will need CBC 3-4x/year, esp while on anticoagulation.  2. Will need hemoglobin checked the week after discharge to ensure this is stable. 3. IV Iron given in hospital.  Recommend continued iron supplementation outpatient 4. Would consider checking anemia panel.  This was not done inpatient 2/2 patient having already been transfused with pRBCs in ED. 5. Will need f/u with GI provider for continued monitoring 6. Eliquis to be resumed Sun 6/26  Significant Procedures: EGD, colonoscopy, pRBC transfusion (2 units)  Significant Labs and Imaging:   Recent Labs Lab 01/15/15 0426 01/16/15 0429 01/17/15 0442  WBC 4.5 5.4 7.1  HGB 9.4* 10.1* 9.7*  HCT 29.2* 31.2* 30.6*  PLT 212 231 224    Recent Labs Lab 01/13/15 1322 01/14/15 0125 01/16/15 0429 01/17/15 0442  NA 136 139 138 139  K 4.5 4.5 3.8 4.0  CL 109 110 105 106  CO2 22 22 21* 23  GLUCOSE 109* 90 85 92  BUN 22* 19 8 5*  CREATININE 1.09* 0.95 0.85 0.81  CALCIUM 9.1 9.1 9.5 9.7   Dg Chest 2 View  01/13/2015   CLINICAL DATA:  Not eating well or feeling well. Low hemoglobin and hematocrit  EXAM: CHEST  2 VIEW  COMPARISON:  02/07/2014.  FINDINGS: The heart size appears normal. Moderate hiatal hernia noted. There is no  pleural effusion or edema. The lungs are hyperinflated and there are coarsened interstitial markings bilaterally. Aortic atherosclerosis noted.  IMPRESSION: 1. Chronic changes of COPD. 2. Hiatal hernia 3. Aortic atherosclerosis.   Electronically Signed   By: Kerby Moors M.D.   On: 01/13/2015 15:24   Endoscopy 6/24: ENDOSCOPIC IMPRESSION: 1) Several areas that look like antral vascular ectasiia in the antru, Some scanty fresh blood was seen. i ablated with APC on gastric setting. Prior biopsy suggested gastropathy but clinical scenario suggests she has been bleeding from this. 2) 5 cm hiatal hernia 3) Otherwise normal - I did not see obvious Barrett's today RECOMMENDATIONS: Stay off Eliquis today and tomorrow then restart See GI for repeat ablation if needed Needs regular CBC from PCP or other MD's - needs iron repletion documented and then regular CBC 3-4 x a year - especially when on anti-coag Tx.  Colonoscopy 6/24: COLON FINDINGS:  1) 2 diminutive erythematous sigmoid polyps removed cold snare 2) Sigmoid diverticuolosis 3) Otherwise normal good prep. Retroflexed views revealed no abnormalities. The time to cecum = 5.7 Withdrawal time = 10.8 The scope was withdrawn and the procedure completed. COMPLICATIONS: There were no immediate complications. ENDOSCOPIC IMPRESSION: 1) 2 diminutive erythematous sigmoid polyps removed cold snare 2) Sigmoid diverticuolosis 3) Otherwise normal good prep  Results/Tests Pending at Time of Discharge: none  Discharge Medications:    Medication List    STOP taking these medications        pseudoephedrine-guaifenesin 60-600 MG per tablet  Commonly known as:  MUCINEX D      TAKE these medications        ALPRAZolam 1 MG tablet  Commonly known as:  XANAX  Take 1 mg by mouth at bedtime.     apixaban 5 MG Tabs tablet  Commonly known as:  ELIQUIS  Take 1 tablet (5 mg total) by mouth 2 (two) times daily.     bisacodyl 10 MG suppository   Commonly known as:  DULCOLAX  Place 1 suppository (10 mg total) rectally daily as needed for moderate constipation.     CALTRATE 600+D 600-400 MG-UNIT per tablet  Generic drug:  Calcium Carbonate-Vitamin D  Take 1 tablet by mouth daily.     DSS 100 MG Caps  Take 100 mg by mouth 2 (two) times daily.     famotidine 40 MG tablet  Commonly known as:  PEPCID  Take 1 tablet (40 mg total) by mouth daily at 12 noon.     ferrous sulfate 325 (65 FE) MG tablet  Commonly known as:  FERROUSUL  Take 1 tablet (325 mg total) by mouth 2 (two) times daily with a meal.     FISH OIL PO  Take 1 capsule by mouth 2 (two) times daily.     HYDROcodone-acetaminophen 5-325 MG per tablet  Commonly known as:  NORCO/VICODIN  Take 1 tablet by mouth every 6 (six) hours as needed for moderate pain.     levothyroxine 88 MCG tablet  Commonly known as:  SYNTHROID, LEVOTHROID  TAKE ONE TABLET BY MOUTH EVERY MORNING     MAGNESIUM PO  Take 1 tablet by mouth 2 (two) times daily.     metoprolol succinate 50 MG 24 hr tablet  Commonly known as:  TOPROL-XL  Take 50 mg by mouth daily.     NEURONTIN 300 MG capsule  Generic drug:  gabapentin  Take 600 mg by mouth at bedtime.     NEXIUM 40 MG capsule  Generic drug:  esomeprazole  Take 40 mg by mouth 2 (two) times daily before a meal.     ondansetron 4 MG tablet  Commonly known as:  ZOFRAN  Take 1 tablet (4 mg total) by mouth once.     polyethylene glycol packet  Commonly known as:  MIRALAX / GLYCOLAX  Take 17 g by mouth 2 (two) times daily.     simvastatin 10 MG tablet  Commonly known as:  ZOCOR  Take 10 mg by mouth daily at 6 PM.     sulfamethoxazole-trimethoprim 400-80 MG per tablet  Commonly known as:  BACTRIM,SEPTRA  Take by mouth daily.        Discharge Instructions: Please refer to Patient Instructions section of EMR for full details.  Patient was counseled important signs and symptoms that should prompt return to medical care, changes in  medications, dietary instructions, activity restrictions, and follow up appointments.   Follow-Up Appointments: Follow-up Information    Follow up with FULBRIGHT, VIRGINIA E, PA-C. Schedule an appointment as soon as possible for a visit in 1 week.   Specialty:  Family Medicine   Why:  Hospital follow up   Contact information:   [address] [place] [address] [phone]       Leone Haven, MD 01/17/2015, 9:35 AM PGY-3, [place]

## 2015-01-16 NOTE — Progress Notes (Signed)
Family Medicine Teaching Service Daily Progress Note Intern Pager: [phone]  Patient name: Carolyn [name] Medical record number: [id number] Date of birth: 1930-08-02 Age: 81 y.o. Gender: female  Primary Care Provider: Elisabeth Cara, PA-C Consultants: GI Code Status: FULL  Pt Overview and Major Events to Date:  06/21: Admitted to SDU 06/22: to telemetryu floor  Assessment and Plan: Carolyn [name] is a 81 y.o. female presenting with gradual worsening DOE and generalized weakness x 2-3 weeks, with significant worsening in past 1 week. PMH is significant for H/o L-LE DVT (09/2014) currently on anticoagulation therapy (Eliquis), mod-severe diverticulosis, GERD with esophagitis (dx Barrett's), h/o gastritis, s/p Left-hip arthroplasty for frx, HTN, HLD, Hypothyroidism, Anxiety, Depression, former smoker.  # Symptomatic Anemia, suspected subacute on chronic Blood Loss Anemia, with suspected GIB Chronic h/o anemia Hgb b/l 10-11, recent acute drop in Hgb 09/2014 with DVT since improved, now again with acute vs subacute dec Hgb. MCV 88.2, normocytic, inc RDW, no recent iron studies or ferritin. Suspect worsening anemia is etiology of symptoms, presumed due to GIB unclear source, FOBT (+ in ED), without prior h/o GIB. Known diverticulosis on colonoscopy (2007) but clinically less likely without BRBPR, h/o gastritis w/o PUD, however on eliquis anticoag may have AVM or occult GIB. Cannot rule out colon CA. No known CAD, without CP. Hgb 7.0>9.7>>10.1. Trop neg x3 EKG NSR, nonischemic. Ferritin 8 - S/p 2u PRBC transfusion - Monitor hgb daily  - Eliquis held.  Question is whether to resume at all.  Discussed risk vs benefits of prolonged anticoagulation in the setting of GI history and bleed.  Patient unsure at this time and would like to discuss with daughters. - Consider checking additional anemia panel in future as indicated, Will not do here since s/p transfusion - Protonix 40mg  q 12 hr - c/s  [facility], appreciate recs: ?gastric antral vascular ectasia.  May need ablation if so.  Scheduled for EGD/ colonoscopy today at 11am.  # GERD with esophagitis, gastritis.  Followed by [facility] (Dr. Olevia Perches), last EGD 08/2014 for h/o GERD s/p Nissen fundoplication with results of esophagitis (bx Barrett's), gastritis, gastroparesis, with negative bx for malignancy - PPI as above - F/u GI recs, consider endoscopy  # Recent L-LE DVT, on anticoagulation therapy.  History of 1st DVT 09/2014, following < 8 months after L-hip arthroplasty (01/2014), presumed provoked. Previously plan to be on eliquis x 6 months. Concerns with fall risk. - Hold eliquis today 6/21, if stable anticipate resuming pending GI recs.    # HTN. BP stable - Hold Metoprolol-XL on admit, will resume today since hgb stable   # HLD - Continue home Crestor 10mg  daily  # Hypothyroidism.  TSH 14.081. FT4 1.02 - Recheck TSH out patient. - Continue home Levothyroxine 34mcg daily  # OA, bilateral knees, h/o hip fracture.  Currently stable without acute arthritis flare or pain - Hold home Vicodin - Continue home Gabapentin 600mg  qhs - Tylenol PRN  # Anxiety, Depression - Continue home Xanax 0.5mg  qhs (does take most nights for sleep/anxiety) - No longer on celexa  FEN/GI: NPO (sips, chips, meds) pending endoscopy/colonoscopy Prophylaxis: SCDs, holding therapeutic anticoagulation (Eliquis)  Disposition: Discharge pending GI evaluation and improvement in symptoms  Subjective:  Patient reports that she is feeling well this morning.  She continues to be able to get out of bed and have no dizziness.  Daughters came by to see her yesterday.  She reports several episodes of diarrhea, esp after the colon prep. Still  melena. Denies, CP, SOB, dizziness, vision changes, hematochezia.    Objective: Temp:  [97.6 F (36.4 C)-98.6 F (37 C)] 97.6 F (36.4 C) (06/24 0433) Pulse Rate:  [63-91] 78 (06/24 0433) Resp:  [18-19] 18 (06/24  0433) BP: (126-182)/(67-105) 143/78 mmHg (06/24 0433) SpO2:  [97 %-100 %] 99 % (06/24 0433) Physical Exam: General: awake, alert, well appearing, lying in bed, NAD HEENT: [place]/AT, EOMI, dentures Cardiovascular: RRR, no murmurs, +2 radial pulse Respiratory: CTAB, no increased WOB Abdomen: soft, NT/ND, +BS Extremities: WWP, no edema  Laboratory:  Recent Labs Lab 01/14/15 0125 01/15/15 0426 01/16/15 0429  WBC 5.3 4.5 5.4  HGB 9.7* 9.4* 10.1*  HCT 30.1* 29.2* 31.2*  PLT 212 212 231    Recent Labs Lab 01/13/15 1322 01/14/15 0125  NA 136 139  K 4.5 4.5  CL 109 110  CO2 22 22  BUN 22* 19  CREATININE 1.09* 0.95  CALCIUM 9.1 9.1  GLUCOSE 109* 90   Cardiac Panel (last 3 results)  Recent Labs  01/14/15 0125 01/14/15 0825  TROPONINI <0.03 <0.03   Imaging/Diagnostic Tests: No results found. Janora Norlander, DO 01/16/2015, 6:54 AM PGY-1, [place] Intern pager: [phone], text pages welcome

## 2015-01-16 NOTE — Op Note (Signed)
[facility] [place], [id number]   ENDOSCOPY PROCEDURE REPORT  PATIENT: Carolyn, [name]  MR#: [id number] BIRTHDATE: [birth year] , 83  yrs. old GENDER: female ENDOSCOPIST: Gatha Mayer, MD, [facility] REFERRED BY: PROCEDURE DATE:  01/16/2015 PROCEDURE:  EGD w/ ablation ASA CLASS:     Class III INDICATIONS:  heme+ and melena by hx, anemic and on Eliquis. MEDICATIONS: Monitored anesthesia care, Residual sedation present, and Per Anesthesia TOPICAL ANESTHETIC: none  DESCRIPTION OF PROCEDURE: After the risks benefits and alternatives of the procedure were thoroughly explained, informed consent was obtained.  The Pentax Gastroscope [id number] endoscope was introduced through the mouth and advanced to the second portion of the duodenum , Without limitations.  The instrument was slowly withdrawn as the mucosa was fully examined.  1) Several areas that look like antral vascular ectasiia in the antru, Some scanty fresh blood was seen.  i ablated with APC on gastric setting.  Prior biopsy suggested gastropathy but clinical scenario suggests she has been bleeding from this. 2) 5 cm hiatal hernia 3) Otherwise normal - I did not see obvious Barrett's today. Retroflexed views revealed a hiatal hernia.     The scope was then withdrawn from the patient and the procedure completed.  COMPLICATIONS: There were no immediate complications.  ENDOSCOPIC IMPRESSION: 1) Several areas that look like antral vascular ectasiia in the antru, Some scanty fresh blood was seen.  i ablated with APC on gastric setting.  Prior biopsy suggested gastropathy but clinical scenario suggests she has been bleeding from this. 2) 5 cm hiatal hernia 3) Otherwise normal - I did not see obvious Barrett's today  RECOMMENDATIONS: Stay off Eliquis today and tomorrow then restart See GI for repeat ablation if needed Needs regular CBC from PCP or other MD's - needs iron  repletion documented and then regular CBC3-4 x a year - especially when on anti-coag Tx   eSigned:  Gatha Mayer, MD, [facility] 01/16/2015 11:01 AM    BF:XOVANVB Fulbright PA-C

## 2015-01-16 NOTE — Anesthesia Postprocedure Evaluation (Signed)
  Anesthesia Post-op Note  Patient: Carolyn [name]  Procedure(s) Performed: Procedure(s): COLONOSCOPY (N/A) ESOPHAGOGASTRODUODENOSCOPY (EGD) (N/A)  Patient Location: PACU  Anesthesia Type:MAC  Level of Consciousness: awake, alert  and oriented  Airway and Oxygen Therapy: Patient Spontanous Breathing  Post-op Pain: none  Post-op Assessment: Post-op Vital signs reviewed LLE Motor Response: Purposeful movement   RLE Motor Response: Purposeful movement        Post-op Vital Signs: Reviewed  Last Vitals:  Filed Vitals:   01/16/15 1105  BP: 148/88  Pulse: 79  Temp:   Resp: 16    Complications: No apparent anesthesia complications

## 2015-01-17 LAB — CBC
HCT: 30.6 % — ABNORMAL LOW (ref 36.0–46.0)
Hemoglobin: 9.7 g/dL — ABNORMAL LOW (ref 12.0–15.0)
MCH: 27.7 pg (ref 26.0–34.0)
MCHC: 31.7 g/dL (ref 30.0–36.0)
MCV: 87.4 fL (ref 78.0–100.0)
Platelets: 224 10*3/uL (ref 150–400)
RBC: 3.5 MIL/uL — ABNORMAL LOW (ref 3.87–5.11)
RDW: 14.9 % (ref 11.5–15.5)
WBC: 7.1 10*3/uL (ref 4.0–10.5)

## 2015-01-17 LAB — BASIC METABOLIC PANEL
Anion gap: 10 (ref 5–15)
BUN: 5 mg/dL — ABNORMAL LOW (ref 6–20)
CHLORIDE: 106 mmol/L (ref 101–111)
CO2: 23 mmol/L (ref 22–32)
CREATININE: 0.81 mg/dL (ref 0.44–1.00)
Calcium: 9.7 mg/dL (ref 8.9–10.3)
GFR calc non Af Amer: >60 mL/min (ref >60)
Glucose, Bld: 92 mg/dL (ref 65–99)
Potassium: 4 mmol/L (ref 3.5–5.1)
Sodium: 139 mmol/L (ref 135–145)

## 2015-01-17 NOTE — Progress Notes (Signed)
01/17/2015 3:16 PM Discharge AVS meds taken today and those due this evening reviewed.  Follow-up appointments and when to call md reviewed.  D/C IV and TELE.  Questions and concerns addressed.   D/C home per orders. [place]

## 2015-01-19 ENCOUNTER — Encounter (HOSPITAL_COMMUNITY): Payer: Self-pay | Admitting: Internal Medicine

## 2015-01-22 ENCOUNTER — Encounter (HOSPITAL_COMMUNITY): Payer: Self-pay | Admitting: Internal Medicine

## 2015-01-22 ENCOUNTER — Encounter: Payer: Self-pay | Admitting: Internal Medicine

## 2015-01-22 NOTE — Progress Notes (Signed)
Quick Note:  2 adenomas - no recall - age ______

## 2015-08-20 ENCOUNTER — Telehealth: Payer: Self-pay | Admitting: Internal Medicine

## 2015-08-20 NOTE — Telephone Encounter (Signed)
Received records from [facility] for appointment on 09/08/15 with Dr Debara Pickett.  Records given to [facility] (medical records) for Dr Lysbeth Penner schedule on 09/08/15.  lp

## 2015-09-08 ENCOUNTER — Ambulatory Visit (INDEPENDENT_AMBULATORY_CARE_PROVIDER_SITE_OTHER): Payer: Medicare Other | Admitting: Internal Medicine

## 2015-09-08 ENCOUNTER — Encounter: Payer: Self-pay | Admitting: Internal Medicine

## 2015-09-08 VITALS — BP 146/84 | HR 60 | Ht 69.0 in | Wt 167.0 lb

## 2015-09-08 DIAGNOSIS — R531 Weakness: Secondary | ICD-10-CM | POA: Diagnosis not present

## 2015-09-08 DIAGNOSIS — R011 Cardiac murmur, unspecified: Secondary | ICD-10-CM

## 2015-09-08 DIAGNOSIS — I10 Essential (primary) hypertension: Secondary | ICD-10-CM | POA: Diagnosis not present

## 2015-09-08 DIAGNOSIS — I491 Atrial premature depolarization: Secondary | ICD-10-CM

## 2015-09-08 NOTE — Patient Instructions (Signed)
Your physician has requested that you have an echocardiogram @ [address] - [address]. Echocardiography is a painless test that uses sound waves to create images of your heart. It provides your doctor with information about the size and shape of your heart and how well your heart's chambers and valves are working. This procedure takes approximately one hour. There are no restrictions for this procedure.  Your physician recommends that you schedule a follow-up appointment with Dr. Hilty after your test.   

## 2015-09-09 ENCOUNTER — Encounter: Payer: Self-pay | Admitting: *Deleted

## 2015-09-09 DIAGNOSIS — I491 Atrial premature depolarization: Secondary | ICD-10-CM | POA: Insufficient documentation

## 2015-09-09 NOTE — Progress Notes (Signed)
OFFICE NOTE  Chief Complaint:  PACs  Primary Care Physician: Elisabeth Cara, PA-C  HPI:  Carolyn [name] there is a pleasant 82 year old female is currently referred to me for evaluation of PACs which were noted on a recent monitor. She's also had some arrhythmia which was noted on physical exam. She denies any palpitations or chest pain. It turns out her daughter, who is accompanying her, has a history of atrial fibrillation and has had 2 ablations. [name] also has a history of hypertension and recent unprovoked DVT which occurred more than a year after hip surgery. She underwent treatment with Eliquis but was taken off of that after 6 months. She does have some neuropathy as well and is on Neurontin. This is not associated with diabetes. She denies any chest pain or worsening shortness of breath. She's here for evaluation of PACs which were noted on my review of her EKGs from her primary care provider.  PMHx:  Past Medical History  Diagnosis Date  . Hypertension   . High cholesterol   . Hypothyroidism   . Asthma     "only when I smoked"  . Hiatal hernia     s/p gastropexy after failed Nissen fundoplication.   . Barrett's esophagus since at least 1985  . Arthritis 11/2013.     "both knees" (11/26/2013)  . Depression with anxiety 11/2013    "little bit"   . COPD (chronic obstructive pulmonary disease) ([place])   . Pneumonia 11/2013  . Diverticulosis of colon 1996  . CKD (chronic kidney disease)     GFR 55 11/2014   . GAVE (gastric antral vascular ectasia) 01/16/2015  . Hx of adenomatous colonic polyps     Past Surgical History  Procedure Laterality Date  . Nissen fundoplication      this ultimately failed and she underwent gastropexy  . Cholecystectomy    . Bladder suspension      .  bladder tack x 3.   . Abdominal hysterectomy    . Cataract extraction w/ intraocular lens  implant, bilateral Bilateral   . Appendectomy    . Hip arthroplasty Left 02/07/2014   Procedure: LEFT HIP PRESS FIT MONOPOLAR HEMIARTHROPLASTY ;  Surgeon: Marybelle Killings, MD;  Location: [facility];  Service: Orthopedics;  Laterality: Left;  . Esophagogastroduodenoscopy N/A 09/01/2014    Procedure: ESOPHAGOGASTRODUODENOSCOPY (EGD);  Surgeon: Lafayette Dragon, MD;  Location: [facility];  Service: Endoscopy;  Laterality: N/A;  . Gastropexy  1989  . Enterocele repair  2003    with colopexy.   . Colonoscopy N/A 01/16/2015    Procedure: COLONOSCOPY;  Surgeon: Gatha Mayer, MD;  Location: [facility];  Service: Endoscopy;  Laterality: N/A;  . Esophagogastroduodenoscopy N/A 01/16/2015    Procedure: ESOPHAGOGASTRODUODENOSCOPY (EGD);  Surgeon: Gatha Mayer, MD;  Location: [facility] ENDOSCOPY;  Service: Endoscopy;  Laterality: N/A;    FAMHx:  Family History  Problem Relation Age of Onset  . Stomach cancer Mother   . Heart disease Father   . Heart disease Brother   . Bladder Cancer Sister     SOCHx:   reports that she has quit smoking. Her smoking use included Cigarettes. She has a 10 pack-year smoking history. She has never used smokeless tobacco. She reports that she does not drink alcohol or use illicit drugs.  ALLERGIES:  Allergies  Allergen Reactions  . Penicillins Hives    ROS: Pertinent items noted in HPI and remainder of comprehensive ROS otherwise negative.  HOME  MEDS: Current Outpatient Prescriptions  Medication Sig Dispense Refill  . ALPRAZolam (XANAX) 1 MG tablet Take 1 mg by mouth at bedtime.     . bisacodyl (DULCOLAX) 10 MG suppository Place 1 suppository (10 mg total) rectally daily as needed for moderate constipation. 20 suppository 0  . Calcium Carbonate-Vitamin D (CALTRATE 600+D) 600-400 MG-UNIT per tablet Take 1 tablet by mouth daily.    Marland Kitchen docusate sodium 100 MG CAPS Take 100 mg by mouth 2 (two) times daily. (Patient taking differently: Take 100 mg by mouth 2 (two) times daily as needed. ) 25 capsule 0  . famotidine (PEPCID) 40 MG tablet Take 1 tablet (40 mg total) by  mouth daily at 12 noon. 90 tablet 3  . ferrous sulfate (FERROUSUL) 325 (65 FE) MG tablet Take 1 tablet (325 mg total) by mouth 2 (two) times daily with a meal. 60 tablet 3  . levothyroxine (SYNTHROID, LEVOTHROID) 88 MCG tablet TAKE ONE TABLET BY MOUTH EVERY MORNING    . MAGNESIUM PO Take 1 tablet by mouth daily.     . metoprolol succinate (TOPROL-XL) 50 MG 24 hr tablet Take 50 mg by mouth daily.     Marland Kitchen NEXIUM 40 MG capsule Take 40 mg by mouth 2 (two) times daily before a meal.     . Omega-3 Fatty Acids (FISH OIL PO) Take 1 capsule by mouth 2 (two) times daily.     . ondansetron (ZOFRAN) 4 MG tablet Take 1 tablet (4 mg total) by mouth once. (Patient taking differently: Take 4 mg by mouth every 8 (eight) hours as needed for nausea or vomiting. ) 30 tablet 0  . polyethylene glycol (MIRALAX / GLYCOLAX) packet Take 17 g by mouth 2 (two) times daily. (Patient taking differently: Take 17 g by mouth daily as needed. ) 14 each 0  . simvastatin (ZOCOR) 10 MG tablet Take 10 mg by mouth daily at 6 PM.     . sulfamethoxazole-trimethoprim (BACTRIM,SEPTRA) 400-80 MG per tablet Take by mouth daily.    Marland Kitchen gabapentin (NEURONTIN) 300 MG capsule Take 600 mg by mouth at bedtime.      No current facility-administered medications for this visit.    LABS/IMAGING: No results found for this or any previous visit (from the past 48 hour(s)). No results found.  WEIGHTS: Wt Readings from Last 3 Encounters:  09/08/15 167 lb (75.751 kg)  01/13/15 159 lb (72.122 kg)  10/02/14 163 lb (73.936 kg)    VITALS: BP 146/84 mmHg  Pulse 60  Ht 5\' 9"  (1.753 m)  Wt 167 lb (75.751 kg)  BMI 24.65 kg/m2  EXAM: General appearance: alert and no distress Neck: no carotid bruit, no JVD and thyroid not enlarged, symmetric, no tenderness/mass/nodules Lungs: clear to auscultation bilaterally Heart: regular rate and rhythm, S1, S2 normal, no murmur, click, rub or gallop Abdomen: soft, non-tender; bowel sounds normal; no masses,  no  organomegaly Extremities: extremities normal, atraumatic, no cyanosis or edema Pulses: 2+ and symmetric Skin: Skin color, texture, turgor normal. No rashes or lesions Neurologic: Grossly normal psych: Pleasant  EKG: Sinus rhythm at 60  ASSESSMENT: 1. PACs 2. Hypertension 3. Recent DVT  PLAN: 1.   Ms. Causer is referred for evaluation of irregular heartbeat and PACs which were noted on an EKG. I do not see any evidence of atrial fibrillation. My suspicion, however his it is possible she could be having paroxysmal atrial fibrillation and be asymptomatic with it. Her daughter has A. Fib and there is clearly a genetic  link in some patients. She is on a beta blocker at a reasonable dose, but continues to have some breakthrough PACs which she is not symptomatic with. I like to get an echocardiogram to look for any structural causes of her PACs and assess for left atrial enlargement. If there is significant left atrial enlargement, we may wish to monitor her for a period of time to see if she's having any occult atrial fibrillation, which would change her management with regards to anticoagulation. Otherwise I don't think any further workup is necessary at this time.  Plan to see her back to discuss results of the echo. Thanks again for the kind referral.  Pixie Casino, MD, [facility] Attending Cardiologist [place] 09/09/2015, 8:36 AM

## 2015-09-21 ENCOUNTER — Other Ambulatory Visit: Payer: Self-pay

## 2015-09-21 ENCOUNTER — Ambulatory Visit (HOSPITAL_COMMUNITY): Payer: Medicare Other | Attending: Internal Medicine

## 2015-09-21 DIAGNOSIS — I491 Atrial premature depolarization: Secondary | ICD-10-CM | POA: Diagnosis not present

## 2015-09-21 DIAGNOSIS — I351 Nonrheumatic aortic (valve) insufficiency: Secondary | ICD-10-CM | POA: Insufficient documentation

## 2015-09-21 DIAGNOSIS — Z8249 Family history of ischemic heart disease and other diseases of the circulatory system: Secondary | ICD-10-CM | POA: Diagnosis not present

## 2015-09-21 DIAGNOSIS — I1310 Hypertensive heart and chronic kidney disease without heart failure, with stage 1 through stage 4 chronic kidney disease, or unspecified chronic kidney disease: Secondary | ICD-10-CM | POA: Diagnosis not present

## 2015-09-21 DIAGNOSIS — E785 Hyperlipidemia, unspecified: Secondary | ICD-10-CM | POA: Diagnosis not present

## 2015-09-21 DIAGNOSIS — N189 Chronic kidney disease, unspecified: Secondary | ICD-10-CM | POA: Diagnosis not present

## 2015-09-21 DIAGNOSIS — Z87891 Personal history of nicotine dependence: Secondary | ICD-10-CM | POA: Insufficient documentation

## 2015-09-21 DIAGNOSIS — R011 Cardiac murmur, unspecified: Secondary | ICD-10-CM | POA: Diagnosis not present

## 2015-10-19 ENCOUNTER — Ambulatory Visit (INDEPENDENT_AMBULATORY_CARE_PROVIDER_SITE_OTHER): Payer: Medicare Other | Admitting: Internal Medicine

## 2015-10-19 ENCOUNTER — Encounter: Payer: Self-pay | Admitting: Internal Medicine

## 2015-10-19 VITALS — BP 160/92 | HR 77 | Ht 69.0 in | Wt 170.4 lb

## 2015-10-19 DIAGNOSIS — I351 Nonrheumatic aortic (valve) insufficiency: Secondary | ICD-10-CM | POA: Diagnosis not present

## 2015-10-19 DIAGNOSIS — I491 Atrial premature depolarization: Secondary | ICD-10-CM | POA: Diagnosis not present

## 2015-10-19 DIAGNOSIS — I10 Essential (primary) hypertension: Secondary | ICD-10-CM | POA: Diagnosis not present

## 2015-10-19 NOTE — Patient Instructions (Signed)
Please record blood pressure reading for 1 week - call the office    Your physician has requested that you have an echocardiogram [address] in MARCH OF 2018. Echocardiography is a painless test that uses sound waves to create images of your heart. It provides your doctor with information about the size and shape of your heart and how well your heart's chambers and valves are working. This procedure takes approximately one hour. There are no restrictions for this procedure.   Your physician wants you to follow-up in [place] ECHO IS COMPLETE.  You will receive a reminder letter in the mail two months in advance. If you don't receive a letter, please call our office to schedule the follow-up appointment.  If you need a refill on your cardiac medications before your next appointment, please call your pharmacy.

## 2015-10-19 NOTE — Progress Notes (Signed)
OFFICE NOTE  Chief Complaint:  Follow-up echo  Primary Care Physician: Carolyn Cara, PA-C  HPI:  Carolyn [name] there is a pleasant 82 year old female is currently referred to me for evaluation of PACs which were noted on a recent monitor. She's also had some arrhythmia which was noted on physical exam. She denies any palpitations or chest pain. It turns out her daughter, who is accompanying her, has a history of atrial fibrillation and has had 2 ablations. Carolyn [name] also has a history of hypertension and recent unprovoked DVT which occurred more than a year after hip surgery. She underwent treatment with Eliquis but was taken off of that after 6 months. She does have some neuropathy as well and is on Neurontin. This is not associated with diabetes. She denies any chest pain or worsening shortness of breath. She's here for evaluation of PACs which were noted on my review of her EKGs from her primary care provider.  I saw Carolyn [name] back today with her daughter. She underwent an echocardiogram which showed normal systolic function and grade 2 diastolic dysfunction. There was mild aortic insufficiency and mild left atrial enlargement. At this point I do not see any evidence of atrial fibrillation on her EKGs and her mild left atrial enlargement is not necessarily predictive of increased risk of A. fib. She will need follow-up of her aortic insufficiency going forward. She denies being short of breath. Blood pressure was elevated today 160/90 however her daughter seem to think that this was just being stressed in the office. She agreed to take some blood pressures at home over the next week and send [place] those numbers. If her blood pressure remains elevated it may be helpful to add a low-dose diuretic such as chlorthalidone.  PMHx:  Past Medical History  Diagnosis Date  . Hypertension   . High cholesterol   . Hypothyroidism   . Asthma     "only when I smoked"  . Hiatal hernia     s/p  gastropexy after failed Nissen fundoplication.   . Barrett's esophagus since at least 1985  . Arthritis 11/2013.     "both knees" (11/26/2013)  . Depression with anxiety 11/2013    "little bit"   . COPD (chronic obstructive pulmonary disease) ([place])   . Pneumonia 11/2013  . Diverticulosis of colon 1996  . CKD (chronic kidney disease)     GFR 55 11/2014   . GAVE (gastric antral vascular ectasia) 01/16/2015  . Hx of adenomatous colonic polyps     Past Surgical History  Procedure Laterality Date  . Nissen fundoplication      this ultimately failed and she underwent gastropexy  . Cholecystectomy    . Bladder suspension      .  bladder tack x 3.   . Abdominal hysterectomy    . Cataract extraction w/ intraocular lens  implant, bilateral Bilateral   . Appendectomy    . Hip arthroplasty Left 02/07/2014    Procedure: LEFT HIP PRESS FIT MONOPOLAR HEMIARTHROPLASTY ;  Surgeon: Marybelle Killings, MD;  Location: [facility];  Service: Orthopedics;  Laterality: Left;  . Esophagogastroduodenoscopy N/A 09/01/2014    Procedure: ESOPHAGOGASTRODUODENOSCOPY (EGD);  Surgeon: Lafayette Dragon, MD;  Location: [facility];  Service: Endoscopy;  Laterality: N/A;  . Gastropexy  1989  . Enterocele repair  2003    with colopexy.   . Colonoscopy N/A 01/16/2015    Procedure: COLONOSCOPY;  Surgeon: Gatha Mayer, MD;  Location: [facility];  Service: Endoscopy;  Laterality: N/A;  . Esophagogastroduodenoscopy N/A 01/16/2015    Procedure: ESOPHAGOGASTRODUODENOSCOPY (EGD);  Surgeon: Gatha Mayer, MD;  Location: [facility] END[facility]COPY;  Service: Endoscopy;  Laterality: N/A;    FAMHx:  Family History  Problem Relation Age of Onset  . Heart disease Mother   . Heart disease Father   . Heart disease Brother   . Bladder Cancer Sister   . Heart disease Brother   . Heart disease Brother     SOCHx:   reports that she has quit smoking. Her smoking use included Cigarettes. She has a 10 pack-year smoking history. She has never used smokeless  tobacco. She reports that she does not drink alcohol or use illicit drugs.  ALLERGIES:  Allergies  Allergen Reactions  . Penicillins Hives    ROS: Pertinent items noted in HPI and remainder of comprehensive ROS otherwise negative.  HOME MEDS: Current Outpatient Prescriptions  Medication Sig Dispense Refill  . ALPRAZolam (XANAX) 1 MG tablet Take 1 mg by mouth at bedtime.     . bisacodyl (DULCOLAX) 10 MG suppository Place 1 suppository (10 mg total) rectally daily as needed for moderate constipation. 20 suppository 0  . Calcium Carbonate-Vitamin D (CALTRATE 600+D) 600-400 MG-UNIT per tablet Take 1 tablet by mouth daily.    Marland Kitchen docusate sodium 100 MG CAPS Take 100 mg by mouth 2 (two) times daily. (Patient taking differently: Take 100 mg by mouth 2 (two) times daily as needed. ) 25 capsule 0  . famotidine (PEPCID) 40 MG tablet Take 1 tablet (40 mg total) by mouth daily at 12 noon. 90 tablet 3  . ferrous sulfate (FERROUSUL) 325 (65 FE) MG tablet Take 1 tablet (325 mg total) by mouth 2 (two) times daily with a meal. 60 tablet 3  . gabapentin (NEURONTIN) 300 MG capsule Take 600 mg by mouth at bedtime.     Marland Kitchen levothyroxine (SYNTHROID, LEVOTHROID) 88 MCG tablet TAKE ONE TABLET BY MOUTH EVERY MORNING    . MAGNESIUM PO Take 1 tablet by mouth daily.     . metoprolol succinate (TOPROL-XL) 50 MG 24 hr tablet Take 50 mg by mouth daily.     Marland Kitchen NEXIUM 40 MG capsule Take 40 mg by mouth 2 (two) times daily before a meal.     . Omega-3 Fatty Acids (FISH OIL PO) Take 1 capsule by mouth 2 (two) times daily.     . ondansetron (ZOFRAN) 4 MG tablet Take 1 tablet (4 mg total) by mouth once. (Patient taking differently: Take 4 mg by mouth every 8 (eight) hours as needed for nausea or vomiting. ) 30 tablet 0  . polyethylene glycol (MIRALAX / GLYCOLAX) packet Take 17 g by mouth 2 (two) times daily. (Patient taking differently: Take 17 g by mouth daily as needed. ) 14 each 0  . simvastatin (ZOCOR) 10 MG tablet Take 10 mg  by mouth daily at 6 PM.     . sulfamethoxazole-trimethoprim (BACTRIM,SEPTRA) 400-80 MG per tablet Take by mouth daily.     No current facility-administered medications for this visit.    LABS/IMAGING: No results found for this or any previous visit (from the past 48 hour(s)). No results found.  WEIGHTS: Wt Readings from Last 3 Encounters:  10/19/15 170 lb 6.4 oz (77.293 kg)  09/08/15 167 lb (75.751 kg)  01/13/15 159 lb (72.122 kg)    VITALS: BP 160/92 mmHg  Pulse 77  Ht 5\' 9"  (1.753 m)  Wt 170 lb 6.4 oz (77.293 kg)  BMI  25.15 kg/m2  EXAM: Deferred  EKG: Defered  ASSESSMENT: 1. PACs 2. Hypertension 3. Recent DVT 4. Mild aortic insufficiency-EF 55-60% with grade 2 diastolic dysfunction ([id number])  PLAN: 1.   Ms. Gardner seems to have good control of her PACs with metoprolol. Her echo shows mild aortic insufficiency and grade 2 diastolic dysfunction with normal EF. She will need a repeat echo in one year to reassess her aortic valve regurgitation. I do not see evidence of atrial fibrillation and she is asymptomatic. Should she have an increase in frequency of PACs, it may be worthwhile to consider a monitor again.  Pixie Casino, MD, [facility] Attending Cardiologist [place] 10/19/2015, 9:59 AM

## 2015-10-28 IMAGING — RF DG UGI W/ KUB
13 of 24 series · 13 of 24 positions shown · non-contrast
Comparison: Chest radiographs 02/07/2014 and earlier.

CLINICAL DATA: 83-year-old female status post failed Nissen
fundoplication, patient reports 2 prior surgeries to address hiatal
hernia. Personal history of [place] esophagus, resolved on prior
endoscopy. Gastroesophageal reflux disease without esophagitis.
Subsequent encounter.

EXAM:
UPPER GI SERIES WITH KUB
TECHNIQUE: After obtaining a scout radiograph a routine upper GI series was
performed using thin and high density barium.
FLUOROSCOPY TIME:  Radiation Exposure Index (as provided by the
fluoroscopic device):
If the device does not provide the exposure index:
Fluoroscopy Time (in minutes and seconds):  4 minutes and 19 seconds
Number of Acquired Images:  Twenty-four

[Series 1: run · 1 of 1 slices shown (1 of 13)]
[im 1/1]
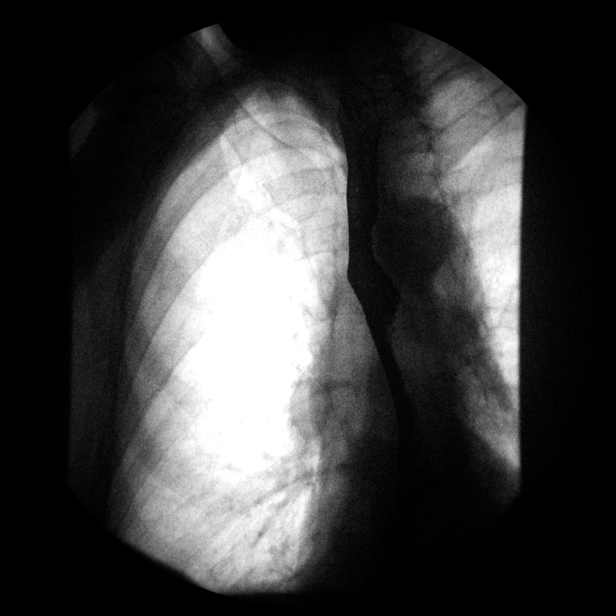

[Series 3: run · 1 of 1 slices shown (2 of 13)]
[im 1/1]
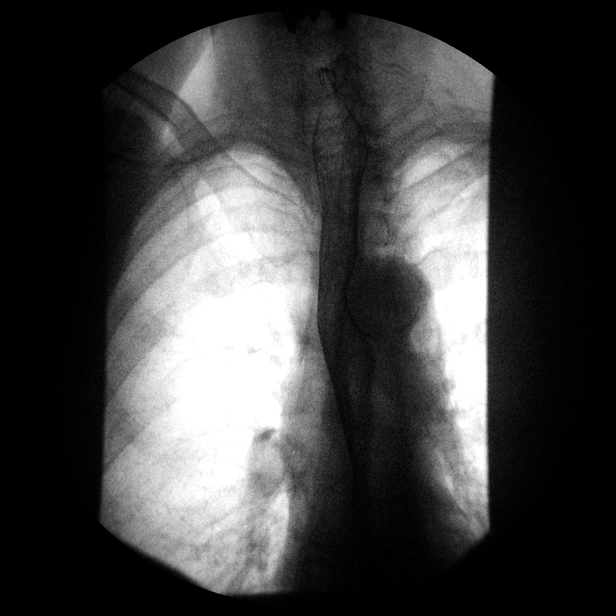

[Series 5: run · 1 of 1 slices shown (3 of 13)]
[im 1/1]
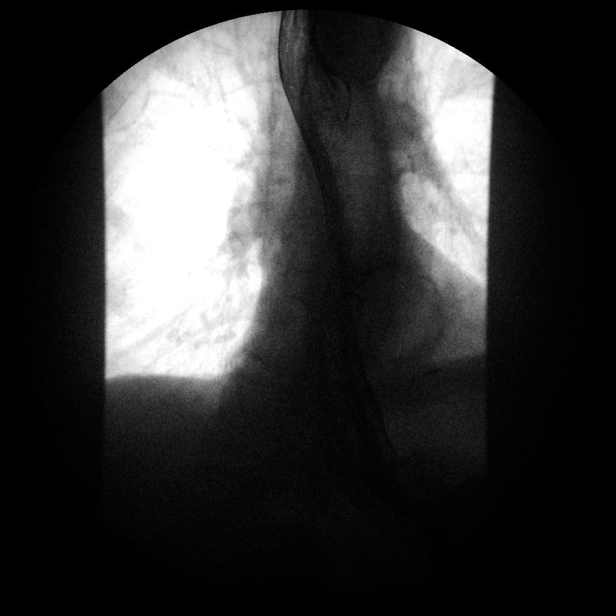

[Series 7: run · 1 of 1 slices shown (4 of 13)]
[im 1/1]
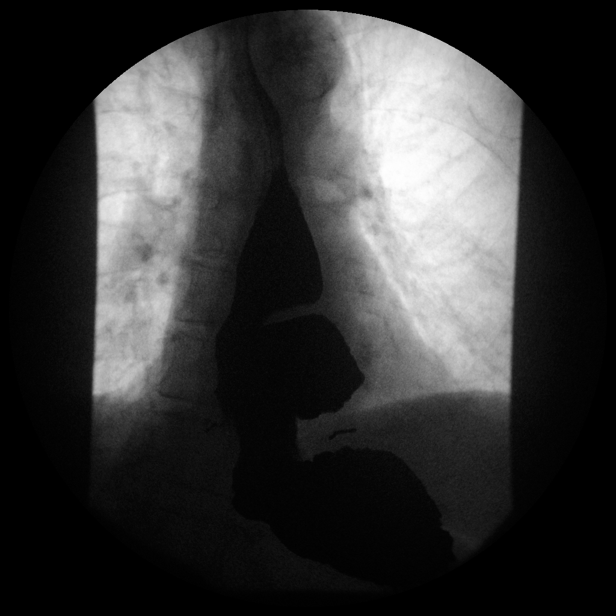

[Series 9: run · 1 of 1 slices shown (5 of 13)]
[im 1/1]
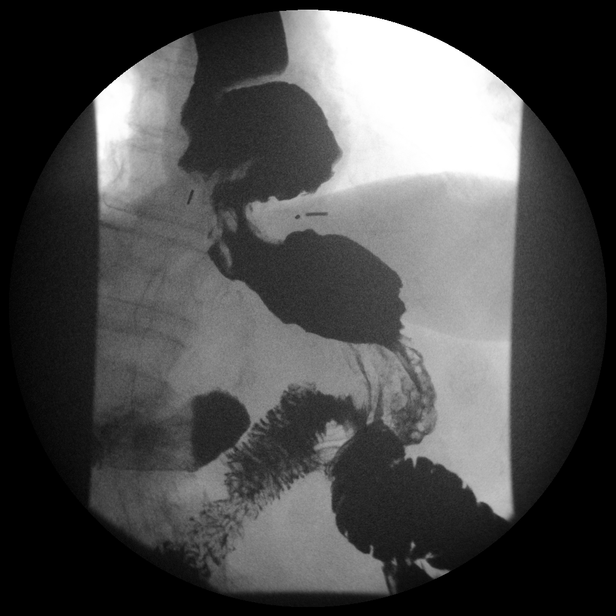

[Series 11: run · 1 of 1 slices shown (6 of 13)]
[im 1/1]
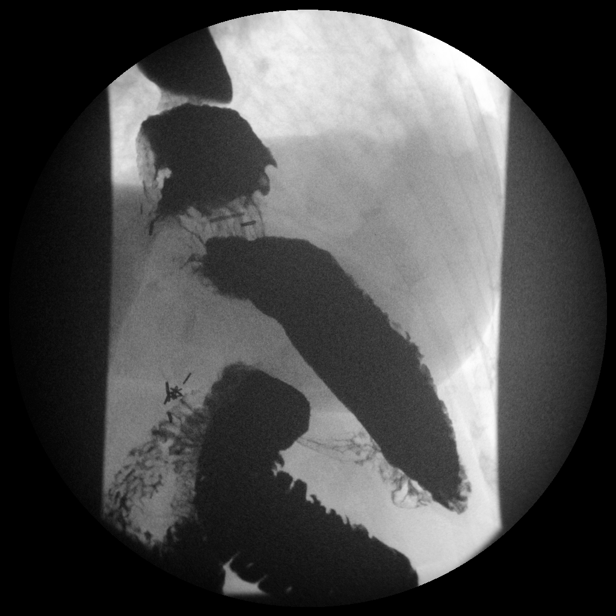

[Series 13: run · 1 of 1 slices shown (7 of 13)]
[im 1/1]
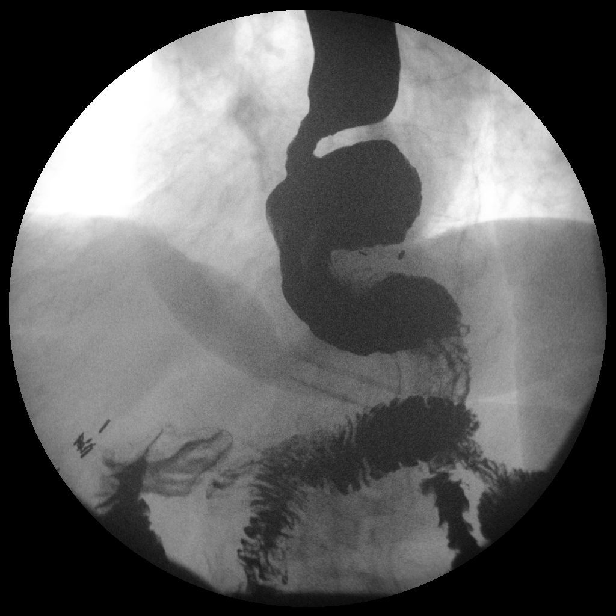

[Series 14: run · 1 of 1 slices shown (8 of 13)]
[im 1/1]
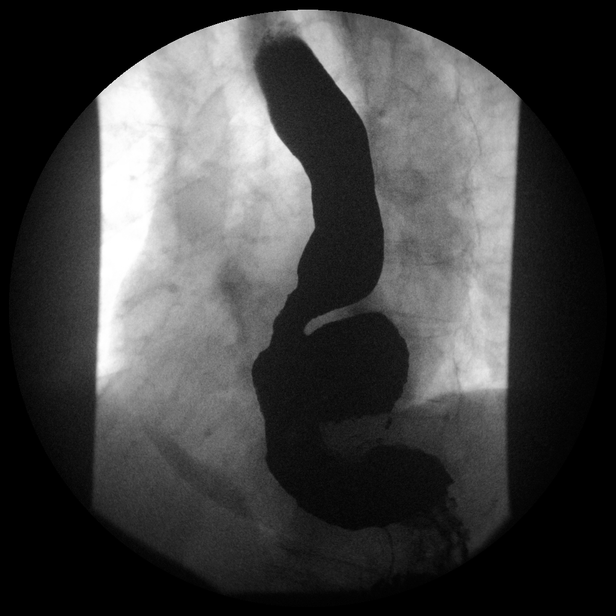

[Series 16: run · 1 of 1 slices shown (9 of 13)]
[im 1/1]
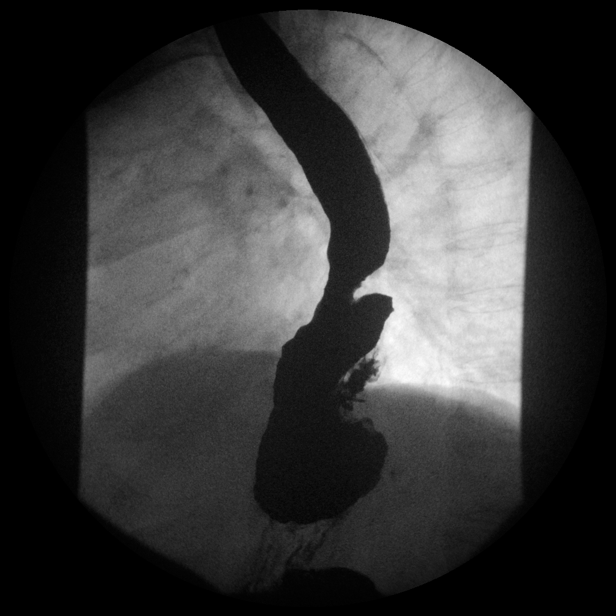

[Series 18: run · 1 of 1 slices shown (10 of 13)]
[im 1/1]
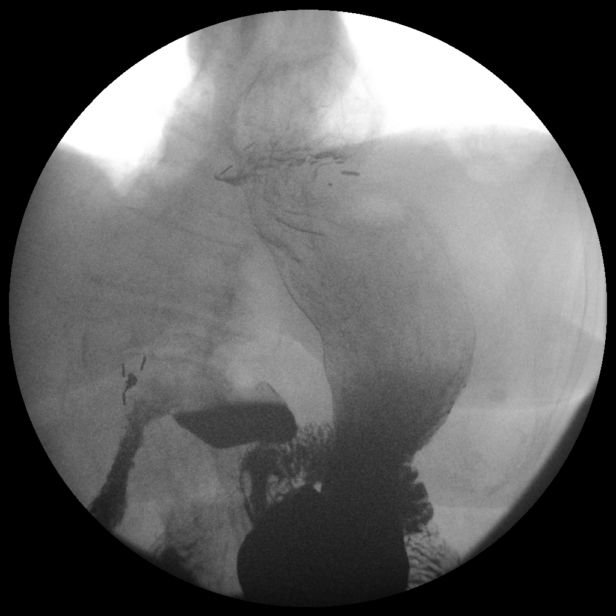

[Series 20: run · 1 of 11 slices shown (11 of 13)]
[im 1/11]
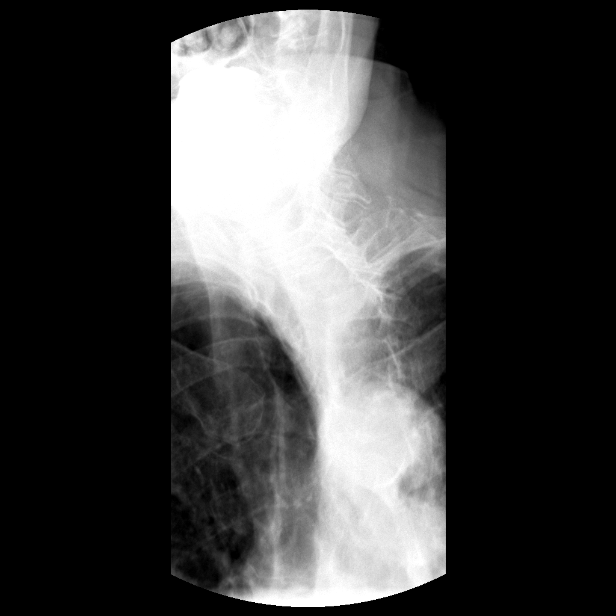

[Series 22: run · 1 of 1 slices shown (12 of 13)]
[im 1/1]
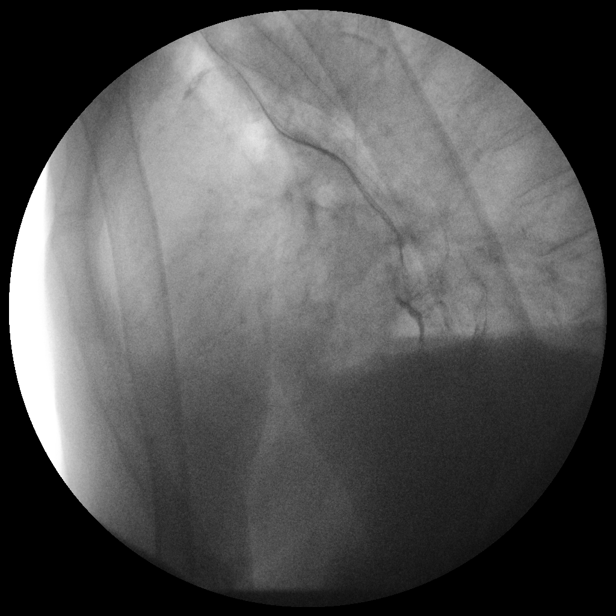

[Series 24: run · 1 of 1 slices shown (13 of 13)]
[im 1/1]
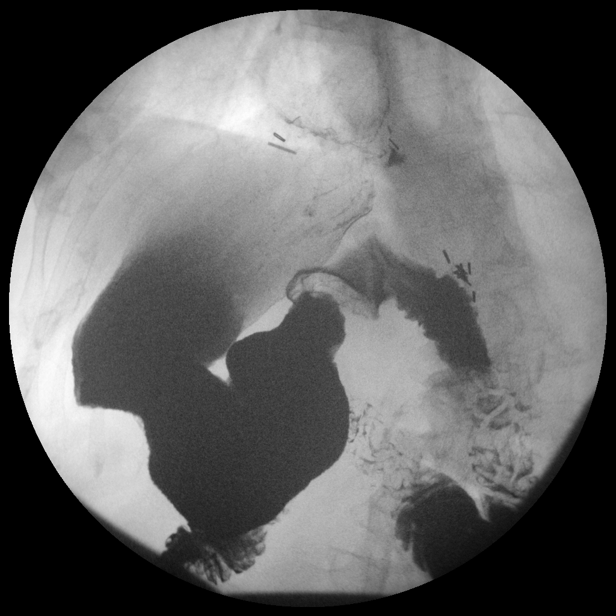

[13 of 24 positions shown; findings below may reference images not displayed]

FINDINGS: Preprocedural scout view of the abdomen. Moderate to severe lumbar
scoliosis and lumbar spine degenerative changes. Stable surgical
clips at the level of the gastroesophageal junction. Additional
cholecystectomy clips. Aortoiliac calcified atherosclerosis. Left
hip arthroplasty partially visible.

A double contrast study was undertaken and the patient tolerated
this well and without difficulty.

No obstruction to the forward flow of contrast throughout the
esophagus and into the stomach. Normal esophageal mucosal pattern.
Esophageal course and contour within normal limits except in the
lower chest, where a smooth round contour deformity was noted
initially.

A 12.5 mm barium tablet was administered and passed freely to the
stomach without delay.

Rapid sequence imaging of the cervical esophagus is within normal
limits.

When the patient was placed prone or supine large volume spontaneous
gastroesophageal reflux occurred, as well as filling of a gastric
hiatal hernia. Further evaluation with prone swallows revealed this
to be a paraesophageal type hernia, with mild to moderate extrinsic
compression on the distal thoracic esophagus (series 29 and 30).
There is also a small superimposed sliding-type gastroesophageal
hernia demonstrated. Also on prone swallows generalized lack of
esophageal motility was noted. No tertiary contractions occurred.
The hernia was filled with gas when in the right lateral or RPO
position, but spontaneously filled with contrast when left-side-down
or supine. Overall hernia size is small to moderate.

The intra abdominal stomach contour is within normal limits. Prompt
gastric emptying occurred. Gastric antrum and duodenum bulb are
within normal limits. Duodenum C loop within normal limits.
IMPRESSION: 1. Small to moderate paraesophageal type hernia, with mild extrinsic
compression on the distal thoracic esophagus. Superimposed small
sliding-type hiatal hernia.
2. Spontaneous and fairly large volume gastroesophageal reflux
whenever the patient was horizontal.
3. Presbyesophagus.
4. Negative cervical esophagus, and a 12.5 mm barium tablet passed
freely to the stomach without delay.
5. Intra abdominal stomach and duodenum are within normal limits.

## 2015-12-07 ENCOUNTER — Telehealth: Payer: Self-pay | Admitting: Family Medicine

## 2015-12-07 NOTE — Telephone Encounter (Signed)
APT. REMINDER CALL, NO ANSWER °

## 2015-12-08 ENCOUNTER — Ambulatory Visit (INDEPENDENT_AMBULATORY_CARE_PROVIDER_SITE_OTHER): Payer: Medicare Other | Admitting: Internal Medicine

## 2015-12-08 ENCOUNTER — Encounter: Payer: Self-pay | Admitting: Internal Medicine

## 2015-12-08 VITALS — BP 165/85 | HR 68 | Temp 97.5°F | Wt 166.6 lb

## 2015-12-08 DIAGNOSIS — E039 Hypothyroidism, unspecified: Secondary | ICD-10-CM

## 2015-12-08 DIAGNOSIS — Z87440 Personal history of urinary (tract) infections: Secondary | ICD-10-CM

## 2015-12-08 DIAGNOSIS — I10 Essential (primary) hypertension: Secondary | ICD-10-CM | POA: Diagnosis not present

## 2015-12-08 DIAGNOSIS — R3915 Urgency of urination: Secondary | ICD-10-CM

## 2015-12-08 DIAGNOSIS — D649 Anemia, unspecified: Secondary | ICD-10-CM

## 2015-12-08 DIAGNOSIS — G4700 Insomnia, unspecified: Secondary | ICD-10-CM

## 2015-12-08 DIAGNOSIS — N390 Urinary tract infection, site not specified: Secondary | ICD-10-CM

## 2015-12-08 DIAGNOSIS — E785 Hyperlipidemia, unspecified: Secondary | ICD-10-CM | POA: Insufficient documentation

## 2015-12-08 LAB — POCT URINALYSIS DIPSTICK
Bilirubin, UA: NEGATIVE
GLUCOSE UA: NEGATIVE
Ketones, UA: NEGATIVE
Leukocytes, UA: NEGATIVE
Nitrite, UA: NEGATIVE
Protein, UA: NEGATIVE
SPEC GRAV UA: 1.015
UROBILINOGEN UA: 0.2
pH, UA: 7

## 2015-12-08 MED ORDER — ESTROGENS, CONJUGATED 0.625 MG/GM VA CREA: 1.0000 | TOPICAL_CREAM | Freq: Every day | VAGINAL | Status: DC

## 2015-12-08 MED ORDER — ALPRAZOLAM 1 MG PO TABS: 1.0000 mg | ORAL_TABLET | Freq: Every day | ORAL | Status: DC

## 2015-12-08 NOTE — Patient Instructions (Signed)
Carolyn [name],  It was a pleasure meeting you today.  Your blood pressure is a little high. Please take Blood pressure readings at home (2-3 times a week) and bring in your blood pressure cuff to your next visit.  For your UTIs, we are prescribing a cream to apply to your vagina every day. This will reduce your risk of getting UTIs.  We are obtaining blood work for your thyroid and anemia.  We are weaning down your Xanax, as this can be harmful in elderly (young at heart) patients) You tell me that you currently take Xanax every other night, so your current monthly supply is 15.  You next monthly supply is 12, and we will go down from there. Please DO NOT stop this medication suddenly, as this can be dangerous. We have to wean down slowly.  Please follow up with [place] in a month.

## 2015-12-08 NOTE — Progress Notes (Signed)
Subjective:    Patient ID: Carolyn [name], female    DOB: 16-Dec-1930, 82 y.o.   MRN: [id number]  HPI   Carolyn [name] is an 82 year old woman with a PMH of hip fracture, previous provoked DVT (completed 3 month course of Eliquis), HTN, Gastric Ectasia s/p bleed while on Eliquis, insomnia who comes to the clinic to establish care as a new patient and discuss several ongoing medical problems. She was joined by her son in the room and her daughter over the phone to assist with the history.  She describes having recurrent UTIs, with her only symptoms being increased urinary urgency and mental status changes. Speaking with her daughter, she says, "We just saw Carolyn [name] was confused and we took her to hospital. They saw she had a UTI and gave her antibiotics. She felt better after that." Because of this, she is on Bactrim long-term. She had a recent bout of urinary urgency and was found to have a UA suggestive of a UTI at a recent outpatient visit and therefore was started on Ciprofloxacin for a 7-day course. She is taking her last pill of Cipro today. She tells me to day that her her urgency more of a chronic problem than an acute problem. She says that sometimes she cannot back it to the bathroom in time. She denies any fevers, chills, or back pain today. She denies any pain on urination.  She is prescribed Alparzolam 1.0 mg nightly; however she says 15 pills will last her about a month, as she take it "every other night." She denies any withdrawal problems from alprazolam on night she does not take it. No history of seizures. She endorses good sleep hygiene with a regular bedtime routine and no TV in the bed.  She has been adherent with her metoprolol for blood pressure, iron for her anemia, and synthroid for her hypothyroidism. She is tolerating these medications well.  She is a former smoker. She lives on her own, but her children assist her at times. She does not drink.       Review of Systems    Constitutional: Negative for fever, chills, appetite change and fatigue.  HENT: Negative for congestion and sore throat.   Eyes: Negative for visual disturbance.  Respiratory: Negative for shortness of breath and wheezing.   Cardiovascular: Negative for chest pain, palpitations and leg swelling.  Gastrointestinal: Negative for diarrhea, constipation and blood in stool.  Endocrine: Negative for cold intolerance, heat intolerance, polydipsia and polyphagia.  Genitourinary: Positive for urgency and frequency. Negative for dysuria.  Musculoskeletal: Negative for back pain and arthralgias.  Neurological: Negative for dizziness, syncope, weakness and light-headedness.  Psychiatric/Behavioral: Negative for confusion. The patient is not nervous/anxious.        Objective:   Physical Exam  Constitutional: She is oriented to person, place, and time. She appears well-developed. No distress.  HENT:  Mouth/Throat: Oropharynx is clear and moist. No oropharyngeal exudate.  Eyes: Conjunctivae are normal. No scleral icterus.  Neck: Normal range of motion. Neck supple. No thyromegaly present.  Cardiovascular: Normal rate, regular rhythm and normal heart sounds.   Pulmonary/Chest: Effort normal and breath sounds normal. No respiratory distress. She has no wheezes.  Abdominal: Soft. Bowel sounds are normal. She exhibits no distension. There is no tenderness.  No suprapubic tenderness. No CVA tenderness.  Musculoskeletal: She exhibits no edema or tenderness.  Lymphadenopathy:    She has no cervical adenopathy.  Neurological: She is alert and oriented to person, place,  and time. She has normal reflexes.  Skin: Skin is warm and dry.  Psychiatric: She has a normal mood and affect. Her behavior is normal.  Vitals reviewed.         Assessment & Plan:   Please see problem based assessment and plan for details.

## 2015-12-09 LAB — FERRITIN: Ferritin: 135 ng/mL (ref 15–150)

## 2015-12-09 LAB — TSH: TSH: 4.44 u[IU]/mL (ref 0.450–4.500)

## 2015-12-09 NOTE — Assessment & Plan Note (Signed)
A: Patient did not take her metoprolol this morning, BP elevated with SBP in the 160s, even after repeat. She indicates her blood pressures are better controlled at home. In instructed her to take her metoprolol as prescribed for this next visit, record BPs at home, and bring in her blood pressure cuff, to which she was agreeable.  P: Continue metoprolol 50 mg 24 hr tablet daily Home BP recordings

## 2015-12-09 NOTE — Assessment & Plan Note (Signed)
A: TSH high normal at 4.4 today. She denies any symptoms of hyper or hypothyroidism. She is at an appropriate synthroid dose at this time.  P: Continue 88 mcg levothyroxine daily

## 2015-12-09 NOTE — Assessment & Plan Note (Signed)
A: It is unclear to me if patient is truly having recurrent UTIs or is having overactive bladder. I do not necessarily think longterm Bactim therapy is appropriate; however, the patient and her family are fearful of discontinuing Bactrim at this time given previous hospitalizations for UTIs. Topical estrogen creams, applied vaginally, have shown efficacy in managing recurrent UTIs, and the patient is agreeable to trying it at this time. At the next visit, bring up the possibility of a trial off of Bactrim. If she continues to have urinary urgency, she may benefit from a trial of Mirabegron.  UA dipstick performed in clinic is normal. I have attempted to call the patient and family members that any previous UTI was likely cleared, but they did not respond. I left a HIPAA-compliant message.  P: Continue Bactrim 400-80 mg daily for now, please consider discontinuing at next visit Premarin cream Consider treatment for overactive bladder at next visit

## 2015-12-09 NOTE — Assessment & Plan Note (Signed)
A: Patient is agreeable to weaning off of alprazolam for her insomnia. She is comfortable with 12 pills of 1 mg alprazolam for a 1 month supply, which would be a decrease from 15 pills. She handed over her old bottle of alprazolam which was destroyed and disposed of in the presence of Flossie Dibble, PharmD. S  P:  Continue weaning her off of Xanax Month 1: 12 pills 2: 10 Pills 3: 8 pills Etc.  Explore non-pharmacologic ways to manage insomnia if this continues to be a problem

## 2015-12-10 ENCOUNTER — Telehealth: Payer: Self-pay

## 2015-12-10 LAB — URINE CULTURE

## 2015-12-10 NOTE — Progress Notes (Signed)
Case discussed with Dr. Ford at the time of the visit. We reviewed the resident's history and exam and pertinent patient test results. I agree with the assessment, diagnosis, and plan of care documented in the resident's note. 

## 2015-12-10 NOTE — Telephone Encounter (Signed)
Dr. Marijean Bravo saw her. Will forward this to him for follow up

## 2015-12-10 NOTE — Telephone Encounter (Signed)
Carolyn [name] with [facility] calls questioning why patient has presented with a new rx for alprazolam when a 90 day supply was filled in April, they need to confirm what patient is reporting which is that MD office destroyed her latest bottle.   She advises patients insurance will not pay for another rx until mid July.  Cost out of pocket is $12.  Patient will call if she has trouble paying this amount.

## 2015-12-10 NOTE — Addendum Note (Signed)
Addended by: Oval Linsey D on: 12/10/2015 07:37 PM   Modules accepted: Level of Service

## 2015-12-11 NOTE — Telephone Encounter (Signed)
Contacted [facility] and spoke to pharmacist. Issue was resolved and patient was able to pick up the new Rx w/ #12.

## 2016-01-11 ENCOUNTER — Ambulatory Visit: Payer: Medicare Other | Admitting: Internal Medicine

## 2016-01-14 ENCOUNTER — Ambulatory Visit (INDEPENDENT_AMBULATORY_CARE_PROVIDER_SITE_OTHER): Payer: Medicare Other | Admitting: Internal Medicine

## 2016-01-14 ENCOUNTER — Encounter: Payer: Self-pay | Admitting: Internal Medicine

## 2016-01-14 VITALS — BP 147/77 | HR 78 | Temp 97.6°F | Ht 68.0 in | Wt 163.9 lb

## 2016-01-14 DIAGNOSIS — G4700 Insomnia, unspecified: Secondary | ICD-10-CM

## 2016-01-14 DIAGNOSIS — Z87440 Personal history of urinary (tract) infections: Secondary | ICD-10-CM

## 2016-01-14 DIAGNOSIS — R3915 Urgency of urination: Secondary | ICD-10-CM | POA: Diagnosis not present

## 2016-01-14 DIAGNOSIS — I10 Essential (primary) hypertension: Secondary | ICD-10-CM | POA: Diagnosis not present

## 2016-01-14 DIAGNOSIS — N390 Urinary tract infection, site not specified: Secondary | ICD-10-CM

## 2016-01-14 MED ORDER — METOPROLOL SUCCINATE ER 100 MG PO TB24: 100.0000 mg | ORAL_TABLET | Freq: Every day | ORAL | Status: DC

## 2016-01-14 MED ORDER — OXYBUTYNIN CHLORIDE ER 5 MG PO TB24: 5.0000 mg | ORAL_TABLET | Freq: Every day | ORAL | Status: DC

## 2016-01-14 MED ORDER — ALPRAZOLAM 1 MG PO TABS: 1.0000 mg | ORAL_TABLET | Freq: Every day | ORAL | Status: DC

## 2016-01-14 NOTE — Assessment & Plan Note (Signed)
Is currently tapering her xanax use. Got 12 tablets last month. Plan is to go down by 2 tabs each month. Refilled her script with #10 tabs of xanax 1mg .

## 2016-01-14 NOTE — Assessment & Plan Note (Signed)
Denies any current urinary complaints. We discussed the negative sides of being on bactrim long term (adverse reactions, hyperkalemia, AKI, developing resistance, etc) when she may not truly have recurrent UTIs but rather just overactive bladder since the only symptom she gets is urinary frequency.   Will do trial off bactrim. Will start oxybutynin 5mg  qhs Cont premarin cream F/up in 1 month.

## 2016-01-14 NOTE — Progress Notes (Signed)
   Subjective:    Patient ID: Carolyn [name], female    DOB: Aug 23, 1930, 82 y.o.   MRN: [id number]  HPI  82 yo with HTN, hypothyroidism, insomnia, AI,  PAC, recurrent UTI's and several other medical problems presents for follow up for insomnia and recurrent UTI  Insomnia - was using xanax fori nsomnia, Dr. Marijean Bravo asked her to taper it by 12 pills on month 1, 10 on month 2, 8 pills on month 3 and so on. Doing well with the taper. Needs supply for this month (10 tabs).   Recurrent UTI-she had been on longer term bactrim in the past, her main symptom is urinary urgency and mental status change. When this happened in the past, she went to the hopsital and got abx for UTI and then she felt better. That's why she is on long term bactrim. Question was raised whether this is from overactive bladder or recurrent UTI's. Was given premarin cream to see if that helps.   She currently has no urinary symptoms. She is willing to do a trial off of bactrim with some medication to treat her possible overactive bladder.   HTN - her BP was elevated in 160's last visit, but stated that her BP at home were better. Was kept on metoprolol 50mg  24hr tablet daily last vist and asked to check home BP and bring the log. BP remains high today 147/77.    Review of Systems  Constitutional: Negative for fever and chills.  HENT: Negative for congestion and sore throat.   Respiratory: Negative for chest tightness and shortness of breath.   Cardiovascular: Negative for chest pain.  Gastrointestinal: Negative for abdominal pain and abdominal distention.  Neurological: Negative for dizziness and weakness.       Objective:   Physical Exam  Constitutional: She is oriented to person, place, and time. She appears well-developed and well-nourished. No distress.  HENT:  Head: Normocephalic and atraumatic.  Eyes: Conjunctivae are normal. Right eye exhibits no discharge. Left eye exhibits no discharge.  Cardiovascular: Normal  rate and regular rhythm.  Exam reveals no gallop and no friction rub.   No murmur heard. Pulmonary/Chest: Effort normal and breath sounds normal. No respiratory distress. She has no wheezes.  Abdominal: Soft. Bowel sounds are normal. She exhibits no distension. There is no tenderness.  Neurological: She is alert and oriented to person, place, and time. No cranial nerve deficit.  Skin: She is not diaphoretic.     Filed Vitals:   01/14/16 1106 01/14/16 1107  BP:  147/77  Pulse:  78  Temp: 97.6 F (36.4 C)         Assessment & Plan:  See problem based a&p.

## 2016-01-14 NOTE — Assessment & Plan Note (Addendum)
Filed Vitals:   01/14/16 1106 01/14/16 1107  BP:  147/77  Pulse:  78  Temp: 97.6 F (36.4 C)    BP higher than goal. Home readings have also ranged from 140 to 160's.   Will increase metoprolol from 50 to 100mg  q24hr tablet. Asked to call us if she starts feeling dizzy or her BP decreases below 100. F/up in 1 month.

## 2016-01-14 NOTE — Patient Instructions (Signed)
Take oxybutinin 5mg  every night for your urinary symptoms. Don't take bactrim. Try this for 1 month and see how you do.  Take metoprolol 100mg  from now on. If you start feeling dizzy or light headed or your blood pressure gets too low <100, please call us.   Gave you xanax 10 tabs for this month.  Follow up in 1 month. Keep checking your blood pressure at home.

## 2016-01-15 ENCOUNTER — Inpatient Hospital Stay (HOSPITAL_COMMUNITY)
Admission: EM | Admit: 2016-01-15 | Discharge: 2016-01-18 | DRG: 470 | Disposition: A | Discharge status: Skilled Nursing Facility | Payer: Medicare Other | Attending: Student in an Organized Health Care Education/Training Program | Admitting: Student in an Organized Health Care Education/Training Program

## 2016-01-15 ENCOUNTER — Inpatient Hospital Stay (HOSPITAL_COMMUNITY): Payer: Medicare Other | Admitting: Certified Registered"

## 2016-01-15 ENCOUNTER — Inpatient Hospital Stay (HOSPITAL_COMMUNITY): Payer: Medicare Other

## 2016-01-15 ENCOUNTER — Encounter (HOSPITAL_COMMUNITY)
Admission: EM | Disposition: A | Discharge status: Skilled Nursing Facility | Payer: Self-pay | Source: Home / Self Care | Attending: Student in an Organized Health Care Education/Training Program

## 2016-01-15 ENCOUNTER — Other Ambulatory Visit: Payer: Self-pay

## 2016-01-15 ENCOUNTER — Encounter (HOSPITAL_COMMUNITY): Payer: Self-pay | Admitting: Emergency Medicine

## 2016-01-15 ENCOUNTER — Emergency Department (HOSPITAL_COMMUNITY): Payer: Medicare Other

## 2016-01-15 DIAGNOSIS — Z79899 Other long term (current) drug therapy: Secondary | ICD-10-CM

## 2016-01-15 DIAGNOSIS — E7800 Pure hypercholesterolemia, unspecified: Secondary | ICD-10-CM | POA: Diagnosis present

## 2016-01-15 DIAGNOSIS — F05 Delirium due to known physiological condition: Secondary | ICD-10-CM | POA: Diagnosis not present

## 2016-01-15 DIAGNOSIS — M25552 Pain in left hip: Secondary | ICD-10-CM | POA: Diagnosis present

## 2016-01-15 DIAGNOSIS — Z961 Presence of intraocular lens: Secondary | ICD-10-CM | POA: Diagnosis present

## 2016-01-15 DIAGNOSIS — Z96641 Presence of right artificial hip joint: Secondary | ICD-10-CM

## 2016-01-15 DIAGNOSIS — M810 Age-related osteoporosis without current pathological fracture: Secondary | ICD-10-CM | POA: Diagnosis present

## 2016-01-15 DIAGNOSIS — Y92000 Kitchen of unspecified non-institutional (private) residence as  the place of occurrence of the external cause: Secondary | ICD-10-CM

## 2016-01-15 DIAGNOSIS — E785 Hyperlipidemia, unspecified: Secondary | ICD-10-CM | POA: Diagnosis not present

## 2016-01-15 DIAGNOSIS — S72001A Fracture of unspecified part of neck of right femur, initial encounter for closed fracture: Secondary | ICD-10-CM | POA: Diagnosis present

## 2016-01-15 DIAGNOSIS — S4991XA Unspecified injury of right shoulder and upper arm, initial encounter: Secondary | ICD-10-CM | POA: Diagnosis not present

## 2016-01-15 DIAGNOSIS — S72011A Unspecified intracapsular fracture of right femur, initial encounter for closed fracture: Secondary | ICD-10-CM | POA: Diagnosis not present

## 2016-01-15 DIAGNOSIS — W010XXD Fall on same level from slipping, tripping and stumbling without subsequent striking against object, subsequent encounter: Secondary | ICD-10-CM | POA: Diagnosis not present

## 2016-01-15 DIAGNOSIS — I491 Atrial premature depolarization: Secondary | ICD-10-CM | POA: Diagnosis not present

## 2016-01-15 DIAGNOSIS — Z419 Encounter for procedure for purposes other than remedying health state, unspecified: Secondary | ICD-10-CM

## 2016-01-15 DIAGNOSIS — F329 Major depressive disorder, single episode, unspecified: Secondary | ICD-10-CM | POA: Diagnosis present

## 2016-01-15 DIAGNOSIS — G4700 Insomnia, unspecified: Secondary | ICD-10-CM | POA: Diagnosis not present

## 2016-01-15 DIAGNOSIS — M25551 Pain in right hip: Secondary | ICD-10-CM | POA: Diagnosis not present

## 2016-01-15 DIAGNOSIS — E039 Hypothyroidism, unspecified: Secondary | ICD-10-CM | POA: Diagnosis not present

## 2016-01-15 DIAGNOSIS — N3941 Urge incontinence: Secondary | ICD-10-CM | POA: Diagnosis present

## 2016-01-15 DIAGNOSIS — Z87891 Personal history of nicotine dependence: Secondary | ICD-10-CM | POA: Diagnosis not present

## 2016-01-15 DIAGNOSIS — I129 Hypertensive chronic kidney disease with stage 1 through stage 4 chronic kidney disease, or unspecified chronic kidney disease: Secondary | ICD-10-CM | POA: Diagnosis not present

## 2016-01-15 DIAGNOSIS — W010XXA Fall on same level from slipping, tripping and stumbling without subsequent striking against object, initial encounter: Secondary | ICD-10-CM | POA: Diagnosis present

## 2016-01-15 DIAGNOSIS — S72001D Fracture of unspecified part of neck of right femur, subsequent encounter for closed fracture with routine healing: Secondary | ICD-10-CM

## 2016-01-15 DIAGNOSIS — M25411 Effusion, right shoulder: Secondary | ICD-10-CM

## 2016-01-15 DIAGNOSIS — D696 Thrombocytopenia, unspecified: Secondary | ICD-10-CM | POA: Diagnosis not present

## 2016-01-15 DIAGNOSIS — R2681 Unsteadiness on feet: Secondary | ICD-10-CM | POA: Diagnosis not present

## 2016-01-15 DIAGNOSIS — Z87440 Personal history of urinary (tract) infections: Secondary | ICD-10-CM

## 2016-01-15 DIAGNOSIS — R410 Disorientation, unspecified: Secondary | ICD-10-CM | POA: Diagnosis not present

## 2016-01-15 DIAGNOSIS — Z9842 Cataract extraction status, left eye: Secondary | ICD-10-CM | POA: Diagnosis not present

## 2016-01-15 DIAGNOSIS — S72041A Displaced fracture of base of neck of right femur, initial encounter for closed fracture: Secondary | ICD-10-CM | POA: Diagnosis not present

## 2016-01-15 DIAGNOSIS — S72009A Fracture of unspecified part of neck of unspecified femur, initial encounter for closed fracture: Secondary | ICD-10-CM | POA: Diagnosis not present

## 2016-01-15 DIAGNOSIS — K2270 Barrett's esophagus without dysplasia: Secondary | ICD-10-CM | POA: Diagnosis not present

## 2016-01-15 DIAGNOSIS — Z96642 Presence of left artificial hip joint: Secondary | ICD-10-CM | POA: Diagnosis present

## 2016-01-15 DIAGNOSIS — F419 Anxiety disorder, unspecified: Secondary | ICD-10-CM | POA: Diagnosis present

## 2016-01-15 DIAGNOSIS — T148 Other injury of unspecified body region: Secondary | ICD-10-CM | POA: Diagnosis not present

## 2016-01-15 DIAGNOSIS — J439 Emphysema, unspecified: Secondary | ICD-10-CM | POA: Diagnosis not present

## 2016-01-15 DIAGNOSIS — M6281 Muscle weakness (generalized): Secondary | ICD-10-CM | POA: Diagnosis not present

## 2016-01-15 DIAGNOSIS — M25511 Pain in right shoulder: Secondary | ICD-10-CM | POA: Diagnosis not present

## 2016-01-15 DIAGNOSIS — I10 Essential (primary) hypertension: Secondary | ICD-10-CM | POA: Diagnosis present

## 2016-01-15 DIAGNOSIS — K219 Gastro-esophageal reflux disease without esophagitis: Secondary | ICD-10-CM | POA: Diagnosis present

## 2016-01-15 DIAGNOSIS — N3281 Overactive bladder: Secondary | ICD-10-CM | POA: Diagnosis present

## 2016-01-15 DIAGNOSIS — D649 Anemia, unspecified: Secondary | ICD-10-CM | POA: Diagnosis not present

## 2016-01-15 DIAGNOSIS — J449 Chronic obstructive pulmonary disease, unspecified: Secondary | ICD-10-CM | POA: Diagnosis present

## 2016-01-15 DIAGNOSIS — M1990 Unspecified osteoarthritis, unspecified site: Secondary | ICD-10-CM | POA: Diagnosis not present

## 2016-01-15 DIAGNOSIS — N390 Urinary tract infection, site not specified: Secondary | ICD-10-CM | POA: Diagnosis not present

## 2016-01-15 DIAGNOSIS — Z471 Aftercare following joint replacement surgery: Secondary | ICD-10-CM | POA: Diagnosis not present

## 2016-01-15 DIAGNOSIS — Z9841 Cataract extraction status, right eye: Secondary | ICD-10-CM

## 2016-01-15 DIAGNOSIS — N189 Chronic kidney disease, unspecified: Secondary | ICD-10-CM | POA: Diagnosis not present

## 2016-01-15 DIAGNOSIS — Z86718 Personal history of other venous thrombosis and embolism: Secondary | ICD-10-CM

## 2016-01-15 DIAGNOSIS — W19XXXD Unspecified fall, subsequent encounter: Secondary | ICD-10-CM | POA: Diagnosis not present

## 2016-01-15 HISTORY — PX: ANTERIOR APPROACH HEMI HIP ARTHROPLASTY: SHX6690

## 2016-01-15 LAB — COMPREHENSIVE METABOLIC PANEL
ALK PHOS: 67 U/L (ref 38–126)
ALT: 14 U/L (ref 14–54)
AST: 29 U/L (ref 15–41)
Albumin: 3.6 g/dL (ref 3.5–5.0)
Anion gap: 7 (ref 5–15)
BILIRUBIN TOTAL: 0.5 mg/dL (ref 0.3–1.2)
BUN: 13 mg/dL (ref 6–20)
CALCIUM: 10 mg/dL (ref 8.9–10.3)
CHLORIDE: 98 mmol/L — AB (ref 101–111)
CO2: 26 mmol/L (ref 22–32)
CREATININE: 1.06 mg/dL — AB (ref 0.44–1.00)
GFR, EST AFRICAN AMERICAN: 55 mL/min — AB (ref >60)
GFR, EST NON AFRICAN AMERICAN: 48 mL/min — AB (ref >60)
Glucose, Bld: 101 mg/dL — ABNORMAL HIGH (ref 65–99)
Potassium: 4.8 mmol/L (ref 3.5–5.1)
Sodium: 131 mmol/L — ABNORMAL LOW (ref 135–145)
Total Protein: 7.2 g/dL (ref 6.5–8.1)

## 2016-01-15 LAB — CBC WITH DIFFERENTIAL/PLATELET
BASOS PCT: 0 %
Basophils Absolute: 0 10*3/uL (ref 0.0–0.1)
EOS ABS: 0.1 10*3/uL (ref 0.0–0.7)
EOS PCT: 1 %
HCT: 41.6 % (ref 36.0–46.0)
HEMOGLOBIN: 13.8 g/dL (ref 12.0–15.0)
LYMPHS ABS: 1.1 10*3/uL (ref 0.7–4.0)
Lymphocytes Relative: 21 %
MCH: 31.7 pg (ref 26.0–34.0)
MCHC: 33.2 g/dL (ref 30.0–36.0)
MCV: 95.4 fL (ref 78.0–100.0)
Monocytes Absolute: 0.4 10*3/uL (ref 0.1–1.0)
Monocytes Relative: 8 %
NEUTROS PCT: 70 %
Neutro Abs: 3.7 10*3/uL (ref 1.7–7.7)
PLATELETS: 193 10*3/uL (ref 150–400)
RBC: 4.36 MIL/uL (ref 3.87–5.11)
RDW: 13.4 % (ref 11.5–15.5)
WBC: 5.3 10*3/uL (ref 4.0–10.5)

## 2016-01-15 LAB — URINALYSIS, ROUTINE W REFLEX MICROSCOPIC
BILIRUBIN URINE: NEGATIVE
Glucose, UA: NEGATIVE mg/dL
HGB URINE DIPSTICK: NEGATIVE
Ketones, ur: NEGATIVE mg/dL
Leukocytes, UA: NEGATIVE
NITRITE: NEGATIVE
PROTEIN: NEGATIVE mg/dL
Specific Gravity, Urine: 1.022 (ref 1.005–1.030)
pH: 7 (ref 5.0–8.0)

## 2016-01-15 LAB — CBC
HCT: 41 % (ref 36.0–46.0)
Hemoglobin: 13.6 g/dL (ref 12.0–15.0)
MCH: 31.7 pg (ref 26.0–34.0)
MCHC: 33.2 g/dL (ref 30.0–36.0)
MCV: 95.6 fL (ref 78.0–100.0)
PLATELETS: 209 10*3/uL (ref 150–400)
RBC: 4.29 MIL/uL (ref 3.87–5.11)
RDW: 13.5 % (ref 11.5–15.5)
WBC: 10.1 10*3/uL (ref 4.0–10.5)

## 2016-01-15 SURGERY — HEMIARTHROPLASTY, HIP, DIRECT ANTERIOR APPROACH, FOR FRACTURE
Anesthesia: General | Site: Hip | Laterality: Right

## 2016-01-15 MED ORDER — CEFAZOLIN SODIUM 1 G IJ SOLR
INTRAMUSCULAR | Status: AC
  Filled 2016-01-15: qty 10

## 2016-01-15 MED ORDER — SUGAMMADEX SODIUM 200 MG/2ML IV SOLN: INTRAVENOUS | Status: DC | PRN

## 2016-01-15 MED ORDER — METOPROLOL SUCCINATE ER 50 MG PO TB24
50.0000 mg | ORAL_TABLET | Freq: Every day | ORAL | Status: DC
  Administered 2016-01-16 – 2016-01-18 (×2): 50 mg via ORAL
  Filled 2016-01-15 (×3): qty 1

## 2016-01-15 MED ORDER — METHOCARBAMOL 1000 MG/10ML IJ SOLN
500.0000 mg | Freq: Four times a day (QID) | INTRAVENOUS | Status: DC | PRN
  Filled 2016-01-15: qty 5

## 2016-01-15 MED ORDER — METOCLOPRAMIDE HCL 5 MG PO TABS: 5.0000 mg | ORAL_TABLET | Freq: Three times a day (TID) | ORAL | Status: DC | PRN

## 2016-01-15 MED ORDER — ROCURONIUM BROMIDE 100 MG/10ML IV SOLN
INTRAVENOUS | Status: DC | PRN
  Administered 2016-01-15 (×2): 10 mg via INTRAVENOUS

## 2016-01-15 MED ORDER — MORPHINE SULFATE (PF) 2 MG/ML IV SOLN: 0.5000 mg | INTRAVENOUS | Status: DC | PRN

## 2016-01-15 MED ORDER — SUGAMMADEX SODIUM 200 MG/2ML IV SOLN
INTRAVENOUS | Status: AC
  Filled 2016-01-15: qty 2

## 2016-01-15 MED ORDER — ONDANSETRON HCL 4 MG/2ML IJ SOLN: 4.0000 mg | Freq: Four times a day (QID) | INTRAMUSCULAR | Status: DC | PRN

## 2016-01-15 MED ORDER — METHOCARBAMOL 500 MG PO TABS
500.0000 mg | ORAL_TABLET | Freq: Four times a day (QID) | ORAL | Status: DC | PRN
  Filled 2016-01-15: qty 1

## 2016-01-15 MED ORDER — FENTANYL CITRATE (PF) 100 MCG/2ML IJ SOLN
50.0000 ug | Freq: Once | INTRAMUSCULAR | Status: AC
  Administered 2016-01-15: 50 ug via INTRAVENOUS
  Filled 2016-01-15: qty 2

## 2016-01-15 MED ORDER — PROPOFOL 10 MG/ML IV BOLUS
INTRAVENOUS | Status: AC
  Filled 2016-01-15: qty 20

## 2016-01-15 MED ORDER — CEFAZOLIN SODIUM-DEXTROSE 2-3 GM-% IV SOLR
INTRAVENOUS | Status: DC | PRN
  Administered 2016-01-15: 2 g via INTRAVENOUS

## 2016-01-15 MED ORDER — ONDANSETRON HCL 4 MG PO TABS: 4.0000 mg | ORAL_TABLET | Freq: Four times a day (QID) | ORAL | Status: DC | PRN

## 2016-01-15 MED ORDER — FENTANYL CITRATE (PF) 100 MCG/2ML IJ SOLN: INTRAMUSCULAR | Status: DC | PRN

## 2016-01-15 MED ORDER — SODIUM CHLORIDE 0.9 % IV SOLN
INTRAVENOUS | Status: AC
  Administered 2016-01-15: 19:00:00 via INTRAVENOUS

## 2016-01-15 MED ORDER — PROPOFOL 10 MG/ML IV BOLUS
INTRAVENOUS | Status: DC | PRN
  Administered 2016-01-15: 100 mg via INTRAVENOUS

## 2016-01-15 MED ORDER — GABAPENTIN 300 MG PO CAPS
600.0000 mg | ORAL_CAPSULE | Freq: Every day | ORAL | Status: DC
  Administered 2016-01-16 – 2016-01-17 (×2): 600 mg via ORAL
  Filled 2016-01-15 (×3): qty 2

## 2016-01-15 MED ORDER — METOCLOPRAMIDE HCL 5 MG/ML IJ SOLN: 5.0000 mg | Freq: Three times a day (TID) | INTRAMUSCULAR | Status: DC | PRN

## 2016-01-15 MED ORDER — LIDOCAINE HCL (CARDIAC) 20 MG/ML IV SOLN
INTRAVENOUS | Status: DC | PRN
  Administered 2016-01-15: 20 mg via INTRAVENOUS

## 2016-01-15 MED ORDER — ACETAMINOPHEN 650 MG RE SUPP: 650.0000 mg | Freq: Four times a day (QID) | RECTAL | Status: DC | PRN

## 2016-01-15 MED ORDER — FENTANYL CITRATE (PF) 100 MCG/2ML IJ SOLN
INTRAMUSCULAR | Status: AC
  Filled 2016-01-15: qty 2

## 2016-01-15 MED ORDER — ARTIFICIAL TEARS OP OINT
TOPICAL_OINTMENT | OPHTHALMIC | Status: AC
  Filled 2016-01-15: qty 3.5

## 2016-01-15 MED ORDER — ONDANSETRON HCL 4 MG/2ML IJ SOLN
4.0000 mg | Freq: Once | INTRAMUSCULAR | Status: AC
  Administered 2016-01-15: 4 mg via INTRAVENOUS
  Filled 2016-01-15: qty 2

## 2016-01-15 MED ORDER — CEFAZOLIN SODIUM-DEXTROSE 2-4 GM/100ML-% IV SOLN
2.0000 g | Freq: Four times a day (QID) | INTRAVENOUS | Status: AC
  Administered 2016-01-16 (×2): 2 g via INTRAVENOUS
  Filled 2016-01-15 (×2): qty 100

## 2016-01-15 MED ORDER — FENTANYL CITRATE (PF) 250 MCG/5ML IJ SOLN
INTRAMUSCULAR | Status: AC
  Filled 2016-01-15: qty 5

## 2016-01-15 MED ORDER — PHENYLEPHRINE HCL 10 MG/ML IJ SOLN
10.0000 mg | INTRAVENOUS | Status: DC | PRN
  Administered 2016-01-15: 50 ug/min via INTRAVENOUS

## 2016-01-15 MED ORDER — LABETALOL HCL 5 MG/ML IV SOLN
INTRAVENOUS | Status: DC | PRN
  Administered 2016-01-15: 10 mg via INTRAVENOUS

## 2016-01-15 MED ORDER — HYDROCODONE-ACETAMINOPHEN 5-325 MG PO TABS
1.0000 | ORAL_TABLET | Freq: Four times a day (QID) | ORAL | Status: DC | PRN
  Administered 2016-01-15 – 2016-01-16 (×4): 1 via ORAL
  Filled 2016-01-15 (×2): qty 1
  Filled 2016-01-15: qty 2
  Filled 2016-01-15: qty 1

## 2016-01-15 MED ORDER — MENTHOL 3 MG MT LOZG: 1.0000 | LOZENGE | OROMUCOSAL | Status: DC | PRN

## 2016-01-15 MED ORDER — SUCCINYLCHOLINE CHLORIDE 20 MG/ML IJ SOLN
INTRAMUSCULAR | Status: DC | PRN
  Administered 2016-01-15: 100 mg via INTRAVENOUS

## 2016-01-15 MED ORDER — SODIUM CHLORIDE 0.9 % IR SOLN
Status: DC | PRN
  Administered 2016-01-15: 1000 mL

## 2016-01-15 MED ORDER — SODIUM CHLORIDE 0.9% FLUSH: 3.0000 mL | Freq: Two times a day (BID) | INTRAVENOUS | Status: DC

## 2016-01-15 MED ORDER — ASPIRIN EC 325 MG PO TBEC
325.0000 mg | DELAYED_RELEASE_TABLET | Freq: Two times a day (BID) | ORAL | Status: DC
  Filled 2016-01-15: qty 1

## 2016-01-15 MED ORDER — FERROUS SULFATE 325 (65 FE) MG PO TABS
325.0000 mg | ORAL_TABLET | Freq: Every day | ORAL | Status: DC
  Administered 2016-01-16: 325 mg via ORAL
  Filled 2016-01-15: qty 1

## 2016-01-15 MED ORDER — ACETAMINOPHEN 325 MG PO TABS
650.0000 mg | ORAL_TABLET | Freq: Four times a day (QID) | ORAL | Status: DC | PRN
  Administered 2016-01-15: 650 mg via ORAL
  Filled 2016-01-15: qty 2

## 2016-01-15 MED ORDER — FENTANYL CITRATE (PF) 100 MCG/2ML IJ SOLN
INTRAMUSCULAR | Status: AC
  Administered 2016-01-15: 25 ug via INTRAVENOUS
  Filled 2016-01-15: qty 2

## 2016-01-15 MED ORDER — SUGAMMADEX SODIUM 200 MG/2ML IV SOLN
INTRAVENOUS | Status: DC | PRN
  Administered 2016-01-15: 150 mg via INTRAVENOUS

## 2016-01-15 MED ORDER — FENTANYL CITRATE (PF) 100 MCG/2ML IJ SOLN
INTRAMUSCULAR | Status: DC | PRN
  Administered 2016-01-15: 150 ug via INTRAVENOUS
  Administered 2016-01-15: 100 ug via INTRAVENOUS

## 2016-01-15 MED ORDER — DOCUSATE SODIUM 100 MG PO CAPS: 100.0000 mg | ORAL_CAPSULE | Freq: Two times a day (BID) | ORAL | Status: DC

## 2016-01-15 MED ORDER — LIDOCAINE 2% (20 MG/ML) 5 ML SYRINGE
INTRAMUSCULAR | Status: AC
  Filled 2016-01-15: qty 10

## 2016-01-15 MED ORDER — SUCCINYLCHOLINE CHLORIDE 200 MG/10ML IV SOSY
PREFILLED_SYRINGE | INTRAVENOUS | Status: AC
  Filled 2016-01-15: qty 10

## 2016-01-15 MED ORDER — ONDANSETRON HCL 4 MG/2ML IJ SOLN
4.0000 mg | Freq: Four times a day (QID) | INTRAMUSCULAR | Status: DC | PRN
  Administered 2016-01-15: 4 mg via INTRAVENOUS

## 2016-01-15 MED ORDER — DOCUSATE SODIUM 100 MG PO CAPS
100.0000 mg | ORAL_CAPSULE | Freq: Two times a day (BID) | ORAL | Status: DC
  Administered 2016-01-15 – 2016-01-16 (×3): 100 mg via ORAL
  Filled 2016-01-15 (×4): qty 1

## 2016-01-15 MED ORDER — ONDANSETRON HCL 4 MG/2ML IJ SOLN
INTRAMUSCULAR | Status: AC
  Filled 2016-01-15: qty 2

## 2016-01-15 MED ORDER — PANTOPRAZOLE SODIUM 40 MG PO TBEC
80.0000 mg | DELAYED_RELEASE_TABLET | Freq: Every day | ORAL | Status: DC
  Administered 2016-01-16 – 2016-01-18 (×2): 80 mg via ORAL
  Filled 2016-01-15 (×2): qty 2

## 2016-01-15 MED ORDER — SENNOSIDES-DOCUSATE SODIUM 8.6-50 MG PO TABS
2.0000 | ORAL_TABLET | Freq: Every day | ORAL | Status: DC
  Administered 2016-01-16 – 2016-01-17 (×2): 2 via ORAL
  Filled 2016-01-15 (×4): qty 2

## 2016-01-15 MED ORDER — PHENYLEPHRINE HCL 10 MG/ML IJ SOLN: 10.0000 mg | INTRAVENOUS | Status: DC | PRN

## 2016-01-15 MED ORDER — ROCURONIUM BROMIDE 50 MG/5ML IV SOLN
INTRAVENOUS | Status: AC
  Filled 2016-01-15: qty 2

## 2016-01-15 MED ORDER — SIMVASTATIN 10 MG PO TABS
10.0000 mg | ORAL_TABLET | Freq: Every day | ORAL | Status: DC
  Administered 2016-01-16 – 2016-01-17 (×2): 10 mg via ORAL
  Filled 2016-01-15 (×2): qty 1

## 2016-01-15 MED ORDER — PHENOL 1.4 % MT LIQD: 1.0000 | OROMUCOSAL | Status: DC | PRN

## 2016-01-15 MED ORDER — FENTANYL CITRATE (PF) 100 MCG/2ML IJ SOLN
25.0000 ug | INTRAMUSCULAR | Status: DC | PRN
  Administered 2016-01-15 (×2): 25 ug via INTRAVENOUS
  Administered 2016-01-15: 50 ug via INTRAVENOUS

## 2016-01-15 MED ORDER — LEVOTHYROXINE SODIUM 88 MCG PO TABS
88.0000 ug | ORAL_TABLET | Freq: Every day | ORAL | Status: DC
  Administered 2016-01-16 – 2016-01-18 (×2): 88 ug via ORAL
  Filled 2016-01-15 (×3): qty 1

## 2016-01-15 MED ORDER — SENNA 8.6 MG PO TABS: 1.0000 | ORAL_TABLET | Freq: Two times a day (BID) | ORAL | Status: DC

## 2016-01-15 MED ORDER — OXYBUTYNIN CHLORIDE ER 5 MG PO TB24
5.0000 mg | ORAL_TABLET | Freq: Every day | ORAL | Status: DC
  Administered 2016-01-16: 5 mg via ORAL
  Filled 2016-01-15 (×2): qty 1

## 2016-01-15 MED ORDER — LACTATED RINGERS IV SOLN
INTRAVENOUS | Status: DC | PRN
  Administered 2016-01-15: 20:00:00 via INTRAVENOUS

## 2016-01-15 MED ORDER — ACETAMINOPHEN 325 MG PO TABS
650.0000 mg | ORAL_TABLET | Freq: Four times a day (QID) | ORAL | Status: DC | PRN
  Administered 2016-01-17 – 2016-01-18 (×4): 650 mg via ORAL
  Filled 2016-01-15 (×4): qty 2

## 2016-01-15 SURGICAL SUPPLY — 59 items
APL SKNCLS STERI-STRIP NONHPOA (GAUZE/BANDAGES/DRESSINGS) ×1
BALL HIP ARTICU 28 +5 (Hips) IMPLANT
BENZOIN TINCTURE PRP APPL 2/3 (GAUZE/BANDAGES/DRESSINGS) ×3 IMPLANT
BIPOLAR DEPUY 51 (Hips) ×2 IMPLANT
BIPOLAR DEPUY 51MM (Hips) ×1 IMPLANT
BLADE SAW SGTL 18X1.27X75 (BLADE) ×2 IMPLANT
BLADE SAW SGTL 18X1.27X75MM (BLADE) ×1
BLADE SURG ROTATE 9660 (MISCELLANEOUS) IMPLANT
CAPT HIP HEMI 2 ×2 IMPLANT
CELLS DAT CNTRL 66122 CELL SVR (MISCELLANEOUS) ×1 IMPLANT
CLOSURE WOUND 1/2 X4 (GAUZE/BANDAGES/DRESSINGS) ×2
COVER SURGICAL LIGHT HANDLE (MISCELLANEOUS) ×3 IMPLANT
DRAPE C-ARM 42X72 X-RAY (DRAPES) ×3 IMPLANT
DRAPE STERI IOBAN 125X83 (DRAPES) ×3 IMPLANT
DRAPE U-SHAPE 47X51 STRL (DRAPES) ×9 IMPLANT
DRSG AQUACEL AG ADV 3.5X10 (GAUZE/BANDAGES/DRESSINGS) ×3 IMPLANT
DURAPREP 26ML APPLICATOR (WOUND CARE) ×3 IMPLANT
ELECT BLADE 4.0 EZ CLEAN MEGAD (MISCELLANEOUS) ×3
ELECT BLADE 6.5 EXT (BLADE) IMPLANT
ELECT REM PT RETURN 9FT ADLT (ELECTROSURGICAL) ×3
ELECTRODE BLDE 4.0 EZ CLN MEGD (MISCELLANEOUS) ×1 IMPLANT
ELECTRODE REM PT RTRN 9FT ADLT (ELECTROSURGICAL) ×1 IMPLANT
FACESHIELD WRAPAROUND (MASK) ×6 IMPLANT
FACESHIELD WRAPAROUND OR TEAM (MASK) ×2 IMPLANT
GAUZE XEROFORM 5X9 LF (GAUZE/BANDAGES/DRESSINGS) ×2 IMPLANT
GLOVE BIOGEL PI IND STRL 8 (GLOVE) ×2 IMPLANT
GLOVE BIOGEL PI INDICATOR 8 (GLOVE) ×4
GLOVE ECLIPSE 8.0 STRL XLNG CF (GLOVE) ×3 IMPLANT
GLOVE ORTHO TXT STRL SZ7.5 (GLOVE) ×6 IMPLANT
GOWN STRL REUS W/ TWL LRG LVL3 (GOWN DISPOSABLE) ×2 IMPLANT
GOWN STRL REUS W/ TWL XL LVL3 (GOWN DISPOSABLE) ×2 IMPLANT
GOWN STRL REUS W/TWL LRG LVL3 (GOWN DISPOSABLE) ×6
GOWN STRL REUS W/TWL XL LVL3 (GOWN DISPOSABLE) ×6
HANDPIECE INTERPULSE COAX TIP (DISPOSABLE) ×3
HEAD BIPOLAR DEPUY 51 (Hips) IMPLANT
HIP BALL ARTICU 28 +5 (Hips) ×3 IMPLANT
KIT BASIN OR (CUSTOM PROCEDURE TRAY) ×3 IMPLANT
KIT ROOM TURNOVER OR (KITS) ×3 IMPLANT
MANIFOLD NEPTUNE II (INSTRUMENTS) ×3 IMPLANT
NS IRRIG 1000ML POUR BTL (IV SOLUTION) ×3 IMPLANT
PACK TOTAL JOINT (CUSTOM PROCEDURE TRAY) ×3 IMPLANT
PAD ARMBOARD 7.5X6 YLW CONV (MISCELLANEOUS) ×3 IMPLANT
RETRACTOR WND ALEXIS 18 MED (MISCELLANEOUS) ×1 IMPLANT
RTRCTR WOUND ALEXIS 18CM MED (MISCELLANEOUS) ×3
SET HNDPC FAN SPRY TIP SCT (DISPOSABLE) ×1 IMPLANT
STAPLER VISISTAT 35W (STAPLE) IMPLANT
STRIP CLOSURE SKIN 1/2X4 (GAUZE/BANDAGES/DRESSINGS) ×4 IMPLANT
SUT ETHIBOND NAB CT1 #1 30IN (SUTURE) ×3 IMPLANT
SUT MNCRL AB 4-0 PS2 18 (SUTURE) IMPLANT
SUT VIC AB 0 CT1 27 (SUTURE) ×3
SUT VIC AB 0 CT1 27XBRD ANBCTR (SUTURE) ×1 IMPLANT
SUT VIC AB 1 CT1 27 (SUTURE) ×3
SUT VIC AB 1 CT1 27XBRD ANBCTR (SUTURE) ×1 IMPLANT
SUT VIC AB 2-0 CT1 27 (SUTURE) ×6
SUT VIC AB 2-0 CT1 TAPERPNT 27 (SUTURE) ×1 IMPLANT
TOWEL OR 17X24 6PK STRL BLUE (TOWEL DISPOSABLE) ×3 IMPLANT
TOWEL OR 17X26 10 PK STRL BLUE (TOWEL DISPOSABLE) ×3 IMPLANT
TRAY FOLEY CATH 16FRSI W/METER (SET/KITS/TRAYS/PACK) IMPLANT
WATER STERILE IRR 1000ML POUR (IV SOLUTION) ×6 IMPLANT

## 2016-01-15 NOTE — ED Notes (Signed)
Phlebotomy at the bedside  

## 2016-01-15 NOTE — Consult Note (Signed)
Reason for Consult:  Right hip fracture Referring Physician: EDP  Carolyn [name] is an 82 y.o. female.  HPI:   82 yo female who sustained an accidental fall earlier today and was transported to the [facility] with right hip pain and the inability to ambulate.  In the ED she was found to have a displaced right hip femoral neck fracture and Ortho was consulted as well as the Medicine Service.  She currently has family at the bedside and does complain of right hip pain.  She states that the fall was accidental.  She currently denies any chest pain or shortness of breath and denies other injuries other than right hip pain.  Past Medical History  Diagnosis Date  . Hypertension   . High cholesterol   . Hypothyroidism   . Asthma     "only when I smoked"  . Hiatal hernia     s/p gastropexy after failed Nissen fundoplication.   . Barrett's esophagus since at least 1985  . Arthritis 11/2013.     "both knees" (11/26/2013)  . Depression with anxiety 11/2013    "little bit"   . COPD (chronic obstructive pulmonary disease) ([place])   . Pneumonia 11/2013  . Diverticulosis of colon 1996  . CKD (chronic kidney disease)     GFR 55 11/2014   . GAVE (gastric antral vascular ectasia) 01/16/2015  . Hx of adenomatous colonic polyps     Past Surgical History  Procedure Laterality Date  . Nissen fundoplication      this ultimately failed and she underwent gastropexy  . Cholecystectomy    . Bladder suspension      .  bladder tack x 3.   . Abdominal hysterectomy    . Cataract extraction w/ intraocular lens  implant, bilateral Bilateral   . Appendectomy    . Hip arthroplasty Left 02/07/2014    Procedure: LEFT HIP PRESS FIT MONOPOLAR HEMIARTHROPLASTY ;  Surgeon: Marybelle Killings, MD;  Location: [facility];  Service: Orthopedics;  Laterality: Left;  . Esophagogastroduodenoscopy N/A 09/01/2014    Procedure: ESOPHAGOGASTRODUODENOSCOPY (EGD);  Surgeon: Lafayette Dragon, MD;  Location: [facility];  Service: Endoscopy;   Laterality: N/A;  . Gastropexy  1989  . Enterocele repair  2003    with colopexy.   . Colonoscopy N/A 01/16/2015    Procedure: COLONOSCOPY;  Surgeon: Gatha Mayer, MD;  Location: [facility] ENDOSCOPY;  Service: Endoscopy;  Laterality: N/A;  . Esophagogastroduodenoscopy N/A 01/16/2015    Procedure: ESOPHAGOGASTRODUODENOSCOPY (EGD);  Surgeon: Gatha Mayer, MD;  Location: [facility] ENDOSCOPY;  Service: Endoscopy;  Laterality: N/A;    Family History  Problem Relation Age of Onset  . Heart disease Mother   . Heart disease Father   . Heart disease Brother   . Bladder Cancer Sister   . Heart disease Brother   . Stomach cancer Mother     Social History:  reports that she has quit smoking. Her smoking use included Cigarettes. She has a 10 pack-year smoking history. She has never used smokeless tobacco. She reports that she does not drink alcohol or use illicit drugs.  Allergies:  Allergies  Allergen Reactions  . Penicillins Hives    Medications: I have reviewed the patient's current medications.  Results for orders placed or performed during the hospital encounter of 01/15/16 (from the past 48 hour(s))  Urinalysis, Routine w reflex microscopic     Status: Abnormal   Collection Time: 01/15/16  2:41 PM  Result Value Ref Range  Color, Urine YELLOW YELLOW   APPearance CLOUDY (A) CLEAR   Specific Gravity, Urine 1.022 1.005 - 1.030   pH 7.0 5.0 - 8.0   Glucose, UA NEGATIVE NEGATIVE mg/dL   Hgb urine dipstick NEGATIVE NEGATIVE   Bilirubin Urine NEGATIVE NEGATIVE   Ketones, ur NEGATIVE NEGATIVE mg/dL   Protein, ur NEGATIVE NEGATIVE mg/dL   Nitrite NEGATIVE NEGATIVE   Leukocytes, UA NEGATIVE NEGATIVE    Comment: MICROSCOPIC NOT DONE ON URINES WITH NEGATIVE PROTEIN, BLOOD, LEUKOCYTES, NITRITE, OR GLUCOSE <1000 mg/dL.  Comprehensive metabolic panel     Status: Abnormal   Collection Time: 01/15/16  2:50 PM  Result Value Ref Range   Sodium 131 (L) 135 - 145 mmol/L   Potassium 4.8 3.5 - 5.1 mmol/L    Chloride 98 (L) 101 - 111 mmol/L   CO2 26 22 - 32 mmol/L   Glucose, Bld 101 (H) 65 - 99 mg/dL   BUN 13 6 - 20 mg/dL   Creatinine, Ser 1.06 (H) 0.44 - 1.00 mg/dL   Calcium 10.0 8.9 - 10.3 mg/dL   Total Protein 7.2 6.5 - 8.1 g/dL   Albumin 3.6 3.5 - 5.0 g/dL   AST 29 15 - 41 U/L   ALT 14 14 - 54 U/L   Alkaline Phosphatase 67 38 - 126 U/L   Total Bilirubin 0.5 0.3 - 1.2 mg/dL   GFR calc non Af Amer 48 (L) >60 mL/min   GFR calc Af Amer 55 (L) >60 mL/min    Comment: (NOTE) The eGFR has been calculated using the CKD EPI equation. This calculation has not been validated in all clinical situations. eGFR's persistently <60 mL/min signify possible Chronic Kidney Disease.    Anion gap 7 5 - 15  CBC with Differential     Status: None   Collection Time: 01/15/16  2:50 PM  Result Value Ref Range   WBC 5.3 4.0 - 10.5 K/uL   RBC 4.36 3.87 - 5.11 MIL/uL   Hemoglobin 13.8 12.0 - 15.0 g/dL   HCT 41.6 36.0 - 46.0 %   MCV 95.4 78.0 - 100.0 fL   MCH 31.7 26.0 - 34.0 pg   MCHC 33.2 30.0 - 36.0 g/dL   RDW 13.4 11.5 - 15.5 %   Platelets 193 150 - 400 K/uL   Neutrophils Relative % 70 %   Neutro Abs 3.7 1.7 - 7.7 K/uL   Lymphocytes Relative 21 %   Lymphs Abs 1.1 0.7 - 4.0 K/uL   Monocytes Relative 8 %   Monocytes Absolute 0.4 0.1 - 1.0 K/uL   Eosinophils Relative 1 %   Eosinophils Absolute 0.1 0.0 - 0.7 K/uL   Basophils Relative 0 %   Basophils Absolute 0.0 0.0 - 0.1 K/uL    Dg Chest 1 View  01/15/2016  CLINICAL DATA:  Patient fell today.  Hip pain. EXAM: CHEST 1 VIEW COMPARISON:  07/14/2015 FINDINGS: Numerous naps overlie the chest. Lungs are hyperexpanded. Atelectasis or scarring noted left base. Interstitial markings are diffusely coarsened with chronic features. The cardiopericardial silhouette is within normal limits for size. Small hiatal hernia noted. Bones are diffusely demineralized. IMPRESSION: Emphysema with atelectasis or scarring at the left base. Small hiatal hernia. Electronically  Signed   By: Misty Stanley M.D.   On: 01/15/2016 16:17   Dg Hip Unilat With Pelvis 2-3 Views Right  01/15/2016  CLINICAL DATA:  RIGHT hip pain after falling today, initial encounter EXAM: DG HIP (WITH OR WITHOUT PELVIS) 2-3V RIGHT COMPARISON:  None  FINDINGS: Marked osseous demineralization. LEFT hip prosthesis. SI joints preserved. Displaced subcapital RIGHT femoral neck fracture. No dislocation. Pelvis appears intact. Scattered atherosclerotic calcifications aorta and iliac arteries. IMPRESSION: Osseous demineralization with displaced subcapital fracture RIGHT femoral neck. Prior LEFT hip replacement. Aortic atherosclerosis. Electronically Signed   By: Lavonia Dana M.D.   On: 01/15/2016 16:19    Review of Systems  All other systems reviewed and are negative.  Blood pressure 178/100, pulse 67, resp. rate 12, SpO2 94 %. Physical Exam  Constitutional: She appears well-developed and well-nourished.  HENT:  Head: Normocephalic and atraumatic.  Eyes: EOM are normal. Pupils are equal, round, and reactive to light.  Neck: Normal range of motion. Neck supple.  Cardiovascular: Normal rate.   Respiratory: Effort normal and breath sounds normal.  GI: Soft. Bowel sounds are normal.  Musculoskeletal:       Right hip: She exhibits decreased range of motion, decreased strength, tenderness, bony tenderness and deformity.  Neurological: She is alert.  Skin: Skin is warm and dry.  Psychiatric: She has a normal mood and affect.   Her right LE is shortened and externally rotated   Assessment/Plan: Displaced right hip femoral neck fracture 1)  To the OR this evening for a right hip hemiarthroplasty (partial hip replacement) to treat her fracture.  I have discussed this in detail to her and her daughter and talked with the Medicine Service.  We did have a thorough discussion of the risks and benefits of surgery.  Mcarthur Rossetti 01/15/2016, 5:13 PM

## 2016-01-15 NOTE — ED Notes (Signed)
Admitting MD at the bedside.  

## 2016-01-15 NOTE — Anesthesia Postprocedure Evaluation (Signed)
Anesthesia Post Note  Patient: [name]  Procedure(s) Performed: Procedure(s) (LRB): ANTERIOR APPROACH HEMI HIP ARTHROPLASTY (Right)  Patient location during evaluation: PACU Anesthesia Type: General Level of consciousness: awake and alert, oriented and patient cooperative Pain management: pain level controlled Vital Signs Assessment: post-procedure vital signs reviewed and stable Respiratory status: spontaneous breathing, nonlabored ventilation, respiratory function stable and patient connected to nasal cannula oxygen Cardiovascular status: blood pressure returned to baseline and stable Postop Assessment: no signs of nausea or vomiting Anesthetic complications: no    Last Vitals:  Filed Vitals:   01/15/16 2253 01/15/16 2254  BP: 168/93   Pulse: 77   Temp:  36.9 C  Resp: 11     Last Pain:  Filed Vitals:   01/15/16 2255  PainSc: 6         RLE Motor Response: Responds to commands (01/15/16 2254) RLE Sensation: Pain (01/15/16 2254)      Seleta Rhymes. Tavi Hoogendoorn

## 2016-01-15 NOTE — ED Provider Notes (Signed)
CSN: [id number]     Arrival date & time 01/15/16  1348 History   First MD Initiated Contact with Patient 01/15/16 1358     Chief Complaint  Patient presents with  . Fall  . Hip Pain     Patient is a 82 y.o. female presenting with fall and hip pain. The history is provided by the patient. No language interpreter was used.  Fall  Hip Pain   IQRA SUHR is a 82 y.o. female who presents to the Emergency Department complaining of fall, hip pain.  She was at home talking on the phone to her granddaughter when she got tripped in her feet and fell to the ground striking her right hip. She denies any head injury or loss of consciousness. She reports isolated pain to the right hip. She has a history of prior hip fracture with surgery by Dr. Lorin Mercy.  Past Medical History  Diagnosis Date  . Hypertension   . High cholesterol   . Hypothyroidism   . Asthma     "only when I smoked"  . Hiatal hernia     s/p gastropexy after failed Nissen fundoplication.   . Barrett's esophagus since at least 1985  . Arthritis 11/2013.     "both knees" (11/26/2013)  . Depression with anxiety 11/2013    "little bit"   . COPD (chronic obstructive pulmonary disease) ([place])   . Pneumonia 11/2013  . Diverticulosis of colon 1996  . CKD (chronic kidney disease)     GFR 55 11/2014   . GAVE (gastric antral vascular ectasia) 01/16/2015  . Hx of adenomatous colonic polyps    Past Surgical History  Procedure Laterality Date  . Nissen fundoplication      this ultimately failed and she underwent gastropexy  . Cholecystectomy    . Bladder suspension      .  bladder tack x 3.   . Abdominal hysterectomy    . Cataract extraction w/ intraocular lens  implant, bilateral Bilateral   . Appendectomy    . Hip arthroplasty Left 02/07/2014    Procedure: LEFT HIP PRESS FIT MONOPOLAR HEMIARTHROPLASTY ;  Surgeon: Marybelle Killings, MD;  Location: [facility];  Service: Orthopedics;  Laterality: Left;  . Esophagogastroduodenoscopy N/A 09/01/2014    Procedure: ESOPHAGOGASTRODUODENOSCOPY (EGD);  Surgeon: Lafayette Dragon, MD;  Location: [facility];  Service: Endoscopy;  Laterality: N/A;  . Gastropexy  1989  . Enterocele repair  2003    with colopexy.   . Colonoscopy N/A 01/16/2015    Procedure: COLONOSCOPY;  Surgeon: Gatha Mayer, MD;  Location: [facility] ENDOSCOPY;  Service: Endoscopy;  Laterality: N/A;  . Esophagogastroduodenoscopy N/A 01/16/2015    Procedure: ESOPHAGOGASTRODUODENOSCOPY (EGD);  Surgeon: Gatha Mayer, MD;  Location: [facility] ENDOSCOPY;  Service: Endoscopy;  Laterality: N/A;   Family History  Problem Relation Age of Onset  . Heart disease Mother   . Heart disease Father   . Heart disease Brother   . Bladder Cancer Sister   . Heart disease Brother   . Heart disease Brother    Social History  Substance Use Topics  . Smoking status: Former Smoker -- 0.25 packs/day for 40 years    Types: Cigarettes  . Smokeless tobacco: Never Used     Comment: "quit smoking ~ 2005"  . Alcohol Use: No     Comment: 11/26/2013 "drank a little alcohol; last drink was a long time ago"   OB History    No data available  Review of Systems  All other systems reviewed and are negative.     Allergies  Penicillins  Home Medications   Prior to Admission medications   Medication Sig Start Date End Date Taking? Authorizing Provider  ALPRAZolam Duanne Moron) 1 MG tablet Take 1 tablet (1 mg total) by mouth at bedtime. 01/14/16  Yes Tasrif Ahmed, MD  Calcium Carbonate-Vitamin D (CALTRATE 600+D) 600-400 MG-UNIT per tablet Take 1 tablet by mouth daily.   Yes Historical Provider, MD  conjugated estrogens (PREMARIN) vaginal cream Place 1 Applicatorful vaginally daily. 12/08/15  Yes Liberty Handy, MD  ferrous sulfate (FERROUSUL) 325 (65 FE) MG tablet Take 1 tablet (325 mg total) by mouth 2 (two) times daily with a meal. 01/16/15  Yes Ashly M Gottschalk, DO  gabapentin (NEURONTIN) 300 MG capsule Take 600 mg by mouth at bedtime.  05/12/14 01/15/16 Yes Historical  Provider, MD  levothyroxine (SYNTHROID, LEVOTHROID) 88 MCG tablet TAKE ONE TABLET BY MOUTH EVERY MORNING 05/12/14  Yes Historical Provider, MD  MAGNESIUM PO Take 1 tablet by mouth daily.    Yes Historical Provider, MD  metoprolol succinate (TOPROL-XL) 100 MG 24 hr tablet Take 1 tablet (100 mg total) by mouth daily. 01/14/16  Yes Tasrif Ahmed, MD  NEXIUM 40 MG capsule Take 40 mg by mouth 2 (two) times daily before a meal.  10/03/13  Yes Historical Provider, MD  Omega-3 Fatty Acids (FISH OIL PO) Take 1 capsule by mouth 2 (two) times daily.    Yes Historical Provider, MD  oxybutynin (DITROPAN-XL) 5 MG 24 hr tablet Take 1 tablet (5 mg total) by mouth at bedtime. 01/14/16  Yes Tasrif Ahmed, MD  polyethylene glycol (MIRALAX / GLYCOLAX) packet Take 17 g by mouth 2 (two) times daily. Patient taking differently: Take 17 g by mouth daily as needed.  02/10/14  Yes Thurnell Lose, MD  simvastatin (ZOCOR) 10 MG tablet Take 10 mg by mouth daily at 6 PM.  12/23/14  Yes Historical Provider, MD  sulfamethoxazole-trimethoprim (BACTRIM,SEPTRA) 400-80 MG per tablet Take 1 tablet by mouth daily.  12/26/14  Yes Historical Provider, MD   BP 180/86 mmHg  Pulse 75  Resp 12  SpO2 91% Physical Exam  Constitutional: She is oriented to person, place, and time. She appears well-developed and well-nourished.  HENT:  Head: Normocephalic and atraumatic.  Cardiovascular: Normal rate and regular rhythm.   No murmur heard. Pulmonary/Chest: Effort normal and breath sounds normal. No respiratory distress.  Abdominal: Soft. There is no tenderness. There is no rebound and no guarding.  Musculoskeletal:  2+ DP pulses bilaterally.  Moderate tenderness to palpation over the right groin and right lateral hip. RLE with external rotation and shortening  Neurological: She is alert and oriented to person, place, and time.  Skin: Skin is warm and dry.  Psychiatric: She has a normal mood and affect. Her behavior is normal.  Nursing note and  vitals reviewed.   ED Course  Procedures (including critical care time) Labs Review Labs Reviewed  COMPREHENSIVE METABOLIC PANEL - Abnormal; Notable for the following:    Sodium 131 (*)    Chloride 98 (*)    Glucose, Bld 101 (*)    Creatinine, Ser 1.06 (*)    GFR calc non Af Amer 48 (*)    GFR calc Af Amer 55 (*)    All other components within normal limits  URINALYSIS, ROUTINE W REFLEX MICROSCOPIC (NOT AT [facility]) - Abnormal; Notable for the following:    APPearance CLOUDY (*)  All other components within normal limits  CBC WITH DIFFERENTIAL/PLATELET    Imaging Review No results found. I have personally reviewed and evaluated these images and lab results as part of my medical decision-making.   EKG Interpretation   Date/Time:  Friday January 15 2016 14:15:45 EDT Ventricular Rate:  70 PR Interval:    QRS Duration: 110 QT Interval:  404 QTC Calculation: 436 R Axis:   48 Text Interpretation:  Sinus rhythm Atrial premature complex Confirmed by  Hazle Coca [phone]) on 01/15/2016 2:26:57 PM      MDM   Final diagnoses:  Femoral neck fracture, right, closed, initial encounter  Patient here for evaluation of injuries following a mechanical fall. She has local hip tenderness with lower extremity deformity concerning for fracture. X-rays pending.  Plain films demonstrate right femoral neck fracture. Discussed with Dr. Ninfa Linden with orthopedics who will see the patient in consult. Discussed with internal medicine teaching service, they agreed to admit the patient for further treatment.  Quintella Reichert, MD 01/15/16 [phone]

## 2016-01-15 NOTE — ED Notes (Signed)
Pt in from home via [facility] after fall at home. Pt was walking in kitchen, tripped and fell onto R side, no LOC reported and no head injury sustained. Per EMS, R hip has outward rotation and pain is 10/10. Pt was given 28mcg Fentanyl IV by EMS. Pt is alert, +oriented. +Movement & sensation in BLE's. Pt does have Afib, does not take blood thinners.

## 2016-01-15 NOTE — Anesthesia Preprocedure Evaluation (Addendum)
Anesthesia Evaluation  Patient identified by MRN, date of birth, ID band Patient awake    Reviewed: Allergy & Precautions, NPO status , Patient's Chart, lab work & pertinent test results, reviewed documented beta blocker date and time   History of Anesthesia Complications (+) PONV and history of anesthetic complications  Airway Mallampati: I  TM Distance: >3 FB Neck ROM: Full    Dental  (+) Edentulous Upper, Edentulous Lower   Pulmonary COPD, former smoker,    breath sounds clear to auscultation       Cardiovascular hypertension, Pt. on medications and Pt. on home beta blockers (-) angina Rhythm:Regular Rate:Normal  2/17 ECHO: EF [id number], grade 2 diastolic dysfunction, mild AI   Neuro/Psych negative neurological ROS     GI/Hepatic Neg liver ROS, hiatal hernia, GERD  Medicated and Controlled,  Endo/Other  Hypothyroidism   Renal/GU Renal InsufficiencyRenal disease (creat 1.06)     Musculoskeletal  (+) Arthritis , Osteoarthritis,    Abdominal   Peds  Hematology negative hematology ROS (+)   Anesthesia Other Findings   Reproductive/Obstetrics                            Anesthesia Physical Anesthesia Plan  ASA: III  Anesthesia Plan: General   Post-op Pain Management:    Induction: Intravenous  Airway Management Planned: Oral ETT  Additional Equipment:   Intra-op Plan:   Post-operative Plan: Extubation in OR  Informed Consent: I have reviewed the patients History and Physical, chart, labs and discussed the procedure including the risks, benefits and alternatives for the proposed anesthesia with the patient or authorized representative who has indicated his/her understanding and acceptance.     Plan Discussed with: CRNA and Surgeon  Anesthesia Plan Comments: (Plan routine monitors, GETA)        Anesthesia Quick Evaluation

## 2016-01-15 NOTE — ED Notes (Signed)
Xray called and notified patient may be taken to Xray

## 2016-01-15 NOTE — Transfer of Care (Signed)
Immediate Anesthesia Transfer of Care Note  Patient: Carolyn [name]  Procedure(s) Performed: Procedure(s): ANTERIOR APPROACH HEMI HIP ARTHROPLASTY (Right)  Patient Location: PACU  Anesthesia Type:General  Level of Consciousness: awake, alert  and oriented  Airway & Oxygen Therapy: Patient Spontanous Breathing and Patient connected to nasal cannula oxygen  Post-op Assessment: Report given to RN and Post -op Vital signs reviewed and stable  Post vital signs: Reviewed and stable  Last Vitals:  Filed Vitals:   01/15/16 1615 01/15/16 1630  BP: 186/98 178/100  Pulse: 68 67  Resp: 15 12    Last Pain:  Filed Vitals:   01/15/16 1928  PainSc: 9       Patients Stated Pain Goal: 1 (0000000 [id number])  Complications: No apparent anesthesia complications

## 2016-01-15 NOTE — H&P (Signed)
Date: 01/15/2016               Patient Name:  Carolyn [name] MRN: [id number]  DOB: 1930-11-22 Age / Sex: 82 y.o., female   PCP: Aldine Contes, MD         Medical Service: Internal Medicine Teaching Service         Attending Physician: Dr. Axel Filler, MD    First Contact: Dr. Tiburcio Pea Pager: [phone]  Second Contact: Dr. Posey Pronto Pager: [phone]       After Hours (After 5p/  First Contact Pager: [phone]  weekends / holidays): Second Contact Pager: [phone]   Chief Complaint: fall  History of Present Illness:   82 yo patient with HTN, hypothyroidism, HLD, Depression, ?COPD who presents after a fall resulting in a hip fracture.  She says at around 12 PM, she was in the kitchen sink, and was walking and thinks she tripped her legs and fell. She was wearing her tennis shoes so does not believe she slipped on the floor. She fell forwards and hit her lead, but no LOC.   Right prior to the fall, patient denies any dizziness, spinning, palpitations, and no LOC. She was talking with her great grand daughter when she fell and at home there was no one. She laid on the floor for about 30 minutes before her family arrived.   She is pretty active at baseline and does all her ADLs by herself. She uses a cane for ambulation, and she denies any prior history of falls int he last 3 months. She has had left hip replacement 2 years ago by Dr Lorin Mercy. That time she preferred home health PT. Daughter says she will move in with her after the surgery for recovery.   Patient denies fevers, chills, n/v/d,. Other ROS negative. Denies SOB.   Currently she describes her pain as 7/10 inr ight hip.   FH: Afib in daughter, Mi in brother  SH: former smoker with around 30 pack year smoking history. No alcohol use or illicit drug use           Used to work for [employer] and now retired.    Meds: Current Facility-Administered Medications  Medication Dose Route Frequency Provider Last Rate Last Dose  .  0.9 %  sodium chloride infusion   Intravenous Continuous Burgess Estelle, MD      . acetaminophen (TYLENOL) tablet 650 mg  650 mg Oral Q6H PRN Burgess Estelle, MD       Or  . acetaminophen (TYLENOL) suppository 650 mg  650 mg Rectal Q6H PRN Burgess Estelle, MD      . docusate sodium (COLACE) capsule 100 mg  100 mg Oral BID Burgess Estelle, MD      . ondansetron (ZOFRAN) tablet 4 mg  4 mg Oral Q6H PRN Burgess Estelle, MD       Or  . ondansetron (ZOFRAN) injection 4 mg  4 mg Intravenous Q6H PRN Burgess Estelle, MD      . senna (SENOKOT) tablet 8.6 mg  1 tablet Oral BID Burgess Estelle, MD      . sodium chloride flush (NS) 0.9 % injection 3 mL  3 mL Intravenous Q12H Burgess Estelle, MD        Allergies: Allergies as of 01/15/2016 - Review Complete 01/15/2016  Allergen Reaction Noted  . Penicillins Hives 11/26/2013   Past Medical History  Diagnosis Date  . Hypertension   . High cholesterol   . Hypothyroidism   .  Asthma     "only when I smoked"  . Hiatal hernia     s/p gastropexy after failed Nissen fundoplication.   . Barrett's esophagus since at least 1985  . Arthritis 11/2013.     "both knees" (11/26/2013)  . Depression with anxiety 11/2013    "little bit"   . COPD (chronic obstructive pulmonary disease) ([place])   . Pneumonia 11/2013  . Diverticulosis of colon 1996  . CKD (chronic kidney disease)     GFR 55 11/2014   . GAVE (gastric antral vascular ectasia) 01/16/2015  . Hx of adenomatous colonic polyps    Past Surgical History  Procedure Laterality Date  . Nissen fundoplication      this ultimately failed and she underwent gastropexy  . Cholecystectomy    . Bladder suspension      .  bladder tack x 3.   . Abdominal hysterectomy    . Cataract extraction w/ intraocular lens  implant, bilateral Bilateral   . Appendectomy    . Hip arthroplasty Left 02/07/2014    Procedure: LEFT HIP PRESS FIT MONOPOLAR HEMIARTHROPLASTY ;  Surgeon: Marybelle Killings, MD;  Location: [facility];  Service: Orthopedics;   Laterality: Left;  . Esophagogastroduodenoscopy N/A 09/01/2014    Procedure: ESOPHAGOGASTRODUODENOSCOPY (EGD);  Surgeon: Lafayette Dragon, MD;  Location: [facility];  Service: Endoscopy;  Laterality: N/A;  . Gastropexy  1989  . Enterocele repair  2003    with colopexy.   . Colonoscopy N/A 01/16/2015    Procedure: COLONOSCOPY;  Surgeon: Gatha Mayer, MD;  Location[facility] END[facility]COPY;  Service: Endoscopy;  Laterality: N/A;  . Esophagogastroduodenoscopy N/A 01/16/2015    Procedure: ESOPHAGOGASTRODUODENOSCOPY (EGD);  Surgeon: Gatha Mayer, MD;  Location: [facility] ENDOSCOPY;  Service: Endoscopy;  Laterality: N/A;   Family History  Problem Relation Age of Onset  . Heart disease Mother   . Heart disease Father   . Heart disease Brother   . Bladder Cancer Sister   . Heart disease Brother   . Stomach cancer Mother    Social History   Social History  . Marital Status: Widowed    Spouse Name: N/A  . Number of Children: 3  . Years of Education: 8th   Occupational History  . Retired [place] Topics  . Smoking status: Former Smoker -- 0.25 packs/day for 40 years    Types: Cigarettes  . Smokeless tobacco: Never Used     Comment: "quit smoking ~ 2005" but was on/off during that time  . Alcohol Use: No     Comment: 11/26/2013 "drank a little alcohol; last drink was a long time ago"  . Drug Use: No  . Sexual Activity: No   Other Topics Concern  . Not on file   Social History Narrative   Lives at home by herself with close support   Used to be a weaver but retired in [place]: Pertinent items noted in HPI and remainder of comprehensive ROS otherwise negative.  Physical Exam: Blood pressure 178/100, pulse 67, resp. rate 12, SpO2 94 %.  General:  A&O, in NAD.  CV: IRRegular rate, normal s1, s2, no murmurs or rubs appreciated. Resp: equal and symmetric breath sounds, no wheezing or crackles. Abdomen: soft, nontender, nondistended, +BS in all 4  quadrants Skin: warm, dry, intact, no open lesions or atypical rash noted Extremities: pulses intact b/l           Right hip tender  to palpation Neurologic: Patient is alert and oriented     Lab results: Results for orders placed or performed during the hospital encounter of 01/15/16 (from the past 24 hour(s))  Urinalysis, Routine w reflex microscopic     Status: Abnormal   Collection Time: 01/15/16  2:41 PM  Result Value Ref Range   Color, Urine YELLOW YELLOW   APPearance CLOUDY (A) CLEAR   Specific Gravity, Urine 1.022 1.005 - 1.030   pH 7.0 5.0 - 8.0   Glucose, UA NEGATIVE NEGATIVE mg/dL   Hgb urine dipstick NEGATIVE NEGATIVE   Bilirubin Urine NEGATIVE NEGATIVE   Ketones, ur NEGATIVE NEGATIVE mg/dL   Protein, ur NEGATIVE NEGATIVE mg/dL   Nitrite NEGATIVE NEGATIVE   Leukocytes, UA NEGATIVE NEGATIVE  Comprehensive metabolic panel     Status: Abnormal   Collection Time: 01/15/16  2:50 PM  Result Value Ref Range   Sodium 131 (L) 135 - 145 mmol/L   Potassium 4.8 3.5 - 5.1 mmol/L   Chloride 98 (L) 101 - 111 mmol/L   CO2 26 22 - 32 mmol/L   Glucose, Bld 101 (H) 65 - 99 mg/dL   BUN 13 6 - 20 mg/dL   Creatinine, Ser 1.06 (H) 0.44 - 1.00 mg/dL   Calcium 10.0 8.9 - 10.3 mg/dL   Total Protein 7.2 6.5 - 8.1 g/dL   Albumin 3.6 3.5 - 5.0 g/dL   AST 29 15 - 41 U/L   ALT 14 14 - 54 U/L   Alkaline Phosphatase 67 38 - 126 U/L   Total Bilirubin 0.5 0.3 - 1.2 mg/dL   GFR calc non Af Amer 48 (L) >60 mL/min   GFR calc Af Amer 55 (L) >60 mL/min   Anion gap 7 5 - 15  CBC with Differential     Status: None   Collection Time: 01/15/16  2:50 PM  Result Value Ref Range   WBC 5.3 4.0 - 10.5 K/uL   RBC 4.36 3.87 - 5.11 MIL/uL   Hemoglobin 13.8 12.0 - 15.0 g/dL   HCT 41.6 36.0 - 46.0 %   MCV 95.4 78.0 - 100.0 fL   MCH 31.7 26.0 - 34.0 pg   MCHC 33.2 30.0 - 36.0 g/dL   RDW 13.4 11.5 - 15.5 %   Platelets 193 150 - 400 K/uL   Neutrophils Relative % 70 %   Neutro Abs 3.7 1.7 - 7.7 K/uL    Lymphocytes Relative 21 %   Lymphs Abs 1.1 0.7 - 4.0 K/uL   Monocytes Relative 8 %   Monocytes Absolute 0.4 0.1 - 1.0 K/uL   Eosinophils Relative 1 %   Eosinophils Absolute 0.1 0.0 - 0.7 K/uL   Basophils Relative 0 %   Basophils Absolute 0.0 0.0 - 0.1 K/uL     Imaging results:  Dg Chest 1 View  01/15/2016  CLINICAL DATA:  Patient fell today.  Hip pain. EXAM: CHEST 1 VIEW COMPARISON:  07/14/2015 FINDINGS: Numerous naps overlie the chest. Lungs are hyperexpanded. Atelectasis or scarring noted left base. Interstitial markings are diffusely coarsened with chronic features. The cardiopericardial silhouette is within normal limits for size. Small hiatal hernia noted. Bones are diffusely demineralized. IMPRESSION: Emphysema with atelectasis or scarring at the left base. Small hiatal hernia. Electronically Signed   By: Misty Stanley M.D.   On: 01/15/2016 16:17   Dg Hip Unilat With Pelvis 2-3 Views Right  01/15/2016  CLINICAL DATA:  RIGHT hip pain after falling today, initial encounter EXAM: DG HIP (WITH OR  WITHOUT PELVIS) 2-3V RIGHT COMPARISON:  None FINDINGS: Marked osseous demineralization. LEFT hip prosthesis. SI joints preserved. Displaced subcapital RIGHT femoral neck fracture. No dislocation. Pelvis appears intact. Scattered atherosclerotic calcifications aorta and iliac arteries. IMPRESSION: Osseous demineralization with displaced subcapital fracture RIGHT femoral neck. Prior LEFT hip replacement. Aortic atherosclerosis. Electronically Signed   By: Lavonia Dana M.D.   On: 01/15/2016 16:19    Other results: EKG: PACs Assessment & Plan by Problem: Active Problems:   Hip fracture (HCC)   Hypothyroidism   PAC (premature atrial contraction)  Right femoral neck fracture: orthopaedics consulted, came when we were in the room. Plan for right hemiarthroplasty this evening. H/H stable  -NPO -surgery today -NS 100 cc/hr -post-op pain control per orthopaedic -PT OT -AM CBC and  BMET -senokot -zofran PRN  HTN: on metop 50 mg daily. At last clinic note yesterday, it was increased to 100 mg daily. Patient has not started the new dose.  -metoprolol 50 mg daily   HypoThyroidism:- -synthroid 88 mcg daily  Urge incontinence and history of recurrent UTI- bactrim stopped yesterday -oxybutynin 5 mg qhs   Depression/anxiety- per clinic note, tapering xanax. Recd #10 for this month -holding xanax  Dispo: Disposition is deferred at this time, awaiting improvement of current medical problems. Anticipated discharge in approximately 1 day(s).   The patient does have a current PCP (Aldine Contes, MD) and does need an [facility] hospital follow-up appointment after discharge.  The patient does not have transportation limitations that hinder transportation to clinic appointments.  Signed: Burgess Estelle, MD 01/15/2016, 5:17 PM

## 2016-01-15 NOTE — Brief Op Note (Signed)
01/15/2016  10:08 PM  PATIENT:  [name]  82 y.o. female  PRE-OPERATIVE DIAGNOSIS:  Right Hip Femoral Neck Fracture  POST-OPERATIVE DIAGNOSIS:  Right Hip Femoral Neck Fracture  PROCEDURE:  Procedure(s): ANTERIOR APPROACH HEMI HIP ARTHROPLASTY (Right)  SURGEON:  Surgeon(s) and Role:    * Mcarthur Rossetti, MD - Primary  PHYSICIAN ASSISTANT: Benita Stabile, PA-C  ANESTHESIA:   general  EBL:  Total I/O In: -  Out: [id number] [Urine:835; Blood:100]  COUNTS:  YES  DICTATION: .Other Dictation: Dictation Number [id number]  PLAN OF CARE: Admit to inpatient   PATIENT DISPOSITION:  PACU - hemodynamically stable.   Delay start of Pharmacological VTE agent (>24hrs) due to surgical blood loss or risk of bleeding: no

## 2016-01-15 NOTE — Anesthesia Procedure Notes (Signed)
Date/Time: 01/15/2016 8:27 PM Performed by: Lelon Perla A Patient Re-evaluated:Patient Re-evaluated prior to inductionOxygen Delivery Method: Circle system utilized Preoxygenation: Pre-oxygenation with 100% oxygen Intubation Type: IV induction, Rapid sequence and Cricoid Pressure applied Grade View: Grade I Tube type: Oral Tube size: 7.0 mm Number of attempts: 1 Airway Equipment and Method: Stylet Placement Confirmation: ETT inserted through vocal cords under direct vision,  positive ETCO2 and breath sounds checked- equal and bilateral Secured at: 22 cm Tube secured with: Tape

## 2016-01-16 ENCOUNTER — Inpatient Hospital Stay (HOSPITAL_COMMUNITY): Payer: Medicare Other

## 2016-01-16 DIAGNOSIS — Z86718 Personal history of other venous thrombosis and embolism: Secondary | ICD-10-CM

## 2016-01-16 DIAGNOSIS — Z96641 Presence of right artificial hip joint: Secondary | ICD-10-CM

## 2016-01-16 DIAGNOSIS — I10 Essential (primary) hypertension: Secondary | ICD-10-CM

## 2016-01-16 DIAGNOSIS — W19XXXD Unspecified fall, subsequent encounter: Secondary | ICD-10-CM

## 2016-01-16 DIAGNOSIS — Z7901 Long term (current) use of anticoagulants: Secondary | ICD-10-CM

## 2016-01-16 LAB — BASIC METABOLIC PANEL
ANION GAP: 8 (ref 5–15)
BUN: 10 mg/dL (ref 6–20)
CALCIUM: 8.9 mg/dL (ref 8.9–10.3)
CO2: 26 mmol/L (ref 22–32)
Chloride: 98 mmol/L — ABNORMAL LOW (ref 101–111)
Creatinine, Ser: 0.89 mg/dL (ref 0.44–1.00)
GFR calc Af Amer: >60 mL/min (ref >60)
GFR calc non Af Amer: 59 mL/min — ABNORMAL LOW (ref >60)
GLUCOSE: 119 mg/dL — AB (ref 65–99)
POTASSIUM: 4 mmol/L (ref 3.5–5.1)
Sodium: 132 mmol/L — ABNORMAL LOW (ref 135–145)

## 2016-01-16 LAB — CBC
HEMATOCRIT: 38.9 % (ref 36.0–46.0)
HEMOGLOBIN: 12.7 g/dL (ref 12.0–15.0)
MCH: 31.2 pg (ref 26.0–34.0)
MCHC: 32.6 g/dL (ref 30.0–36.0)
MCV: 95.6 fL (ref 78.0–100.0)
Platelets: 176 10*3/uL (ref 150–400)
RBC: 4.07 MIL/uL (ref 3.87–5.11)
RDW: 13.3 % (ref 11.5–15.5)
WBC: 10.9 10*3/uL — ABNORMAL HIGH (ref 4.0–10.5)

## 2016-01-16 LAB — PROTIME-INR
INR: 1.06 (ref 0.00–1.49)
Prothrombin Time: 14 seconds (ref 11.6–15.2)

## 2016-01-16 LAB — SURGICAL PCR SCREEN
MRSA, PCR: NEGATIVE
STAPHYLOCOCCUS AUREUS: NEGATIVE

## 2016-01-16 LAB — APTT: aPTT: 33 seconds (ref 24–37)

## 2016-01-16 LAB — GLUCOSE, CAPILLARY: GLUCOSE-CAPILLARY: 118 mg/dL — AB (ref 65–99)

## 2016-01-16 MED ORDER — MIRTAZAPINE 15 MG PO TABS
7.5000 mg | ORAL_TABLET | Freq: Once | ORAL | Status: AC
  Administered 2016-01-16: 7.5 mg via ORAL
  Filled 2016-01-16: qty 1

## 2016-01-16 MED ORDER — RIVAROXABAN 10 MG PO TABS
10.0000 mg | ORAL_TABLET | Freq: Every day | ORAL | Status: DC
  Administered 2016-01-16: 10 mg via ORAL
  Filled 2016-01-16 (×2): qty 1

## 2016-01-16 MED ORDER — MIRTAZAPINE 15 MG PO TABS: 7.5000 mg | ORAL_TABLET | Freq: Every day | ORAL | Status: DC

## 2016-01-16 MED ORDER — ALUM & MAG HYDROXIDE-SIMETH 200-200-20 MG/5ML PO SUSP
15.0000 mL | Freq: Once | ORAL | Status: AC
  Administered 2016-01-16: 15 mL via ORAL
  Filled 2016-01-16: qty 30

## 2016-01-16 MED ORDER — RAMELTEON 8 MG PO TABS
8.0000 mg | ORAL_TABLET | Freq: Every day | ORAL | Status: DC
  Administered 2016-01-16: 8 mg via ORAL
  Filled 2016-01-16 (×3): qty 1

## 2016-01-16 MED ORDER — ALPRAZOLAM 0.5 MG PO TABS
1.0000 mg | ORAL_TABLET | Freq: Once | ORAL | Status: AC
  Administered 2016-01-16: 1 mg via ORAL
  Filled 2016-01-16: qty 2

## 2016-01-16 MED ORDER — CHOLECALCIFEROL 10 MCG (400 UNIT) PO TABS
800.0000 [IU] | ORAL_TABLET | Freq: Every day | ORAL | Status: DC
  Administered 2016-01-16 – 2016-01-18 (×2): 800 [IU] via ORAL
  Filled 2016-01-16 (×3): qty 2

## 2016-01-16 NOTE — Progress Notes (Signed)
Physical Therapy Evaluation Patient Details Name: Carolyn [name] MRN: [id number] DOB: Sep 22, 1930 Today's Date: 01/16/2016   History of Present Illness  82 yo female post fall in kitchen and R hip fracture, presents post direct anterior approach for hip hemiarthroplasty  Clinical Impression  Patient presents with pain following surgery, limiting mobility.  Overall MOD assist for Bed mobility and transfers with cues for safety and technique.  Patient performed better than she thought she might, is anxious about a nursing home, wants to go directly home with Home Health therapy, but has steps to enter home, and lives alone.  May revisit DC location based on progress.  Patient is good rehab candidate, and will benefit from continuation of PT care.     Follow Up Recommendations CIR;SNF;Supervision/Assistance - 24 hour    Equipment Recommendations  None recommended by PT    Recommendations for Other Services Rehab consult     Precautions / Restrictions Precautions Precautions: Anterior Hip;Fall Precaution Booklet Issued: Yes (comment) Restrictions Weight Bearing Restrictions: Yes RLE Weight Bearing: Weight bearing as tolerated      Mobility  Bed Mobility Overal bed mobility: Needs Assistance Bed Mobility: Supine to Sit     Supine to sit: Mod assist     General bed mobility comments: Slow pace for pain, cues for technique  Transfers Overall transfer level: Needs assistance Equipment used: Rolling walker (2 wheeled) Transfers: Sit to/from [facility] Sit to Stand: Mod assist Stand pivot transfers: Mod assist       General transfer comment: Pain, cues for technique  Ambulation/Gait Ambulation/Gait assistance: Min assist Ambulation Distance (Feet): 5 Feet Assistive device: Rolling walker (2 wheeled) Gait Pattern/deviations: Step-to pattern;Antalgic     General Gait Details: Limited by 'burning' sensation in Rt leg with activity.  Stairs             Wheelchair Mobility    Modified Rankin (Stroke Patients Only)       Balance Overall balance assessment: Needs assistance   Sitting balance-Leahy Scale: Fair       Standing balance-Leahy Scale: Poor                               Pertinent Vitals/Pain Pain Assessment: 0-10 Pain Score: 5  Pain Location: Rt hip Pain Descriptors / Indicators: Aching;Sore Pain Intervention(s): Monitored during session;Limited activity within patient's tolerance    Home Living Family/patient expects to be discharged to:: Private residence Living Arrangements: Alone (Children visit daily per patient) Available Help at Discharge: Family Type of Home: House Home Access: Stairs to enter Entrance Stairs-Rails: Right Entrance Stairs-Number of Steps: 2 Home Layout: One level Home Equipment: Cane - single point;Walker - 2 wheels;Bedside commode;Hand held shower head Additional Comments: Patient reports having [facility] therapy before, and would like to go home with [facility] therapy vs SNF    Prior Function Level of Independence: Independent         Comments: has cane and walker, but does not need them always     Hand Dominance        Extremity/Trunk Assessment   Upper Extremity Assessment: Overall WFL for tasks assessed;Defer to OT evaluation           Lower Extremity Assessment: Overall WFL for tasks assessed         Communication   Communication: No difficulties  Cognition Arousal/Alertness: Awake/alert Behavior During Therapy: WFL for tasks assessed/performed Overall Cognitive Status: Within Functional Limits for tasks assessed  General Comments General comments (skin integrity, edema, etc.): Burning sensation in Rt leg with activity, anticipate resolves with time    Exercises Total Joint Exercises Ankle Circles/Pumps: AAROM;Both;10 reps;Supine Heel Slides: AAROM;Both;10 reps;Supine Hip ABduction/ADduction: AAROM;Left;10  reps;Supine Straight Leg Raises: AAROM;Both;10 reps;Supine      Assessment/Plan    PT Assessment Patient needs continued PT services  PT Diagnosis Difficulty walking;Generalized weakness;Acute pain   PT Problem List Decreased strength;Decreased activity tolerance;Decreased balance;Decreased mobility;Decreased safety awareness;Decreased knowledge of use of DME;Pain  PT Treatment Interventions Gait training;Stair training;Functional mobility training;Therapeutic activities;Therapeutic exercise;Patient/family education   PT Goals (Current goals can be found in the Care Plan section) Acute Rehab PT Goals Patient Stated Goal: To go home, get therapy at home PT Goal Formulation: With patient Time For Goal Achievement: 01/30/16 Potential to Achieve Goals: Good    Frequency 7X/week   Barriers to discharge Inaccessible home environment;Decreased caregiver support 2 steps to enter home, lives alone with daily visits from children    Co-evaluation               End of Session Equipment Utilized During Treatment: Gait belt Activity Tolerance: Patient tolerated treatment well Patient left: in chair;with call bell/phone within reach Nurse Communication: Mobility status;Precautions         Time: 1140-1220 PT Time Calculation (min) (ACUTE ONLY): 40 min   Charges:   PT Evaluation $PT Eval Low Complexity: 1 Procedure PT Treatments $Therapeutic Exercise: 8-22 mins $Therapeutic Activity: 8-22 mins   PT G CodesZenia Resides, Atasha Colebank L 2016/02/13, 1:53 PM

## 2016-01-16 NOTE — Discharge Instructions (Addendum)
. °  Orthopedics Instructions: Full weight bearing as tolerated on your right hip. Can get the hip dressing wet daily in the shower.  Medicine Instructions: We have started once a week Fosamax for your bone health- please follow the directions on the medicine bottle We also started Vitamin D as it has been shown to reduce fall risk Please take Xarelto until July 29th. Please continue the xanax taper as scheduled by Dr Genene Churn  Information on my medicine - XARELTO (Rivaroxaban)  This medication education was reviewed with me or my healthcare representative as part of my discharge preparation.  The pharmacist that spoke with me during my hospital stay was:  Jaquita Folds, [facility]  Information on my medicine - XARELTO (Rivaroxaban)  Why was Xarelto prescribed for you? Xarelto was prescribed for you to reduce the risk of blood clots forming after orthopedic surgery. The medical term for these abnormal blood clots is venous thromboembolism (VTE).  What do you need to know about xarelto ? Take your Xarelto ONCE DAILY at the same time every day. You may take it either with or without food.  If you have difficulty swallowing the tablet whole, you may crush it and mix in applesauce just prior to taking your dose.  Take Xarelto exactly as prescribed by your doctor and DO NOT stop taking Xarelto without talking to the doctor who prescribed the medication.  Stopping without other VTE prevention medication to take the place of Xarelto may increase your risk of developing a clot.  After discharge, you should have regular check-up appointments with your healthcare provider that is prescribing your Xarelto.    What do you do if you miss a dose? If you miss a dose, take it as soon as you remember on the same day then continue your regularly scheduled once daily regimen the next day. Do not take two doses of Xarelto on the same day.   Important Safety Information A possible side effect of  Xarelto is bleeding. You should call your healthcare provider right away if you experience any of the following: ? Bleeding from an injury or your nose that does not stop. ? Unusual colored urine (red or dark brown) or unusual colored stools (red or black). ? Unusual bruising for unknown reasons. ? A serious fall or if you hit your head (even if there is no bleeding).  Some medicines may interact with Xarelto and might increase your risk of bleeding while on Xarelto. To help avoid this, consult your healthcare provider or pharmacist prior to using any new prescription or non-prescription medications, including herbals, vitamins, non-steroidal anti-inflammatory drugs (NSAIDs) and supplements.  This website has more information on Xarelto: [email or web address]

## 2016-01-16 NOTE — Progress Notes (Signed)
Subjective: 1 Day Post-Op Procedure(s) (LRB): ANTERIOR APPROACH HEMI HIP ARTHROPLASTY (Right) Patient reports pain as moderate.  Complaining of heartburn this AM. No chest pain or SOB.  Objective: Vital signs in last 24 hours: Temp:  [97.7 F (36.5 C)-98.4 F (36.9 C)] 98.1 F (36.7 C) (06/24 0429) Pulse Rate:  [67-87] 80 (06/24 0429) Resp:  [11-17] 16 (06/24 0429) BP: (127-186)/(71-111) 127/71 mmHg (06/24 0429) SpO2:  [91 %-100 %] 93 % (06/24 0429)  Intake/Output from previous day: 06/23 0701 - 06/24 0700 In: 800 [I.V.:800] Out: 1435 [Urine:1335; Blood:100] Intake/Output this shift: Total I/O In: 1112.5 [P.O.:25; I.V.:987.5; IV Piggyback:100] Out: 375 [Urine:375]   Recent Labs  01/15/16 1450 01/15/16 1819 01/16/16 0311  HGB 13.8 13.6 12.7    Recent Labs  01/15/16 1819 01/16/16 0311  WBC 10.1 10.9*  RBC 4.29 4.07  HCT 41.0 38.9  PLT 209 176    Recent Labs  01/15/16 1450 01/16/16 0311  NA 131* 132*  K 4.8 4.0  CL 98* 98*  CO2 26 26  BUN 13 10  CREATININE 1.06* 0.89  GLUCOSE 101* 119*  CALCIUM 10.0 8.9    Recent Labs  01/16/16 0311  INR 1.06   Right lower extremity: Sensation intact distally Intact pulses distally Dorsiflexion/Plantar flexion intact Incision: dressing C/D/I Compartment soft  Assessment/Plan: 1 Day Post-Op Procedure(s) (LRB): ANTERIOR APPROACH HEMI HIP ARTHROPLASTY (Right) Up with therapy  On Protonix will monitor reflux .    GILBERT CLARK 01/16/2016, 8:42 AM

## 2016-01-16 NOTE — Progress Notes (Signed)
Subjective:  Patient underwent hip hemiarthroplasty last night. Says her pain is well controlled. She was interactive and eating breakfast.   Objective: Vital signs in last 24 hours: Filed Vitals:   01/15/16 2253 01/15/16 2254 01/15/16 2315 01/16/16 0429  BP: 168/93  170/77 127/71  Pulse: 77  87 80  Temp:  98.4 F (36.9 C) 97.7 F (36.5 C) 98.1 F (36.7 C)  TempSrc:   Oral Oral  Resp: 11  15 16   SpO2: 100%  92% 93%   Weight change:   Intake/Output Summary (Last 24 hours) at 01/16/16 0929 Last data filed at 01/16/16 0815  Gross per 24 hour  Intake 1912.5 ml  Output   1810 ml  Net  102.5 ml   General: Vital signs reviewed. Patient in no acute distress Cardiovascular: regular rate, rhythm, no murmur appreciated  Abdominal: Soft, non-tender, non-distended, BS + Extremities: No lower extremity edema bilaterally, pulses symmetric and intact bilaterally. Right hip- appropriately ttp    Lab Results: reviewed Micro Results: Recent Results (from the past 240 hour(s))  Surgical pcr screen     Status: None   Collection Time: 01/15/16  9:08 PM  Result Value Ref Range Status   MRSA, PCR NEGATIVE NEGATIVE Final   Staphylococcus aureus NEGATIVE NEGATIVE Final    Comment:        The Xpert SA Assay (FDA approved for NASAL specimens in patients over 49 years of age), is one component of a comprehensive surveillance program.  Test performance has been validated by [facility] for patients greater than or equal to 52 year old. It is not intended to diagnose infection nor to guide or monitor treatment.    Studies/Results: Dg Chest 1 View  01/15/2016  CLINICAL DATA:  Patient fell today.  Hip pain. EXAM: CHEST 1 VIEW COMPARISON:  07/14/2015 FINDINGS: Numerous naps overlie the chest. Lungs are hyperexpanded. Atelectasis or scarring noted left base. Interstitial markings are diffusely coarsened with chronic features. The cardiopericardial silhouette is within normal limits for  size. Small hiatal hernia noted. Bones are diffusely demineralized. IMPRESSION: Emphysema with atelectasis or scarring at the left base. Small hiatal hernia. Electronically Signed   By: Misty Stanley M.D.   On: 01/15/2016 16:17   Pelvis Portable  01/15/2016  CLINICAL DATA:  Status post right hip surgery.  Initial encounter. EXAM: PORTABLE PELVIS 1-2 VIEWS COMPARISON:  Pelvis radiograph performed 02/07/2014 FINDINGS: The patient is status post right hip arthroplasty. The arthroplasty is noted in expected position, without evidence of loosening or new fracture. Overlying skin staples and scattered soft tissue air are seen. The patient's left hip arthroplasty is unremarkable in appearance. The visualized bowel gas pattern is grossly unremarkable in appearance. IMPRESSION: Status post right hip arthroplasty.  No evidence of new fracture. Electronically Signed   By: Garald Balding M.D.   On: 01/15/2016 22:52   Dg Hip Operative Unilat W Or W/o Pelvis Right  01/16/2016  CLINICAL DATA:  Right hip hemiarthroplasty.  Initial encounter. EXAM: OPERATIVE RIGHT HIP (WITH PELVIS IF PERFORMED) 1 VIEW TECHNIQUE: Fluoroscopic spot image(s) were submitted for interpretation post-operatively. COMPARISON:  Right hip radiographs performed earlier today at 3:29 p.m. FINDINGS: Five fluoroscopic C-arm images are provided from the OR, demonstrating placement of a right hip hemiarthroplasty. Overlying postoperative soft tissue air is noted. No new fractures are seen. The visualized portions of the patient's left hip hemiarthroplasty is grossly unremarkable. IMPRESSION: Status post placement of right hip hemiarthroplasty. No new fracture seen. Electronically Signed   By:  Garald Balding M.D.   On: 01/16/2016 00:56   Dg Hip Unilat With Pelvis 2-3 Views Right  01/15/2016  CLINICAL DATA:  RIGHT hip pain after falling today, initial encounter EXAM: DG HIP (WITH OR WITHOUT PELVIS) 2-3V RIGHT COMPARISON:  None FINDINGS: Marked osseous  demineralization. LEFT hip prosthesis. SI joints preserved. Displaced subcapital RIGHT femoral neck fracture. No dislocation. Pelvis appears intact. Scattered atherosclerotic calcifications aorta and iliac arteries. IMPRESSION: Osseous demineralization with displaced subcapital fracture RIGHT femoral neck. Prior LEFT hip replacement. Aortic atherosclerosis. Electronically Signed   By: Lavonia Dana M.D.   On: 01/15/2016 16:19   Medications: I have reviewed the patient's current medications. Scheduled Meds: . alum & mag hydroxide-simeth  15 mL Oral Once  . docusate sodium  100 mg Oral BID  . ferrous sulfate  325 mg Oral Q breakfast  . gabapentin  600 mg Oral QHS  . levothyroxine  88 mcg Oral QAC breakfast  . metoprolol succinate  50 mg Oral Daily  . oxybutynin  5 mg Oral QHS  . pantoprazole  80 mg Oral Q1200  . senna-docusate  2 tablet Oral QHS  . simvastatin  10 mg Oral q1800  . sodium chloride flush  3 mL Intravenous Q12H   Continuous Infusions:  PRN Meds:.acetaminophen **OR** acetaminophen, HYDROcodone-acetaminophen, menthol-cetylpyridinium **OR** phenol, methocarbamol **OR** methocarbamol (ROBAXIN)  IV, metoCLOPramide **OR** metoCLOPramide (REGLAN) injection, morphine injection, ondansetron **OR** ondansetron (ZOFRAN) IV Assessment/Plan: Principal Problem:   Fracture of femoral neck, right (HCC) Active Problems:   Hypothyroidism   Essential hypertension   Barrett's esophagus   PAC (premature atrial contraction)   Recurrent UTI vs overactive bladder   Right femoral neck fracture: underwent hip hemiarthroplasty overnight.  -Orthopedics following and appreciates recs -PT OT -PRN reglan -resume rgular diet  -incentive spirometer -start xarelto for DVT prophylaxis  History of DVT: Patient was on Eliquis at some point int he past, and given her history of DVT, we recommend full dose anticoagulation for 35 days postop, and so we have discontinued aspirin and will start  anticoagulation. This calculated using CAPRINI score. However, she has a history of GAVE as shown in the EGD. So she has moderate risk of bleeding but her risk of DVT outweighs the potential risk of bleeding  -anticoagulation with xarelto 10 mg once daily for 35 days- start 6/24, end July 29 -monitor CBC   HTN: blood pressure has been stable  -metoprolol 50 mg daily  Fall resulting in a fracture: -recommend bisphosphonate therapy on discharge -check vitamin D -started 800 IU vitamin D    Dispo: Disposition is deferred at this time, awaiting improvement of current medical problems.  Anticipated discharge in approximately 1-2 day(s).   The patient does have a current PCP (Aldine Contes, MD) and does need an [facility] hospital follow-up appointment after discharge.  The patient does not have transportation limitations that hinder transportation to clinic appointments.  .Services Needed at time of discharge: Y = Yes, Blank = No PT:   OT:   RN:   Equipment:   Other:     LOS: 1 day   Carolyn Estelle, MD 01/16/2016, 9:29 AM

## 2016-01-16 NOTE — Progress Notes (Signed)
Occupational Therapy Evaluation Patient Details Name: Carolyn [name] MRN: [id number] DOB: 1930-09-27 Today's Date: 01/16/2016    History of Present Illness 82 yo female post fall in kitchen and R hip fracture, presents post direct anterior approach for hip hemiarthroplasty   Clinical Impression   PTA, pt was independent with ADLs and mobility. Pt currently requires max assist for bed mobility, mod assist for ADLs and min assist for transfers and ambulation. Pt plans to d/c home with unknown assistance from her son, daughter and granddaughters. Currently, pt is not safe to d/c home and would benefit from short-term SNF stay, but pt is refusing this option at this time. Will continue to follow acutely and work towards pt's goal of returning home at d/c.    Follow Up Recommendations  SNF;Supervision/Assistance - 24 hour (HHOT if pt progresses well - pt prefers to go home)    Equipment Recommendations  None recommended by OT    Recommendations for Other Services       Precautions / Restrictions Precautions Precautions: Fall Precaution Booklet Issued: No Precaution Comments: Direct anterior approach, no hip precautions Restrictions Weight Bearing Restrictions: Yes RLE Weight Bearing: Weight bearing as tolerated      Mobility Bed Mobility Overal bed mobility: Needs Assistance Bed Mobility: Supine to Sit;Sit to Supine     Supine to sit: Max assist Sit to supine: Mod assist   General bed mobility comments: Assist to move RLE on/off bed, to pivot hips to EOB and for trunk control to come to sitting position. Mod VCs for encouragement and safe hand placement. HOB elevated, heavy use of bedrails  Transfers Overall transfer level: Needs assistance Equipment used: Rolling walker (2 wheeled) Transfers: Sit to/from Stand Sit to Stand: Min assist Stand pivot transfers: Mod assist       General transfer comment: Min assist for boost to stand from low surfaces, min guard assist  from higher surfaces.VCs for safe hand placement.    Balance Overall balance assessment: Needs assistance Sitting-balance support: No upper extremity supported;Feet supported Sitting balance-Leahy Scale: Fair Sitting balance - Comments: limited due to pain   Standing balance support: Bilateral upper extremity supported;During functional activity Standing balance-Leahy Scale: Poor Standing balance comment: reliant on UE support to maintain balance                            ADL Overall ADL's : Needs assistance/impaired     Grooming: Wash/dry hands;Min guard;Standing   Upper Body Bathing: Supervision/ safety;Sitting   Lower Body Bathing: Moderate assistance;Sit to/from stand   Upper Body Dressing : Supervision/safety;Sitting   Lower Body Dressing: Moderate assistance;Sit to/from stand   Toilet Transfer: Minimal assistance;Cueing for safety;Ambulation;Comfort height toilet;Grab bars;RW   Toileting- Clothing Manipulation and Hygiene: Minimal assistance;Sit to/from stand Toileting - Clothing Manipulation Details (indicate cue type and reason): assist for sit-stand     Functional mobility during ADLs: Minimal assistance;Rolling walker       Vision Vision Assessment?: No apparent visual deficits   Perception     Praxis      Pertinent Vitals/Pain Pain Assessment: Faces Pain Score: 5  Faces Pain Scale: Hurts whole lot Pain Location: R hip Pain Descriptors / Indicators: Burning;Sore Pain Intervention(s): Limited activity within patient's tolerance;Monitored during session;Repositioned;Premedicated before session     Hand Dominance Right   Extremity/Trunk Assessment Upper Extremity Assessment Upper Extremity Assessment: Generalized weakness   Lower Extremity Assessment Lower Extremity Assessment: RLE deficits/detail RLE Deficits / Details: decreased  ROM and strength as expected post op RLE: Unable to fully assess due to pain   Cervical / Trunk  Assessment Cervical / Trunk Assessment: Kyphotic   Communication Communication Communication: No difficulties   Cognition Arousal/Alertness: Awake/alert Behavior During Therapy: WFL for tasks assessed/performed Overall Cognitive Status: Within Functional Limits for tasks assessed                     General Comments       Exercises Exercises: Total Joint     Shoulder Instructions      Home Living Family/patient expects to be discharged to:: Private residence Living Arrangements: Alone Available Help at Discharge: Family;Available 24 hours/day (will need to confirm) Type of Home: House Home Access: Stairs to enter Entrance Stairs-Number of Steps: 2 Entrance Stairs-Rails: Right Home Layout: One level     Bathroom Shower/Tub: Walk-in Hydrologist: Standard     Home Equipment: Environmental consultant - 2 wheels;Cane - single point;Bedside commode;Shower seat;Grab bars - tub/shower;Hand held shower head   Additional Comments: Pt's daughter has stayed with her before when she broke other hip and pt reports she will do this again - will need to confirm this. Pt's daughter works during the day and would be home alone.      Prior Functioning/Environment Level of Independence: Independent with assistive device(s)        Comments: ocassionally uses SPC or RW    OT Diagnosis: Generalized weakness;Acute pain   OT Problem List: Decreased strength;Decreased range of motion;Impaired balance (sitting and/or standing);Decreased activity tolerance;Decreased safety awareness;Decreased knowledge of use of DME or AE;Pain   OT Treatment/Interventions: Self-care/ADL training;Therapeutic exercise;Energy conservation;DME and/or AE instruction;Therapeutic activities;Patient/family education;Balance training    OT Goals(Current goals can be found in the care plan section) Acute Rehab OT Goals Patient Stated Goal: To go home, get therapy at home OT Goal Formulation: With  patient Time For Goal Achievement: 01/30/16 Potential to Achieve Goals: Good ADL Goals Pt Will Perform Grooming: with supervision;standing Pt Will Perform Lower Body Bathing: with supervision;sit to/from stand Pt Will Perform Lower Body Dressing: with supervision;sit to/from stand Pt Will Transfer to Toilet: with supervision;ambulating;bedside commode (over toilet) Pt Will Perform Toileting - Clothing Manipulation and hygiene: with supervision;sitting/lateral leans;sit to/from stand  OT Frequency: Min 2X/week   Barriers to D/C: Decreased caregiver support  Unsure if pt will have consistent assistance during the day and will need 24/7 assistance due to increased level of phyiscal assist required       Co-evaluation              End of Session Equipment Utilized During Treatment: Gait belt;Rolling walker Nurse Communication: Mobility status  Activity Tolerance: Patient tolerated treatment well Patient left: in bed;with call bell/phone within reach   Time: 1421-1503 OT Time Calculation (min): 42 min Charges:  OT General Charges $OT Visit: 1 Procedure OT Evaluation $OT Eval Moderate Complexity: 1 Procedure OT Treatments $Self Care/Home Management : 23-37 mins G-Codes:    Redmond Baseman, OTR/L Pager: [phone] 01/16/2016, 3:25 PM

## 2016-01-16 NOTE — Progress Notes (Signed)
Pt awakened at 2200 and was disoriented to place. She thought she was at her daughters house. Narcotics held. Pt is having low levels of pain at this time.

## 2016-01-16 NOTE — Care Management Note (Signed)
Case Management Note  Patient Details  Name: Carolyn [name] MRN: [id number] Date of Birth: 04-11-1931  Subjective/Objective: 82 yo F post fall in kitchen, sustained a R hip fx, presents post direct anterior approach for hip hemiarthroplasty                Action/Plan: PT is recommending CIR or SNF   Expected Discharge Date:                  Expected Discharge Plan:  [place]  In-House Referral:  Clinical Social Work  Discharge planning Services  CM Consult  Post Acute Care Choice:    Choice offered to:     DME Arranged:    DME Agency:     HH Arranged:    [place] Agency:     Status of Service:  In process, will continue to follow  If discussed at Long Length of Stay Meetings, dates discussed:    Additional Comments: D/C plan is CIR or SNF. Rehab consult ordered. Will continue to f/u.    Norina Buzzard, RN 01/16/2016, 3:04 PM

## 2016-01-16 NOTE — Op Note (Signed)
Carolyn [name], Carolyn [name] NO.:  [id number]  MEDICAL RECORD NO.:  [id number]  LOCATION:  [id number]                        FACILITY:  [facility]  PHYSICIAN:  Lind Guest. Ninfa Linden, M.D.DATE OF BIRTH:  [birth year]  DATE OF PROCEDURE:  01/15/2016 DATE OF DISCHARGE:                              OPERATIVE REPORT   PREOPERATIVE DIAGNOSIS:  Right hip displaced femoral neck fracture.  POSTOPERATIVE DIAGNOSIS:  Right hip displaced femoral neck fracture.  PROCEDURE:  Right hip bipolar hemiarthroplasty.  IMPLANTS:  DePuy Corail femoral component size 15 with standard offset, size 50 unipolar head with a bipolar 28 +8.5 metal hip ball.  SURGEON:  Lind Guest. Ninfa Linden, M.D.  ASSISTANT:  Erskine Emery, PA-C.  ANESTHESIA:  General.  ANTIBIOTICS:  2 g IV Ancef.  BLOOD LOSS:  100 mL.  COMPLICATIONS:  None.  INDICATIONS:  [name] is an 82 year old female, who sustained a mechanical fall earlier today.  She had inability to ambulate and severe right hip pain.  She was seen at the [facility] Room and found to have a displaced right hip femoral neck fracture.  She was seen by Orthopedic Surgery as well as the Medicine Teaching Service.  She was admitted to Medicine and cleared for surgery.  The family fully understands the risks of acute blood loss anemia, nerve and vessel injury, further fracture, infection, dislocation.  They understand our goals are decreased pain, improved mobility, and overall improved quality of life.  PROCEDURE DESCRIPTION:  After informed consent was obtained, appropriate right hip was marked.  She was brought to the operating room.  General anesthesia was obtained while she was on a stretcher.  A Foley catheter was placed in both feet and had traction boots applied to them.  Next, she was placed supine on the Hana fracture table with a perineal post in place and both legs in inline skeletal traction devices but no traction applied.  Her  right operative hip was then prepped and draped with DuraPrep and sterile drapes.  Time-out was called and she was identified as the correct patient and correct right hip.  We then made an incision inferior and posterior to the anterior superior iliac spine and carried this obliquely down the leg.  We dissected down the tensor fascia lata muscle.  Tensor fascia was then divided longitudinally so as to proceed with a direct anterior approach to the hip.  We identified and cauterized the circumflex vessels and then identified the hip capsule. We opened up the hip capsule in an L-type format finding a large hematoma consistent with a hip fracture.  We were able to identify a femoral neck fracture.  We made our femoral neck cut with an oscillating saw just distal to the femoral neck fracture but proximal to the lesser trochanter.  We made this with the oscillating saw and completed with an osteotome.  We placed a corkscrew guide in the femoral head and removed the femoral head in its entirety.  I took a measurement off this and trialed but what we felt was it would be the best to appropriate size 50 head.  Attention was then turned to the femur.  With the leg externally rotated to  120 degrees, extended and adducted, we used a Mueller retractor medially and a Hohmann retractor behind the greater trochanter.  We used a box cutting osteotome to enter the femoral canal and a rongeur to lateralize after releasing the lateral joint capsule. We then began broaching from a size 8 broach up to a size 15.  With the 15 in place, we trialed a standard offset femoral neck and then a 51 hip ball with a 28 +1.  We reduced this in acetabulum and we felt it was stable so we dislocated the hip.  We felt like we needed to go a little bit more leg length.  We placed the real size 15 stem from Corail with a standard offset and the real 51 larger hip ball with a small bipolar 28 +5, reduced this in the acetabulum  and we were not pleased with stability.  I felt like after that we needed to go with a smaller hip ball in terms of the outer shell.  We dislocated the hip, and then we replaced another size 50 hip ball with a 28 +8.5 smaller ball within this and reduced this in the acetabulum.  We were pleased with leg length, offset, and stability.  We then irrigated the soft tissues with normal saline solution using pulsatile lavage, closed the joint capsule with interrupted #1 Ethibond suture, followed by running #1 Vicryl in the tensor fascia, 0 Vicryl in deep tissue, 2-0 Vicryl in subcutaneous tissue, and interrupted staples on the skin.  Xeroform and Aquacel dressing were applied.  She was taken off the Hana table, awakened, extubated and taken to the recovery room in stable condition.  All final counts were correct.  There were no complications noted.  Of note, Erskine Emery, PA-C assisted in the entire case.  His assistance was crucial for facilitating all aspects of this case.     Lind Guest. Ninfa Linden, M.D.     CYB/MEDQ  D:  01/15/2016  T:  01/16/2016  Job:  [id number]

## 2016-01-17 DIAGNOSIS — R410 Disorientation, unspecified: Secondary | ICD-10-CM | POA: Diagnosis not present

## 2016-01-17 LAB — BASIC METABOLIC PANEL
ANION GAP: 8 (ref 5–15)
BUN: 11 mg/dL (ref 6–20)
CO2: 27 mmol/L (ref 22–32)
Calcium: 8.9 mg/dL (ref 8.9–10.3)
Chloride: 97 mmol/L — ABNORMAL LOW (ref 101–111)
Creatinine, Ser: 0.95 mg/dL (ref 0.44–1.00)
GFR, EST NON AFRICAN AMERICAN: 54 mL/min — AB (ref >60)
GLUCOSE: 105 mg/dL — AB (ref 65–99)
POTASSIUM: 4.3 mmol/L (ref 3.5–5.1)
SODIUM: 132 mmol/L — AB (ref 135–145)

## 2016-01-17 LAB — URINALYSIS, ROUTINE W REFLEX MICROSCOPIC
BILIRUBIN URINE: NEGATIVE
GLUCOSE, UA: NEGATIVE mg/dL
HGB URINE DIPSTICK: NEGATIVE
Ketones, ur: 15 mg/dL — AB
Nitrite: NEGATIVE
Protein, ur: NEGATIVE mg/dL
SPECIFIC GRAVITY, URINE: 1.021 (ref 1.005–1.030)
pH: 7.5 (ref 5.0–8.0)

## 2016-01-17 LAB — CBC
HCT: 40.6 % (ref 36.0–46.0)
Hemoglobin: 13.1 g/dL (ref 12.0–15.0)
MCH: 31.3 pg (ref 26.0–34.0)
MCHC: 32.3 g/dL (ref 30.0–36.0)
MCV: 96.9 fL (ref 78.0–100.0)
PLATELETS: 125 10*3/uL — AB (ref 150–400)
RBC: 4.19 MIL/uL (ref 3.87–5.11)
RDW: 13.8 % (ref 11.5–15.5)
WBC: 9.2 10*3/uL (ref 4.0–10.5)

## 2016-01-17 LAB — GLUCOSE, CAPILLARY: GLUCOSE-CAPILLARY: 88 mg/dL (ref 65–99)

## 2016-01-17 LAB — URINE MICROSCOPIC-ADD ON: RBC / HPF: NONE SEEN RBC/hpf (ref 0–5)

## 2016-01-17 MED ORDER — ENOXAPARIN SODIUM 30 MG/0.3ML ~~LOC~~ SOLN
30.0000 mg | Freq: Two times a day (BID) | SUBCUTANEOUS | Status: DC
  Administered 2016-01-17 – 2016-01-18 (×3): 30 mg via SUBCUTANEOUS
  Filled 2016-01-17 (×3): qty 0.3

## 2016-01-17 MED ORDER — ENOXAPARIN SODIUM 40 MG/0.4ML ~~LOC~~ SOLN: 40.0000 mg | SUBCUTANEOUS | Status: DC

## 2016-01-17 NOTE — Significant Event (Addendum)
Rapid Response Event Note  Overview:  Called by Rn for sats 78% Time Called: 1233 Arrival Time: 1240 Event Type: Respiratory  Initial Focused Assessment:  Called by Rn for sats noted to be 78% on nasal cannula.  Upon my arrival to patients room, Rn and NT at bedside.  Patient is lying in bed, screaming and cussing at staff and refusing care.  Patient was placed on NRB prior to my arrival. Bedside Pulse OX placed on forehead sats 100%, HR 100.  Keeps removing oxygen and pulse ox probe Skin warm and dry, no cyanosis noted   Interventions: Changed NRB back to nasal cannula 3lpm, Sats 100% and Hr 100.  Patient removed Gumlog, sats remained 98-100% on room air. Refusing to wear oxygen and pulse ox. Recommend to Rn to keep Pena off since sats are good right now and to monitor periodically.  Plan of Care (if not transferred):  RN to monitor pulse ox and vital signs.  Recommend calling MD and updating on status  Event Summary:  RN to call if assistance needed   at      at          [facility], [place]

## 2016-01-17 NOTE — Progress Notes (Signed)
Medicine attending: Medical history, presenting problems, physical findings, and medications, reviewed with resident physician Dr Tasrif Ahmed on the day of the patient visit and I concur with his evaluation and management plan. 

## 2016-01-17 NOTE — Progress Notes (Signed)
Pt slept soundly for over four hours. When awakened she was alert and oriented x 4. She apologized for her behavior towards the staff  this morning. She spoke with her daughter on the phone and was coherent.

## 2016-01-17 NOTE — Progress Notes (Signed)
Subjective: 2 Days Post-Op Procedure(s) (LRB): ANTERIOR APPROACH HEMI HIP ARTHROPLASTY (Right) Patient reports pain as mild.  Family reports right shoulder pain yesterday with overhead motion. Radiographs obtained yesterday.   Objective: Vital signs in last 24 hours: Temp:  [98 F (36.7 C)-100.8 F (38.2 C)] 100.1 F (37.8 C) (06/25 0532) Pulse Rate:  [76-82] 76 (06/25 0532) Resp:  [14-16] 14 (06/25 0532) BP: (105-134)/(62-89) 125/89 mmHg (06/25 0532) SpO2:  [94 %-100 %] 100 % (06/25 0532)  Intake/Output from previous day: 06/24 0701 - 06/25 0700 In: 1870 [P.O.:345; I.V.:1325; IV Piggyback:200] Out: 375 [Urine:375] Intake/Output this shift:     Recent Labs  01/15/16 1450 01/15/16 1819 01/16/16 0311  HGB 13.8 13.6 12.7    Recent Labs  01/15/16 1819 01/16/16 0311  WBC 10.1 10.9*  RBC 4.29 4.07  HCT 41.0 38.9  PLT 209 176    Recent Labs  01/15/16 1450 01/16/16 0311  NA 131* 132*  K 4.8 4.0  CL 98* 98*  CO2 26 26  BUN 13 10  CREATININE 1.06* 0.89  GLUCOSE 101* 119*  CALCIUM 10.0 8.9    Recent Labs  01/16/16 0311  INR 1.06   Right lower extremity: Sensation intact distally Intact pulses distally Dorsiflexion/Plantar flexion intact Incision: dressing C/D/I Compartment soft  Right Upper extremity: Gentle ROM of right shoulder causes no pain. No attempts of overhead motion performed. Shoulder with fluid external and internal motion. Right hand NVI.   Radiographs: right shoulder no acute fracture humeral head located. degenerative changes of AC joint.   Assessment/Plan: 2 Days Post-Op Procedure(s) (LRB): ANTERIOR APPROACH HEMI HIP ARTHROPLASTY (Right) Up with therapy  Right shoulder pain PT to evaluate and treat right shoulder . Gentle ROM.  Zayna Toste 01/17/2016, 9:01 AM

## 2016-01-17 NOTE — Progress Notes (Addendum)
Pt is confused and agitated, Pt is resting in her bed with eye closed, be able to response with verbal cues.  She refused all cares and refuse her daily medications. Attempted asess her with assist from her family.She stated:"get out of my room, woman". BP 138/98, HR 80, RR16, O2sat 100 on [place] 2L . Notified Dr. Posey Pronto from teaching service, Dr. Posey Pronto is aware with pt's AMS. Pt's medication was adjusted by MD. And will continue to monitor pt closely.

## 2016-01-17 NOTE — Progress Notes (Signed)
Pt refused blood draw. Will attempt again at 0800

## 2016-01-17 NOTE — Progress Notes (Addendum)
Subjective: Her son and daughter were at bedside and were concerned about her worsening mental status. Around 11am yesterday, she began having visual hallucinations, and she became progressively disoriented and confused to the point where she was refusing therapy. This morning, she was refusing bloodwork and had a particularly irritable temperament.   I reassured the family members that some amount of delirium is expected following a major surgery like a hip procedure but that some of the medications she received may have been contributory, particularly for insomnia. She also had a fever overnight to 100.56F around 8pm.  Objective: Vital signs in last 24 hours: Filed Vitals:   01/16/16 2008 01/16/16 2300 01/17/16 0532 01/17/16 0951  BP: 105/62  125/89 138/98  Pulse: 81  76 80  Temp: 100.8 F (38.2 C)  100.1 F (37.8 C)   TempSrc: Oral  Axillary   Resp: 16  14 16   SpO2: 95% 94% 100% 100%   Weight change:   Intake/Output Summary (Last 24 hours) at 01/17/16 1122 Last data filed at 01/16/16 2300  Gross per 24 hour  Intake  657.5 ml  Output      0 ml  Net  657.5 ml   General: Resting in bed with eyes closed. No acute distress. Cardiovascular: Regular rate, rhythm, no murmur appreciated  Abdominal: Soft, non-tender, non-distended, BS + Extremities: No lower extremity edema bilaterally, pulses symmetric and intact bilaterally. Skin: R hip with bandage overlying incision site that is clean, dry, intact underneath.  Lab Results: reviewed Micro Results: Recent Results (from the past 240 hour(s))  Surgical pcr screen     Status: None   Collection Time: 01/15/16  9:08 PM  Result Value Ref Range Status   MRSA, PCR NEGATIVE NEGATIVE Final   Staphylococcus aureus NEGATIVE NEGATIVE Final    Comment:        The Xpert SA Assay (FDA approved for NASAL specimens in patients over 8 years of age), is one component of a comprehensive surveillance program.  Test performance has been  validated by [facility] for patients greater than or equal to 16 year old. It is not intended to diagnose infection nor to guide or monitor treatment.    Studies/Results: Dg Chest 1 View  01/15/2016  CLINICAL DATA:  Patient fell today.  Hip pain. EXAM: CHEST 1 VIEW COMPARISON:  07/14/2015 FINDINGS: Numerous naps overlie the chest. Lungs are hyperexpanded. Atelectasis or scarring noted left base. Interstitial markings are diffusely coarsened with chronic features. The cardiopericardial silhouette is within normal limits for size. Small hiatal hernia noted. Bones are diffusely demineralized. IMPRESSION: Emphysema with atelectasis or scarring at the left base. Small hiatal hernia. Electronically Signed   By: Misty Stanley M.D.   On: 01/15/2016 16:17   Dg Shoulder Right  01/16/2016  CLINICAL DATA:  82 year old female with a history of fall EXAM: RIGHT SHOULDER - 2+ VIEW COMPARISON:  None. FINDINGS: No acute fracture.  Glenohumeral joint appears congruent. Degenerative changes of the acromioclavicular joint and glenohumeral joint. Osteopenia. IMPRESSION: No acute bony abnormality. Signed, Dulcy Fanny. Earleen Newport, DO [facility] Electronically Signed   By: Corrie Mckusick D.O.   On: 01/16/2016 18:26   Pelvis Portable  01/15/2016  CLINICAL DATA:  Status post right hip surgery.  Initial encounter. EXAM: PORTABLE PELVIS 1-2 VIEWS COMPARISON:  Pelvis radiograph performed 02/07/2014 FINDINGS: The patient is status post right hip arthroplasty. The arthroplasty is noted in expected position, without evidence of loosening or new fracture. Overlying skin  staples and scattered soft tissue air are seen. The patient's left hip arthroplasty is unremarkable in appearance. The visualized bowel gas pattern is grossly unremarkable in appearance. IMPRESSION: Status post right hip arthroplasty.  No evidence of new fracture. Electronically Signed   By: Garald Balding M.D.   On:  01/15/2016 22:52   Dg Hip Operative Unilat W Or W/o Pelvis Right  01/16/2016  CLINICAL DATA:  Right hip hemiarthroplasty.  Initial encounter. EXAM: OPERATIVE RIGHT HIP (WITH PELVIS IF PERFORMED) 1 VIEW TECHNIQUE: Fluoroscopic spot image(s) were submitted for interpretation post-operatively. COMPARISON:  Right hip radiographs performed earlier today at 3:29 p.m. FINDINGS: Five fluoroscopic C-arm images are provided from the OR, demonstrating placement of a right hip hemiarthroplasty. Overlying postoperative soft tissue air is noted. No new fractures are seen. The visualized portions of the patient's left hip hemiarthroplasty is grossly unremarkable. IMPRESSION: Status post placement of right hip hemiarthroplasty. No new fracture seen. Electronically Signed   By: Garald Balding M.D.   On: 01/16/2016 00:56   Dg Hip Unilat With Pelvis 2-3 Views Right  01/15/2016  CLINICAL DATA:  RIGHT hip pain after falling today, initial encounter EXAM: DG HIP (WITH OR WITHOUT PELVIS) 2-3V RIGHT COMPARISON:  None FINDINGS: Marked osseous demineralization. LEFT hip prosthesis. SI joints preserved. Displaced subcapital RIGHT femoral neck fracture. No dislocation. Pelvis appears intact. Scattered atherosclerotic calcifications aorta and iliac arteries. IMPRESSION: Osseous demineralization with displaced subcapital fracture RIGHT femoral neck. Prior LEFT hip replacement. Aortic atherosclerosis. Electronically Signed   By: Lavonia Dana M.D.   On: 01/15/2016 16:19   Medications: I have reviewed the patient's current medications. Scheduled Meds: . cholecalciferol  800 Units Oral Daily  . docusate sodium  100 mg Oral BID  . gabapentin  600 mg Oral QHS  . levothyroxine  88 mcg Oral QAC breakfast  . metoprolol succinate  50 mg Oral Daily  . pantoprazole  80 mg Oral Q1200  . ramelteon  8 mg Oral QHS  . senna-docusate  2 tablet Oral QHS  . simvastatin  10 mg Oral q1800  . sodium chloride flush  3 mL Intravenous Q12H    Continuous Infusions:  PRN Meds:.acetaminophen **OR** acetaminophen, HYDROcodone-acetaminophen, menthol-cetylpyridinium **OR** phenol, morphine injection, ondansetron **OR** ondansetron (ZOFRAN) IV Assessment/Plan: Principal Problem:   Fracture of femoral neck, right (HCC) Active Problems:   Hypothyroidism   Essential hypertension   Barrett's esophagus   PAC (premature atrial contraction)   Recurrent UTI vs overactive bladder  Post-operative delirium: Suspect polypharmacy is contributory as she received mirtazipine early morning of POD 1 and then alprazalom last night along with ramelteon. T100.50F overnight which does raise concern for UTI, and she does have prior history. No metabolic causes noted on labwork this morning. -Allow for supportive care to wait for medications to wash out -Hold ramelteon, alprazolam -Discontinue methocarbamol, metoclopramide  -Switch to Lovenox injection for DVT prophylaxis as she is not eating food to allow for absorption with Xarelto -Continue morphine 0.5mg  every 2 hours as needed for breakthrough pain -Check UA  Right femoral neck fracture: S/p hemiarthroplasty 6/23, now POD2.  -Orthopedics following and appreciates recs -PT/OT following, appreciate recs -Continue regular diet  -Continue incentive spirometer -Follow-up vitamin D -Start 800 IU vitamin D when she is tolerating oral intake -Recommend bisphosphonate therapy on discharge  Thrombocytopenia: Platelets 125 this morning, down from 176 1 day and 200s 2 days ago. Suspect it's related to anticoagulation. No drop in Hb post-surgically to account for this difference. -Follow CBC  History of  DVT: Patient was on Eliquis at some point int he past, and given her history of DVT, we recommend full dose anticoagulation for 35 days postop, and so we have discontinued aspirin and will start anticoagulation. This calculated using CAPRINI score. However, she has a history of GAVE as shown in the EGD. So  she has moderate risk of bleeding but her risk of DVT outweighs the potential risk of bleeding -Lovenox as noted above [Day 2/35, 6/24-7/29]  Hypertension: BP trending 105-134/62-76. -Continue metoprolol 50 mg daily  Dispo: Disposition is deferred at this time, awaiting improvement of current medical problems.    The patient does have a current PCP (Aldine Contes, MD) and does need an [facility] hospital follow-up appointment after discharge.  The patient does not have transportation limitations that hinder transportation to clinic appointments.  .Services Needed at time of discharge: Y = Yes, Blank = No PT:   OT:   RN:   Equipment:   Other:     LOS: 2 days   Riccardo Dubin, MD 01/17/2016, 11:22 AM

## 2016-01-17 NOTE — Progress Notes (Signed)
PT Cancellation Note  Patient Details Name: Carolyn [name] MRN: [id number] DOB: Mar 08, 1931   Cancelled Treatment:    Reason Eval/Treat Not Completed: Medical issues which prohibited therapy.  Noted pt is very agitated with staff, refusing care, and is not in her normal state.  Will hold PT for today and check back tomorrow.   Melvern Banker 01/17/2016, 1:16 PM  Lavonia Dana, PT  [phone] 01/17/2016

## 2016-01-17 NOTE — Progress Notes (Signed)
ANTICOAGULATION CONSULT NOTE - Initial Consult  Pharmacy Consult for Lovenox  Indication: VTE prophylaxis following THA on 6/23  Allergies  Allergen Reactions  . Penicillins Hives    Patient Measurements:   Vital Signs: Temp: 100.1 F (37.8 C) (06/25 0532) Temp Source: Axillary (06/25 0532) BP: 138/98 mmHg (06/25 0951) Pulse Rate: 80 (06/25 0951)  Labs:  Recent Labs  01/15/16 1450 01/15/16 1819 01/16/16 0311 01/17/16 0833  HGB 13.8 13.6 12.7 13.1  HCT 41.6 41.0 38.9 40.6  PLT 193 209 176 125*  APTT  --   --  33  --   LABPROT  --   --  14.0  --   INR  --   --  1.06  --   CREATININE 1.06*  --  0.89 0.95    Estimated Creatinine Clearance: 46.1 mL/min (by C-G formula based on Cr of 0.95).   Medical History: Past Medical History  Diagnosis Date  . Hypertension   . High cholesterol   . Hypothyroidism   . Asthma     "only when I smoked"  . Hiatal hernia     s/p gastropexy after failed Nissen fundoplication.   . Barrett's esophagus since at least 1985  . Arthritis 11/2013.     "both knees" (11/26/2013)  . Depression with anxiety 11/2013    "little bit"   . COPD (chronic obstructive pulmonary disease) ([place])   . Pneumonia 11/2013  . Diverticulosis of colon 1996  . CKD (chronic kidney disease)     GFR 55 11/2014   . GAVE (gastric antral vascular ectasia) 01/16/2015  . Hx of adenomatous colonic polyps     Assessment: 79 yoF with PMHx of DVT that was treated with apixaban that is s/p THA on 6/23. Initially, she was started on Xarelto 10 mg daily but has developed post operative delirium and is refusing PO medications. Pharmacy consulted to assist with Lovenox dosing for VTE prophylaxis in setting of recent THA.    Goal of Therapy:  Monitor platelets by anticoagulation protocol: Yes   Plan:  1. Begin Lovenox 30 mg BID for now 2. Will monitor for any sxs of bleeding, clinical course and make changes to regimen as needed  ,Vincenza Hews, PharmD, BCPS 01/17/2016,  11:41 AM Pager: [phone]

## 2016-01-17 NOTE — Progress Notes (Signed)
Occupational Therapy Treatment Patient Details Name: TALEEA DINATALE MRN: [id number] DOB: [birth year] Today's Date: 01/17/2016    History of present illness 82 yo female post fall in kitchen and R hip fracture, presents post direct anterior approach for hip hemiarthroplasty   OT comments  Session limited this day due to pt. having increased agitation and refusal to participate in any functional movement.  Dtr. And son present and state this is not her usual demeanor.  rn notified of family concerns. Will continue to follow acutely as pt. Able.     Follow Up Recommendations  SNF;Supervision/Assistance - 24 hour    Equipment Recommendations  None recommended by OT    Recommendations for Other Services      Precautions / Restrictions Precautions Precautions: Fall Precaution Comments: Direct anterior approach, no hip precautions Restrictions RLE Weight Bearing: Weight bearing as tolerated       Mobility Bed Mobility Overal bed mobility: +2 for physical assistance Bed Mobility: Supine to Sit     Supine to sit: +2 for physical assistance     General bed mobility comments: with required max encouragement and cues for participation.  adamant refusal.  yelling, cursing, and refusing to open eyes. covering face with her hand.  with dtr. assist able to bring b les off of bed and i supported trunk total assist as pt. actively pushed to lie back down.  max hand over hand assist to have LUE on bed rail and pt. would not comply.  yelling the entire session .  assisted  back to bed, total assist.   Transfers                      Balance                                   ADL                                                Vision                     Perception     Praxis      Cognition   Behavior During Therapy: Agitated Overall Cognitive Status: Impaired/Different from baseline (pt. cursing and yelling, family states not her  demeanor at all)                       Extremity/Trunk Assessment               Exercises     Shoulder Instructions       General Comments      Pertinent Vitals/ Pain       Pain Assessment:  (did not rate but talked about "just having had sx")  Home Living                                          Prior Functioning/Environment              Frequency Min 2X/week     Progress Toward Goals  OT Goals(current goals can now be found in the care plan section)  Progress towards OT goals:  OT to reassess next treatment     Plan Discharge plan remains appropriate    Co-evaluation                 End of Session     Activity Tolerance Treatment limited secondary to agitation   Patient Left in bed;with call bell/phone within reach;with bed alarm set;with family/visitor present   Nurse Communication Other (comment) (spoke with rn regarding pts. agitation and family concerns)        Time: [id number] OT Time Calculation (min): 35 min  Charges: OT General Charges $OT Visit: 1 Procedure OT Treatments $Self Care/Home Management : 23-37 mins  Janice Coffin, COTA/L 01/17/2016, 9:58 AM

## 2016-01-17 NOTE — Progress Notes (Signed)
Rehab Admissions Coordinator Note:  Patient was screened by Cleatrice Burke for appropriateness for an Inpatient Acute Rehab Consult.  At this time, we are recommending [place]. [facility] Medicare unlikely to approve an inpt rehab admission for this diagnosis.  Cleatrice Burke 01/17/2016, 2:04 PM  I can be reached at [phone].

## 2016-01-17 NOTE — Progress Notes (Signed)
RN hear the continuous pulse OX alarm went off and came into her room to assess her. Upon into pt room. Pt is in sleep with  at 1L. pulse OX dropped to 80s. Wake up pt and encouraged her to deep breath. Oxygen increased to 4L and sats did not improve. Placed pt on NRB as further intervention.  Pt became agitated, screaming and fighting with staff. Called RRT RN and notified Dr. Posey Pronto.  Dr. Posey Pronto acknowledged the message and stated he will check with pt.

## 2016-01-18 ENCOUNTER — Encounter (HOSPITAL_COMMUNITY): Payer: Self-pay | Admitting: Orthopaedic Surgery

## 2016-01-18 DIAGNOSIS — S72001D Fracture of unspecified part of neck of right femur, subsequent encounter for closed fracture with routine healing: Secondary | ICD-10-CM

## 2016-01-18 DIAGNOSIS — R300 Dysuria: Secondary | ICD-10-CM | POA: Diagnosis not present

## 2016-01-18 DIAGNOSIS — R2681 Unsteadiness on feet: Secondary | ICD-10-CM | POA: Diagnosis not present

## 2016-01-18 DIAGNOSIS — N3281 Overactive bladder: Secondary | ICD-10-CM | POA: Diagnosis not present

## 2016-01-18 DIAGNOSIS — W010XXD Fall on same level from slipping, tripping and stumbling without subsequent striking against object, subsequent encounter: Secondary | ICD-10-CM

## 2016-01-18 DIAGNOSIS — Z79899 Other long term (current) drug therapy: Secondary | ICD-10-CM | POA: Diagnosis not present

## 2016-01-18 DIAGNOSIS — G4700 Insomnia, unspecified: Secondary | ICD-10-CM | POA: Diagnosis not present

## 2016-01-18 DIAGNOSIS — M6281 Muscle weakness (generalized): Secondary | ICD-10-CM | POA: Diagnosis not present

## 2016-01-18 DIAGNOSIS — E039 Hypothyroidism, unspecified: Secondary | ICD-10-CM | POA: Diagnosis not present

## 2016-01-18 DIAGNOSIS — M810 Age-related osteoporosis without current pathological fracture: Secondary | ICD-10-CM | POA: Diagnosis not present

## 2016-01-18 DIAGNOSIS — S72001S Fracture of unspecified part of neck of right femur, sequela: Secondary | ICD-10-CM | POA: Diagnosis not present

## 2016-01-18 DIAGNOSIS — D649 Anemia, unspecified: Secondary | ICD-10-CM | POA: Diagnosis not present

## 2016-01-18 DIAGNOSIS — Z471 Aftercare following joint replacement surgery: Secondary | ICD-10-CM | POA: Diagnosis not present

## 2016-01-18 DIAGNOSIS — E785 Hyperlipidemia, unspecified: Secondary | ICD-10-CM | POA: Diagnosis not present

## 2016-01-18 DIAGNOSIS — S72009A Fracture of unspecified part of neck of unspecified femur, initial encounter for closed fracture: Secondary | ICD-10-CM | POA: Diagnosis not present

## 2016-01-18 DIAGNOSIS — Z96641 Presence of right artificial hip joint: Secondary | ICD-10-CM | POA: Diagnosis not present

## 2016-01-18 DIAGNOSIS — I10 Essential (primary) hypertension: Secondary | ICD-10-CM | POA: Diagnosis not present

## 2016-01-18 DIAGNOSIS — K2270 Barrett's esophagus without dysplasia: Secondary | ICD-10-CM | POA: Diagnosis not present

## 2016-01-18 LAB — CBC
HCT: 35.1 % — ABNORMAL LOW (ref 36.0–46.0)
Hemoglobin: 11 g/dL — ABNORMAL LOW (ref 12.0–15.0)
MCH: 30.4 pg (ref 26.0–34.0)
MCHC: 31.3 g/dL (ref 30.0–36.0)
MCV: 97 fL (ref 78.0–100.0)
PLATELETS: 148 10*3/uL — AB (ref 150–400)
RBC: 3.62 MIL/uL — AB (ref 3.87–5.11)
RDW: 13.9 % (ref 11.5–15.5)
WBC: 9.7 10*3/uL (ref 4.0–10.5)

## 2016-01-18 LAB — VITAMIN D 25 HYDROXY (VIT D DEFICIENCY, FRACTURES): VIT D 25 HYDROXY: 47.2 ng/mL (ref 30.0–100.0)

## 2016-01-18 LAB — GLUCOSE, CAPILLARY: GLUCOSE-CAPILLARY: 104 mg/dL — AB (ref 65–99)

## 2016-01-18 MED ORDER — ALENDRONATE SODIUM 70 MG PO TABS: 70.0000 mg | ORAL_TABLET | ORAL | Status: DC

## 2016-01-18 MED ORDER — HYDROCODONE-ACETAMINOPHEN 5-325 MG PO TABS: 1.0000 | ORAL_TABLET | Freq: Four times a day (QID) | ORAL | Status: DC | PRN

## 2016-01-18 MED ORDER — RIVAROXABAN 10 MG PO TABS: 10.0000 mg | ORAL_TABLET | Freq: Every day | ORAL | Status: DC

## 2016-01-18 MED ORDER — SENNOSIDES-DOCUSATE SODIUM 8.6-50 MG PO TABS: 2.0000 | ORAL_TABLET | Freq: Every day | ORAL | Status: DC

## 2016-01-18 MED ORDER — ONDANSETRON HCL 4 MG PO TABS: 4.0000 mg | ORAL_TABLET | Freq: Four times a day (QID) | ORAL | Status: DC | PRN

## 2016-01-18 MED ORDER — VITAMIN D3 10 MCG (400 UNIT) PO TABS: 800.0000 [IU] | ORAL_TABLET | Freq: Every day | ORAL | Status: DC

## 2016-01-18 NOTE — Care Management Important Message (Signed)
Important Message  Patient Details  Name: BRIANCA SHIH MRN: [id number] Date of Birth: 02-01-1931   Medicare Important Message Given:  Yes    Loann Quill 01/18/2016, 8:57 AM

## 2016-01-18 NOTE — Progress Notes (Signed)
Subjective: 3 Days Post-Op Procedure(s) (LRB): ANTERIOR APPROACH HEMI HIP ARTHROPLASTY (Right) Patient reports pain as moderate.  Had a slow day yesterday with limited mobility, some related to pain meds.  Vitals and H&H stable.  Objective: Vital signs in last 24 hours: Temp:  [97.5 F (36.4 C)-98.6 F (37 C)] 97.7 F (36.5 C) (06/26 0636) Pulse Rate:  [80-89] 84 (06/26 0636) Resp:  [15-16] 16 (06/26 0636) BP: (91-138)/(55-98) 109/60 mmHg (06/26 0636) SpO2:  [93 %-100 %] 99 % (06/26 0636)  Intake/Output from previous day: 06/25 0701 - 06/26 0700 In: 680 [P.O.:680] Out: -  Intake/Output this shift:     Recent Labs  01/15/16 1450 01/15/16 1819 01/16/16 0311 01/17/16 0833 01/18/16 0543  HGB 13.8 13.6 12.7 13.1 11.0*    Recent Labs  01/17/16 0833 01/18/16 0543  WBC 9.2 9.7  RBC 4.19 3.62*  HCT 40.6 35.1*  PLT 125* 148*    Recent Labs  01/16/16 0311 01/17/16 0833  NA 132* 132*  K 4.0 4.3  CL 98* 97*  CO2 26 27  BUN 10 11  CREATININE 0.89 0.95  GLUCOSE 119* 105*  CALCIUM 8.9 8.9    Recent Labs  01/16/16 0311  INR 1.06    Sensation intact distally Intact pulses distally Dorsiflexion/Plantar flexion intact Incision: dressing C/D/I No cellulitis present Compartment soft  Assessment/Plan: 3 Days Post-Op Procedure(s) (LRB): ANTERIOR APPROACH HEMI HIP ARTHROPLASTY (Right) Up with therapy Discharge to SNF when medically stable. Agree with Xarelto due to past history of DVT.  Marena Witts Y 01/18/2016, 7:16 AM

## 2016-01-18 NOTE — Clinical Social Work Note (Signed)
Clinical Social Work Assessment  Patient Details  Name: Carolyn [name] MRN: [id number] Date of Birth: April 02, 1931  Date of referral:  01/18/16               Reason for consult:  Facility Placement, Discharge Planning                Permission sought to share information with:  Facility Art therapist granted to share information::  Yes, Verbal Permission Granted  Name::     Francis Dowse  Agency::  [facility]  Relationship::  Daughter  Contact Information:  [phone]  Housing/Transportation Living arrangements for the past 2 months:  [place] of Information:  Adult Children Patient Interpreter Needed:  None Criminal Activity/Legal Involvement Pertinent to Current Situation/Hospitalization:  No - Comment as needed Significant Relationships:  Adult Children Lives with:  Self Do you feel safe going back to the place where you live?  No Need for family participation in patient care:  Yes (Comment) (Patient's daughter active in patient's care.)  Care giving concerns:  No concerns addressed at this time.   Social Worker assessment / plan:  LCSW received referral for possible SNF placement at time of discharge. LCSW spoke with patient's daughter, who is active in patient's care. Per patient's daughter, family anticipates patient to be discharged to [facility] once medically stable. LCSW to continue to follow and assist with discharge planning needs.  Employment status:  Retired Science writer) PT Recommendations:  [place] / Referral to community resources:  [place]  Patient/Family's Response to care:  Patient's family understanding and agreeable to [facility] plan of care.  Patient/Family's Understanding of and Emotional Response to Diagnosis, Current Treatment, and Prognosis:  Patient's family understanding and agreeable to LCSW plan of care.  Emotional  Assessment Appearance:  Appears stated age Attitude/Demeanor/Rapport:  Other (LCSW spoke with patient's daughter) Affect (typically observed):  Other (LCSW spoke with patient's daughter) Orientation:  Oriented to Self, Oriented to Place, Oriented to  Time, Oriented to Situation Alcohol / Substance use:  Not Applicable Psych involvement (Current and /or in the community):  No (Comment) (Not appropriate on this admission.)  Discharge Needs  Concerns to be addressed:  No discharge needs identified Readmission within the last 30 days:  No Current discharge risk:  None Barriers to Discharge:  No Barriers Identified   Caroline Sauger, LCSW 01/18/2016, 11:37 AM [phone]

## 2016-01-18 NOTE — Progress Notes (Signed)
Pt being discharged to [facility] today per MD. Report called to [facility], all questions answered. Belongings packed and sent with pt's daughter. Waiting for transportation to facility via [place]. Will continue to monitor until that time.   [place], Jerry Caras

## 2016-01-18 NOTE — Progress Notes (Signed)
Physical Therapy Treatment Patient Details Name: Carolyn [name] MRN: [id number] DOB: 1931-01-17 Today's Date: 01/18/2016    History of Present Illness 82 yo female post fall in kitchen and R hip fracture, presents post direct anterior approach for hip hemiarthroplasty    PT Comments    Pt performed increased mobility and remains to require mod to min assistance for mobility at this time.  Pt will d/c ti rehab for continued therapy to improve strength and functional mobility before returning home.    Follow Up Recommendations  CIR;SNF;Supervision/Assistance - 24 hour     Equipment Recommendations  None recommended by PT    Recommendations for Other Services Rehab consult     Precautions / Restrictions Precautions Precautions: Fall Precaution Booklet Issued: No Precaution Comments: Direct anterior approach, no hip precautions   (discard anterior hip precautions charted in intial eval this was charted in error, upon further review pt has direct anterior approach with no hip precautions.  ) Restrictions Weight Bearing Restrictions: Yes RLE Weight Bearing: Weight bearing as tolerated    Mobility  Bed Mobility Overal bed mobility: Needs Assistance Bed Mobility: Supine to Sit     Supine to sit: Mod assist     General bed mobility comments: Pt performed and followed commands well.  Pt required cues for hand placement and assistance for LE advancement, trunk elevation and scooting to edge of surface.  Pt demonstrated kyphotic posturing in seated position.    Transfers Overall transfer level: Needs assistance Equipment used: Rolling walker (2 wheeled) Transfers: Sit to/from Stand Sit to Stand: Min assist Stand pivot transfers: Min assist       General transfer comment: Cues for hand placement to push from seated surface.  Pt's bed height elevated to perform transfer.  Upon standing PTA performed pericanal care and application of brief.    Ambulation/Gait Ambulation/Gait  assistance: Min assist Ambulation Distance (Feet): 18 Feet Assistive device: Rolling walker (2 wheeled) Gait Pattern/deviations: Step-to pattern;Antalgic;Trunk flexed     General Gait Details: Pt remains to complain of R quad pain, limited due to fatigue and required close chair follow.  Cues provided for sequencing and RW placement.     Stairs            Wheelchair Mobility    Modified Rankin (Stroke Patients Only)       Balance Overall balance assessment: Needs assistance   Sitting balance-Leahy Scale: Fair       Standing balance-Leahy Scale: Poor                      Cognition Arousal/Alertness: Awake/alert Behavior During Therapy: WFL for tasks assessed/performed Overall Cognitive Status: Within Functional Limits for tasks assessed                      Exercises      General Comments        Pertinent Vitals/Pain Pain Assessment: 0-10 Faces Pain Scale: Hurts little more Pain Location: R hip Pain Descriptors / Indicators: Discomfort;Grimacing;Guarding;Operative site guarding Pain Intervention(s): Monitored during session;Repositioned    Home Living                      Prior Function            PT Goals (current goals can now be found in the care plan section) Acute Rehab PT Goals Patient Stated Goal: to get ready for rehab Potential to Achieve Goals: Good Progress towards PT goals:  Progressing toward goals    Frequency  7X/week    PT Plan Current plan remains appropriate    Co-evaluation             End of Session Equipment Utilized During Treatment: Gait belt Activity Tolerance: Patient tolerated treatment well Patient left: in chair;with call bell/phone within reach     Time: 1340-1405 PT Time Calculation (min) (ACUTE ONLY): 25 min  Charges:  $Gait Training: 8-22 mins $Therapeutic Activity: 8-22 mins                    G Codes:      Cristela Blue 02/09/16, 2:18 PM  Governor Rooks, PTA pager  [phone]

## 2016-01-18 NOTE — Clinical Social Work Note (Signed)
Patient to be discharged to [facility]. Patient's daughter, Katharine Look, updated. Patient to be transported via EMS. RN report number: [place] [place] [facility]: [phone] Surgical: [phone]

## 2016-01-18 NOTE — Discharge Summary (Signed)
Name: Carolyn [name] MRN: [id number] DOB: [birth year] 82 y.o. PCP: Aldine Contes, MD  Date of Admission: 01/15/2016  1:48 PM Date of Discharge: 01/18/2016 Attending Physician: Axel Filler, MD  Discharge Diagnosis: 1. Right femoral neck fracture  Principal Problem:   Fracture of femoral neck, right (HCC) Active Problems:   Hypothyroidism   Essential hypertension   Barrett's esophagus   History of DVT (deep vein thrombosis)   PAC (premature atrial contraction)   Insomnia   Recurrent UTI vs overactive bladder   Delirium  Discharge Medications:   Medication List    STOP taking these medications        ALPRAZolam 1 MG tablet  Commonly known as:  XANAX     sulfamethoxazole-trimethoprim 400-80 MG tablet  Commonly known as:  BACTRIM,SEPTRA      TAKE these medications        alendronate 70 MG tablet  Commonly known as:  FOSAMAX  Take 1 tablet (70 mg total) by mouth once a week. Take with a full glass of water on an empty stomach.     CALTRATE 600+D 600-400 MG-UNIT tablet  Generic drug:  Calcium Carbonate-Vitamin D  Take 1 tablet by mouth daily.     conjugated estrogens vaginal cream  Commonly known as:  PREMARIN  Place 1 Applicatorful vaginally daily.     ferrous sulfate 325 (65 FE) MG tablet  Commonly known as:  FERROUSUL  Take 1 tablet (325 mg total) by mouth 2 (two) times daily with a meal.     FISH OIL PO  Take 1 capsule by mouth 2 (two) times daily.     HYDROcodone-acetaminophen 5-325 MG tablet  Commonly known as:  NORCO/VICODIN  Take 1-2 tablets by mouth every 6 (six) hours as needed for moderate pain.     levothyroxine 88 MCG tablet  Commonly known as:  SYNTHROID, LEVOTHROID  TAKE ONE TABLET BY MOUTH EVERY MORNING     MAGNESIUM PO  Take 1 tablet by mouth daily.     metoprolol succinate 50 MG 24 hr tablet  Commonly known as:  TOPROL-XL  Take 1 tablet (50 mg total) by mouth daily.     NEURONTIN 300 MG capsule  Generic drug:  gabapentin   Take 600 mg by mouth at bedtime.     NEXIUM 40 MG capsule  Generic drug:  esomeprazole  Take 40 mg by mouth 2 (two) times daily before a meal.     ondansetron 4 MG tablet  Commonly known as:  ZOFRAN  Take 1 tablet (4 mg total) by mouth every 6 (six) hours as needed for nausea.     oxybutynin 5 MG 24 hr tablet  Commonly known as:  DITROPAN-XL  Take 1 tablet (5 mg total) by mouth at bedtime.     polyethylene glycol packet  Commonly known as:  MIRALAX / GLYCOLAX  Take 17 g by mouth 2 (two) times daily.     rivaroxaban 10 MG Tabs tablet  Commonly known as:  XARELTO  Take 1 tablet (10 mg total) by mouth daily. Until July 29th     senna-docusate 8.6-50 MG tablet  Commonly known as:  Senokot-S  Take 2 tablets by mouth at bedtime.     simvastatin 10 MG tablet  Commonly known as:  ZOCOR  Take 10 mg by mouth daily at 6 PM.     Vitamin D3 400 units tablet  Take 2 tablets (800 Units total) by mouth daily.  Disposition and follow-up:   Carolyn [name] was discharged from [facility] in Good condition.  At the hospital follow up visit please address:  1.  Right femoral neck fracture- Please make an appointment to see Dr Ninfa Linden when patient is discharged from rehab. Follow orthopedics instructions   History of DVT- patient is discharged on Xarelto 10 gm daily until July 29th  Osteoporosis: Started weekly Fosamax- please adhere to the instructions on the bottle . Also started 800 IU vitamin D daily  Hypertension: continue metoprolol 50 mg daily. The metoprolol was increased from 50 mg to 100 mg at most recent visit. However she is normotensive on metoprolol 50 mg, so we will continue metoprolol 50 mg until seen in the clinic.     2.  Labs / imaging needed at time of follow-up:   3.  Pending labs/ test needing follow-up: Vit D level  Follow-up Appointments:     Follow-up Information    Follow up with Mcarthur Rossetti, MD. Schedule an  appointment as soon as possible for a visit in 2 weeks.   Specialty:  Orthopedic Surgery   Why:  once you are out of rehab   Contact information:   [place] [address] [phone]       Follow up with Burgess Estelle, MD On 02/01/2016.   Specialty:  Internal Medicine   Why:  hospital follow up at 10:45 AM    Contact information:   [address] [place] [address] [phone]       Discharge Instructions: Discharge Instructions    Call MD for:  persistant nausea and vomiting    Complete by:  As directed      Call MD for:  redness, tenderness, or signs of infection (pain, swelling, redness, odor or green/yellow discharge around incision site)    Complete by:  As directed      Call MD for:  severe uncontrolled pain    Complete by:  As directed      Call MD for:  temperature >100.4    Complete by:  As directed      Diet - low sodium heart healthy    Complete by:  As directed      Increase activity slowly    Complete by:  As directed            Consultations: Treatment Team:  Mcarthur Rossetti, MD  Procedures Performed:  Dg Chest 1 View  01/15/2016  CLINICAL DATA:  Patient fell today.  Hip pain. EXAM: CHEST 1 VIEW COMPARISON:  07/14/2015 FINDINGS: Numerous naps overlie the chest. Lungs are hyperexpanded. Atelectasis or scarring noted left base. Interstitial markings are diffusely coarsened with chronic features. The cardiopericardial silhouette is within normal limits for size. Small hiatal hernia noted. Bones are diffusely demineralized. IMPRESSION: Emphysema with atelectasis or scarring at the left base. Small hiatal hernia. Electronically Signed   By: Misty Stanley M.D.   On: 01/15/2016 16:17   Dg Shoulder Right  01/16/2016  CLINICAL DATA:  82 year old female with a history of fall EXAM: RIGHT SHOULDER - 2+ VIEW COMPARISON:  None. FINDINGS: No acute fracture.  Glenohumeral joint appears congruent. Degenerative changes of the  acromioclavicular joint and glenohumeral joint. Osteopenia. IMPRESSION: No acute bony abnormality. Signed, Dulcy Fanny. Earleen Newport, DO [facility] Electronically Signed   By: Corrie Mckusick D.O.   On: 01/16/2016 18:26   Pelvis Portable  01/15/2016  CLINICAL DATA:  Status  post right hip surgery.  Initial encounter. EXAM: PORTABLE PELVIS 1-2 VIEWS COMPARISON:  Pelvis radiograph performed 02/07/2014 FINDINGS: The patient is status post right hip arthroplasty. The arthroplasty is noted in expected position, without evidence of loosening or new fracture. Overlying skin staples and scattered soft tissue air are seen. The patient's left hip arthroplasty is unremarkable in appearance. The visualized bowel gas pattern is grossly unremarkable in appearance. IMPRESSION: Status post right hip arthroplasty.  No evidence of new fracture. Electronically Signed   By: Garald Balding M.D.   On: 01/15/2016 22:52   Dg Hip Operative Unilat W Or W/o Pelvis Right  01/16/2016  CLINICAL DATA:  Right hip hemiarthroplasty.  Initial encounter. EXAM: OPERATIVE RIGHT HIP (WITH PELVIS IF PERFORMED) 1 VIEW TECHNIQUE: Fluoroscopic spot image(s) were submitted for interpretation post-operatively. COMPARISON:  Right hip radiographs performed earlier today at 3:29 p.m. FINDINGS: Five fluoroscopic C-arm images are provided from the OR, demonstrating placement of a right hip hemiarthroplasty. Overlying postoperative soft tissue air is noted. No new fractures are seen. The visualized portions of the patient's left hip hemiarthroplasty is grossly unremarkable. IMPRESSION: Status post placement of right hip hemiarthroplasty. No new fracture seen. Electronically Signed   By: Garald Balding M.D.   On: 01/16/2016 00:56   Dg Hip Unilat With Pelvis 2-3 Views Right  01/15/2016  CLINICAL DATA:  RIGHT hip pain after falling today, initial encounter EXAM: DG HIP (WITH OR WITHOUT PELVIS) 2-3V RIGHT  COMPARISON:  None FINDINGS: Marked osseous demineralization. LEFT hip prosthesis. SI joints preserved. Displaced subcapital RIGHT femoral neck fracture. No dislocation. Pelvis appears intact. Scattered atherosclerotic calcifications aorta and iliac arteries. IMPRESSION: Osseous demineralization with displaced subcapital fracture RIGHT femoral neck. Prior LEFT hip replacement. Aortic atherosclerosis. Electronically Signed   By: Lavonia Dana M.D.   On: 01/15/2016 16:19    2D Echo:   Cardiac Cath:   Admission HPI:   82 yo patient with HTN, hypothyroidism, HLD, Depression, ?COPD who presents after a fall resulting in a hip fracture.  She says at around 12 PM, she was in the kitchen sink, and was walking and thinks she tripped her legs and fell. She was wearing her tennis shoes so does not believe she slipped on the floor. She fell forwards and hit her lead, but no LOC.   Right prior to the fall, patient denies any dizziness, spinning, palpitations, and no LOC. She was talking with her great grand daughter when she fell and at home there was no one. She laid on the floor for about 30 minutes before her family arrived.   She is pretty active at baseline and does all her ADLs by herself. She uses a cane for ambulation, and she denies any prior history of falls int he last 3 months. She has had left hip replacement 2 years ago by Dr Lorin Mercy. That time she preferred home health PT. Daughter says she will move in with her after the surgery for recovery.   Patient denies fevers, chills, n/v/d,. Other ROS negative. Denies SOB.   Currently she describes her pain as 7/10 inr ight hip.   FH: Afib in daughter, Mi in brother  SH: former smoker with around 30 pack year smoking history. No alcohol use or illicit drug use  Used to work for [employer] and now retired.   Hospital Course by problem list: Principal Problem:   Fracture of femoral neck, right (HCC) Active Problems:   Hypothyroidism    Essential hypertension   Barrett's esophagus  History of DVT (deep vein thrombosis)   PAC (premature atrial contraction)   Insomnia   Recurrent UTI vs overactive bladder   Delirium   Right femoral neck fracture: Seen on xray. Underwent hemiarthroplasty 6/23 and tolerated surgery well.  She is seen by orthopaedics and recommended for discharge to SNF. Vit D 800 IU was started on Saturday, and bisphophonate therapy on discharge   Post-operative delirium: On postop day 2, she had some altered mental status.  Suspect polypharmacy is contributory as she received mirtazipine early morning of POD 1 and then alprazalom the night before night along with ramelteon. UA and lab work was unremarkable. It was resolved the following day after some of the hypnotics were stopped   History of DVT: Patient was on Eliquis at some point int he past, and given her history of DVT, we recommend full dose anticoagulation for 35 days postop, and so we have discontinued aspirin and will start anticoagulation. This calculated using CAPRINI score. However, she has a history of GAVE as shown in the EGD. So she has moderate risk of bleeding but her risk of DVT outweighs the potential risk of bleeding. Given xarelto on discharge.  Osteoporosis: Her fall resulting in a fracture qualifies it as osteoporosis. Given vit D 800 IU daily to reduce fall risk and alendronate on discharge   Urge incontinence and history of recurrent UTI- bactrim stopped and continue oxybutynin 5 mg qhs   Depression/anxiety- per clinic note, tapering xanax. Recd #10 for this month. Received one dose of xanax.  Hypertension: BP stable. We Continued metoprolol 50 mg daily  Discharge Vitals:   BP 109/60 mmHg  Pulse 84  Temp(Src) 97.7 F (36.5 C) (Oral)  Resp 16  SpO2 99%  Discharge Labs:  Results for orders placed or performed during the hospital encounter of 01/15/16 (from the past 24 hour(s))  Urinalysis, Routine w reflex microscopic (not  at [facility])     Status: Abnormal   Collection Time: 01/17/16  3:21 PM  Result Value Ref Range   Color, Urine YELLOW YELLOW   APPearance CLOUDY (A) CLEAR   Specific Gravity, Urine 1.021 1.005 - 1.030   pH 7.5 5.0 - 8.0   Glucose, UA NEGATIVE NEGATIVE mg/dL   Hgb urine dipstick NEGATIVE NEGATIVE   Bilirubin Urine NEGATIVE NEGATIVE   Ketones, ur 15 (A) NEGATIVE mg/dL   Protein, ur NEGATIVE NEGATIVE mg/dL   Nitrite NEGATIVE NEGATIVE   Leukocytes, UA SMALL (A) NEGATIVE  Urine microscopic-add on     Status: Abnormal   Collection Time: 01/17/16  3:21 PM  Result Value Ref Range   Squamous Epithelial / LPF 6-30 (A) NONE SEEN   WBC, UA 6-30 0 - 5 WBC/hpf   RBC / HPF NONE SEEN 0 - 5 RBC/hpf   Bacteria, UA RARE (A) NONE SEEN  CBC     Status: Abnormal   Collection Time: 01/18/16  5:43 AM  Result Value Ref Range   WBC 9.7 4.0 - 10.5 K/uL   RBC 3.62 (L) 3.87 - 5.11 MIL/uL   Hemoglobin 11.0 (L) 12.0 - 15.0 g/dL   HCT 35.1 (L) 36.0 - 46.0 %   MCV 97.0 78.0 - 100.0 fL   MCH 30.4 26.0 - 34.0 pg   MCHC 31.3 30.0 - 36.0 g/dL   RDW 13.9 11.5 - 15.5 %   Platelets 148 (L) 150 - 400 K/uL  Glucose, capillary     Status: Abnormal   Collection Time: 01/18/16  6:25 AM  Result Value  Ref Range   Glucose-Capillary 104 (H) 65 - 99 mg/dL    Signed: Burgess Estelle, MD 01/18/2016, 11:01 AM    Services Ordered on Discharge:  Equipment Ordered on Discharge:

## 2016-01-18 NOTE — Clinical Social Work Placement (Signed)
   CLINICAL SOCIAL WORK PLACEMENT  NOTE  Date:  01/18/2016  Patient Details  Name: Carolyn [name] MRN: [id number] Date of Birth: 1930/12/23  Clinical Social Work is seeking post-discharge placement for this patient at the [place] level of care (*CSW will initial, date and re-position this form in  chart as items are completed):  Yes   Patient/family provided with [facility] Work Department's list of facilities offering this level of care within the geographic area requested by the patient (or if unable, by the patient's family).  Yes   Patient/family informed of their freedom to choose among providers that offer the needed level of care, that participate in Medicare, Medicaid or managed care program needed by the patient, have an available bed and are willing to accept the patient.  Yes   Patient/family informed of [facility]'s ownership interest in [facility] and [facility], as well as of the fact that they are under no obligation to receive care at these facilities.  PASRR submitted to EDS on       PASRR number received on       Existing PASRR number confirmed on 01/18/16     FL2 transmitted to all facilities in geographic area requested by pt/family on 01/18/16     FL2 transmitted to all facilities within larger geographic area on       Patient informed that his/her managed care company has contracts with or will negotiate with certain facilities, including the following:        Yes   Patient/family informed of bed offers received.  Patient chooses bed at [facility]     Physician recommends and patient chooses bed at      Patient to be transferred to [facility] on 01/18/16.  Patient to be transferred to facility by [facility]     Patient family notified on 01/18/16 of transfer.  Name of family member notified:  Katharine Look     PHYSICIAN Please sign FL2     Additional Comment:     _______________________________________________ Caroline Sauger, LCSW 01/18/2016, 11:39 AM

## 2016-01-18 NOTE — NC FL2 (Signed)
[place] MEDICAID FL2 LEVEL OF CARE SCREENING TOOL     IDENTIFICATION  Patient Name: Carolyn [name] Birthdate: 11/29/1930 Sex: female Admission Date (Current Location): 01/15/2016  [facility] and [place] Number:  Herbalist and Address:  [facility] [place] [address] [place], [place] [address]      Provider Number: [id number]  Attending Physician Name and Address:  Axel Filler, MD  Relative Name and Phone Number:       Current Level of Care: Hospital Recommended Level of Care: [place] Prior Approval Number:    Date Approved/Denied:   PASRR Number: [id number]  Discharge Plan: SNF    Current Diagnoses: Patient Active Problem List   Diagnosis Date Noted  . Delirium 01/17/2016  . Hyperlipidemia 12/08/2015  . Insomnia 12/08/2015  . Recurrent UTI vs overactive bladder 12/08/2015  . PAC (premature atrial contraction) 09/09/2015  . GAVE (gastric antral vascular ectasia) 01/16/2015  . Hx of adenomatous colonic polyps   . History of DVT (deep vein thrombosis) 10/02/2014  . Anemia, normocytic normochromic   . Barrett's esophagus   . Fracture of femoral neck, right ([place]) 02/07/2014  . Hypothyroidism 02/07/2014  . Essential hypertension 02/07/2014    Orientation RESPIRATION BLADDER Height & Weight     Self, Time, Situation, Place  Normal Incontinent Weight:   Height:     BEHAVIORAL SYMPTOMS/MOOD NEUROLOGICAL BOWEL NUTRITION STATUS      Continent Diet (Please see discharge summary.)  AMBULATORY STATUS COMMUNICATION OF NEEDS Skin   Limited Assist Verbally Surgical wounds                       Personal Care Assistance Level of Assistance  Bathing, Feeding, Dressing Bathing Assistance: Maximum assistance Feeding assistance: Independent Dressing Assistance: Maximum assistance     Functional Limitations Info             SPECIAL CARE FACTORS FREQUENCY  PT (By licensed PT), OT (By licensed OT)      PT Frequency: 5 OT Frequency: 5            Contractures      Additional Factors Info  Code Status, Allergies Code Status Info: FULL Allergies Info: Penicillins           Current Medications (01/18/2016):  This is the current hospital active medication list Current Facility-Administered Medications  Medication Dose Route Frequency Provider Last Rate Last Dose  . acetaminophen (TYLENOL) tablet 650 mg  650 mg Oral Q6H PRN Mcarthur Rossetti, MD   650 mg at 01/18/16 [id number]   Or  . acetaminophen (TYLENOL) suppository 650 mg  650 mg Rectal Q6H PRN Mcarthur Rossetti, MD      . cholecalciferol (VITAMIN D) tablet 800 Units  800 Units Oral Daily Burgess Estelle, MD   800 Units at 01/18/16 0906  . enoxaparin (LOVENOX) injection 30 mg  30 mg Subcutaneous Q12H Axel Filler, MD   30 mg at 01/17/16 2254  . gabapentin (NEURONTIN) capsule 600 mg  600 mg Oral QHS Riccardo Dubin, MD   600 mg at 01/17/16 2254  . HYDROcodone-acetaminophen (NORCO/VICODIN) 5-325 MG per tablet 1-2 tablet  1-2 tablet Oral Q6H PRN Mcarthur Rossetti, MD   1 tablet at 01/16/16 1728  . levothyroxine (SYNTHROID, LEVOTHROID) tablet 88 mcg  88 mcg Oral QAC breakfast Riccardo Dubin, MD   88 mcg at 01/18/16 0906  . menthol-cetylpyridinium (CEPACOL) lozenge 3 mg  1 lozenge  Oral PRN Mcarthur Rossetti, MD       Or  . phenol [facility]) mouth spray 1 spray  1 spray Mouth/Throat PRN Mcarthur Rossetti, MD      . metoprolol succinate (TOPROL-XL) 24 hr tablet 50 mg  50 mg Oral Daily Riccardo Dubin, MD   50 mg at 01/18/16 0907  . morphine 2 MG/ML injection 0.5 mg  0.5 mg Intravenous Q2H PRN Mcarthur Rossetti, MD      . ondansetron [facility]) tablet 4 mg  4 mg Oral Q6H PRN Mcarthur Rossetti, MD       Or  . ondansetron [facility]) injection 4 mg  4 mg Intravenous Q6H PRN Mcarthur Rossetti, MD      . pantoprazole (PROTONIX) EC tablet 80 mg  80 mg Oral Q1200 Riccardo Dubin, MD   80 mg at 01/16/16  1013  . ramelteon (ROZEREM) tablet 8 mg  8 mg Oral QHS Burgess Estelle, MD   8 mg at 01/16/16 2232  . senna-docusate (Senokot-S) tablet 2 tablet  2 tablet Oral QHS Riccardo Dubin, MD   2 tablet at 01/17/16 2255  . simvastatin (ZOCOR) tablet 10 mg  10 mg Oral q1800 Riccardo Dubin, MD   10 mg at 01/17/16 1716  . sodium chloride flush (NS) 0.9 % injection 3 mL  3 mL Intravenous Q12H Burgess Estelle, MD         Discharge Medications: Please see discharge summary for a list of discharge medications.  Relevant Imaging Results:  Relevant Lab Results:   Additional Information SSN: [id number]  Caroline Sauger, LCSW

## 2016-01-18 NOTE — Progress Notes (Addendum)
Subjective:  No acute events overnight. Says pain is much improved. Does not feel confused anymore.  Was initially not wanting to go to SNF but explained the rationale and benefits of SNF  Objective: Vital signs in last 24 hours: Filed Vitals:   01/17/16 2000 01/17/16 2312 01/18/16 0100 01/18/16 0636  BP:  136/57  109/60  Pulse:  85  84  Temp:  97.5 F (36.4 C)  97.7 F (36.5 C)  TempSrc:  Oral    Resp:  15  16  SpO2: 95% 100% 96% 99%   Weight change:   Intake/Output Summary (Last 24 hours) at 01/18/16 1016 Last data filed at 01/18/16 0900  Gross per 24 hour  Intake    620 ml  Output      0 ml  Net    620 ml   General: Resting in bed, conversant Cardiovascular: Regular rate, rhythm, no murmur appreciated  Abdominal: Soft, non-tender, non-distended, BS + Extremities: No lower extremity edema bilaterally, pulses symmetric and intact bilaterally. Skin: R hip with bandage overlying incision site that is clean, dry, intact underneath.  Lab Results: reviewed Micro Results: Recent Results (from the past 240 hour(s))  Surgical pcr screen     Status: None   Collection Time: 01/15/16  9:08 PM  Result Value Ref Range Status   MRSA, PCR NEGATIVE NEGATIVE Final   Staphylococcus aureus NEGATIVE NEGATIVE Final    Comment:        The Xpert SA Assay (FDA approved for NASAL specimens in patients over 11 years of age), is one component of a comprehensive surveillance program.  Test performance has been validated by [facility] for patients greater than or equal to 53 year old. It is not intended to diagnose infection nor to guide or monitor treatment.    Studies/Results: Dg Shoulder Right  01/16/2016  CLINICAL DATA:  82 year old female with a history of fall EXAM: RIGHT SHOULDER - 2+ VIEW COMPARISON:  None. FINDINGS: No acute fracture.  Glenohumeral joint appears congruent. Degenerative changes of the acromioclavicular joint and glenohumeral joint. Osteopenia. IMPRESSION:  No acute bony abnormality. Signed, Dulcy Fanny. Earleen Newport, DO [facility] Electronically Signed   By: Corrie Mckusick D.O.   On: 01/16/2016 18:26   Medications: I have reviewed the patient's current medications. Scheduled Meds: . cholecalciferol  800 Units Oral Daily  . enoxaparin (LOVENOX) injection  30 mg Subcutaneous Q12H  . gabapentin  600 mg Oral QHS  . levothyroxine  88 mcg Oral QAC breakfast  . metoprolol succinate  50 mg Oral Daily  . pantoprazole  80 mg Oral Q1200  . ramelteon  8 mg Oral QHS  . senna-docusate  2 tablet Oral QHS  . simvastatin  10 mg Oral q1800  . sodium chloride flush  3 mL Intravenous Q12H   Continuous Infusions:  PRN Meds:.acetaminophen **OR** acetaminophen, HYDROcodone-acetaminophen, menthol-cetylpyridinium **OR** phenol, morphine injection, ondansetron **OR** ondansetron (ZOFRAN) IV Assessment/Plan: Principal Problem:   Fracture of femoral neck, right (HCC) Active Problems:   Hypothyroidism   Essential hypertension   Barrett's esophagus   History of DVT (deep vein thrombosis)   PAC (premature atrial contraction)   Insomnia   Recurrent UTI vs overactive bladder   Delirium  Right femoral neck fracture: S/p hemiarthroplasty 6/23, now POD3.  She is seen by orthopaedics this morning and cleared for discharge to SNF. Both PT and ortho recommend SNF. Vit D 800 IU was started on Saturday, and bisphophonate therapy on discharge  Post-operative delirium:  Resolved this morning after some of the hypnotics were stopped   History of DVT: Patient was on Eliquis at some point int he past, and given her history of DVT, we recommend full dose anticoagulation for 35 days postop, and so we have discontinued aspirin and will start anticoagulation. This calculated using CAPRINI score. However, she has a history of GAVE as shown in the EGD. So she has moderate risk of bleeding but her risk of DVT outweighs the potential risk  of bleeding  -xarelto on discharge 6/24-7/29  Osteoporosis: Her fall resulting in a fracture qualifies it as osteoporosis  -vit D 800 IU daily to reduce fall risk -alendronate on discharge    Hypertension: BP stable -Continue metoprolol 50 mg daily  Dispo: Disposition is deferred at this time, awaiting improvement of current medical problems.    The patient does have a current PCP (Aldine Contes, MD) and does need an [facility] hospital follow-up appointment after discharge.  The patient does not have transportation limitations that hinder transportation to clinic appointments.  .Services Needed at time of discharge: Y = Yes, Blank = No PT:   OT:   RN:   Equipment:   Other:     LOS: 3 days   Burgess Estelle, MD 01/18/2016, 10:16 AM

## 2016-01-19 ENCOUNTER — Telehealth: Payer: Self-pay | Admitting: Internal Medicine

## 2016-01-19 NOTE — Telephone Encounter (Signed)
Pt need TOC Discharge Date 01/18/16 HFU 02/01/16

## 2016-01-20 ENCOUNTER — Telehealth: Payer: Self-pay | Admitting: Pharmacist

## 2016-01-22 ENCOUNTER — Encounter: Payer: Self-pay | Admitting: Internal Medicine

## 2016-01-22 ENCOUNTER — Non-Acute Institutional Stay (SKILLED_NURSING_FACILITY): Payer: Self-pay | Admitting: Internal Medicine

## 2016-01-22 DIAGNOSIS — M810 Age-related osteoporosis without current pathological fracture: Secondary | ICD-10-CM

## 2016-01-22 DIAGNOSIS — D62 Acute posthemorrhagic anemia: Secondary | ICD-10-CM

## 2016-01-22 DIAGNOSIS — K219 Gastro-esophageal reflux disease without esophagitis: Secondary | ICD-10-CM

## 2016-01-22 DIAGNOSIS — K5901 Slow transit constipation: Secondary | ICD-10-CM

## 2016-01-22 DIAGNOSIS — N3281 Overactive bladder: Secondary | ICD-10-CM

## 2016-01-22 DIAGNOSIS — E785 Hyperlipidemia, unspecified: Secondary | ICD-10-CM

## 2016-01-22 DIAGNOSIS — I10 Essential (primary) hypertension: Secondary | ICD-10-CM

## 2016-01-22 DIAGNOSIS — R2681 Unsteadiness on feet: Secondary | ICD-10-CM

## 2016-01-22 DIAGNOSIS — M792 Neuralgia and neuritis, unspecified: Secondary | ICD-10-CM

## 2016-01-22 DIAGNOSIS — S72001S Fracture of unspecified part of neck of right femur, sequela: Secondary | ICD-10-CM

## 2016-01-22 DIAGNOSIS — E871 Hypo-osmolality and hyponatremia: Secondary | ICD-10-CM

## 2016-01-22 DIAGNOSIS — E039 Hypothyroidism, unspecified: Secondary | ICD-10-CM

## 2016-01-22 NOTE — Progress Notes (Signed)
LOCATION: [facility]  PCP: Aldine Contes, MD   Code Status: Full Code   Goals of care: Advanced Directive information Advanced Directives 01/15/2016  Does patient have an advance directive? No  Type of Advance Directive -  Does patient want to make changes to advanced directive? -  Copy of advanced directive(s) in chart? -       Extended Emergency Contact Information Primary Emergency Contact: Hincher,Sandra Address: [address]          [place], [place] [address] [place] Phone: [phone] Mobile Phone: [phone] Relation: Daughter Secondary Emergency Contact: Meeks,Steven Address: [address]          [place], [place] [address] [place] Phone: [phone] Mobile Phone: [phone] Relation: Son   Allergies  Allergen Reactions  . Penicillins Hives    Chief Complaint  Patient presents with  . New Admit To SNF    New Admission     HPI:  Patient is a 82 y.o. female seen today for short term rehabilitation post hospital admission from 01/15/16-01/18/16 with right femoral neck fracture. She underwent surgical repair. She is seen in her room today with her son at bedside.   Review of Systems:  Constitutional: Negative for fever, chills, diaphoresis. Energy level is slowly coming back.  HENT: Negative for headache, congestion, difficulty swallowing. Positive for nasal discharge and hearing loss.   Eyes: Negative for blurred vision, double vision and discharge.  Respiratory: Negative for cough, shortness of breath and wheezing.   Cardiovascular: Negative for chest pain, palpitations, leg swelling.  Gastrointestinal: Negative for heartburn, nausea, vomiting, abdominal pain. Las bowel movement was yesterday.  Genitourinary: Negative for dysuria and flank pain.  Musculoskeletal: Negative for back pain, fall in the facility.  Skin: Negative for itching, rash.  Neurological:  Positive for occasional dizziness  with change of position. Psychiatric/Behavioral: Negative for depression    Past Medical History  Diagnosis Date  . Hypertension   . High cholesterol   . Hypothyroidism   . Asthma     "only when I smoked"  . Hiatal hernia     s/p gastropexy after failed Nissen fundoplication.   . Barrett's esophagus since at least 1985  . Arthritis 11/2013.     "both knees" (11/26/2013)  . Depression with anxiety 11/2013    "little bit"   . COPD (chronic obstructive pulmonary disease) ([place])   . Pneumonia 11/2013  . Diverticulosis of colon 1996  . CKD (chronic kidney disease)     GFR 55 11/2014   . GAVE (gastric antral vascular ectasia) 01/16/2015  . Hx of adenomatous colonic polyps    Past Surgical History  Procedure Laterality Date  . Nissen fundoplication      this ultimately failed and she underwent gastropexy  . Cholecystectomy    . Bladder suspension      .  bladder tack x 3.   . Abdominal hysterectomy    . Cataract extraction w/ intraocular lens  implant, bilateral Bilateral   . Appendectomy    . Hip arthroplasty Left 02/07/2014    Procedure: LEFT HIP PRESS FIT MONOPOLAR HEMIARTHROPLASTY ;  Surgeon: Marybelle Killings, MD;  Location: [facility];  Service: Orthopedics;  Laterality: Left;  . Esophagogastroduodenoscopy N/A 09/01/2014    Procedure: ESOPHAGOGASTRODUODENOSCOPY (EGD);  Surgeon: Lafayette Dragon, MD;  Location: [facility];  Service: Endoscopy;  Laterality: N/A;  . Gastropexy  1989  . Enterocele repair  2003    with  colopexy.   . Colonoscopy N/A 01/16/2015    Procedure: COLONOSCOPY;  Surgeon: Gatha Mayer, MD;  [facility] END[facility]COPY;  Service: Endoscopy;  Laterality: N/A;  . Esophagogastroduodenoscopy N/A 01/16/2015    Procedure: ESOPHAGOGASTRODUODENOSCOPY (EGD);  Surgeon: Gatha Mayer, MD;  Location: [facility] ENDOSCOPY;  Service: Endoscopy;  Laterality: N/A;  . Anterior approach hemi hip arthroplasty Right 01/15/2016    Procedure: ANTERIOR APPROACH HEMI HIP ARTHROPLASTY;  Surgeon: Mcarthur Rossetti, MD;  Location: [facility];  Service: Orthopedics;  Laterality: Right;   Social History:   reports that she has quit smoking. Her smoking use included Cigarettes. She has a 10 pack-year smoking history. She has never used smokeless tobacco. She reports that she does not drink alcohol or use illicit drugs.  Family History  Problem Relation Age of Onset  . Heart disease Mother   . Heart disease Father   . Heart disease Brother   . Bladder Cancer Sister   . Heart disease Brother   . Stomach cancer Mother     Medications:   Medication List       This list is accurate as of: 01/22/16  2:58 PM.  Always use your most recent med list.               alendronate 70 MG tablet  Commonly known as:  FOSAMAX  Take 1 tablet (70 mg total) by mouth once a week. Take with a full glass of water on an empty stomach.     CALTRATE 600+D 600-400 MG-UNIT tablet  Generic drug:  Calcium Carbonate-Vitamin D  Take 1 tablet by mouth daily.     conjugated estrogens vaginal cream  Commonly known as:  PREMARIN  Place 1 Applicatorful vaginally daily.     HYDROcodone-acetaminophen 5-325 MG tablet  Commonly known as:  NORCO/VICODIN  Take 1-2 tablets by mouth every 6 (six) hours as needed for moderate pain.     levothyroxine 88 MCG tablet  Commonly known as:  SYNTHROID, LEVOTHROID  TAKE ONE TABLET BY MOUTH EVERY MORNING     MAGNESIUM PO  Take 1 tablet by mouth daily.     metoprolol succinate 50 MG 24 hr tablet  Commonly known as:  TOPROL-XL  Take 50 mg by mouth daily. Take with or immediately following a meal.     gabapentin 300 MG capsule  Commonly known as:  NEURONTIN  Take 600 mg by mouth at bedtime.     NEURONTIN 300 MG capsule  Generic drug:  gabapentin  Take 600 mg by mouth at bedtime.     omeprazole 20 MG capsule  Commonly known as:  PRILOSEC  Take 20 mg by mouth 2 (two) times daily.     ondansetron 4 MG tablet  Commonly known as:  ZOFRAN  Take 1 tablet (4 mg total) by mouth  every 6 (six) hours as needed for nausea.     oxybutynin 5 MG 24 hr tablet  Commonly known as:  DITROPAN-XL  Take 1 tablet (5 mg total) by mouth at bedtime.     polyethylene glycol packet  Commonly known as:  MIRALAX / GLYCOLAX  Take 17 g by mouth 2 (two) times daily.     rivaroxaban 10 MG Tabs tablet  Commonly known as:  XARELTO  Take 1 tablet (10 mg total) by mouth daily. Until July 29th     senna-docusate 8.6-50 MG tablet  Commonly known as:  Senokot-S  Take 2 tablets by mouth at bedtime.  simvastatin 10 MG tablet  Commonly known as:  ZOCOR  Take 10 mg by mouth daily at 6 PM.     Vitamin D3 400 units tablet  Take 2 tablets (800 Units total) by mouth daily.        Immunizations: Immunization History  Administered Date(s) Administered  . PPD Test 01/18/2016  . Pneumococcal Polysaccharide-23 11/27/2013     Physical Exam:  Filed Vitals:   01/22/16 1450  BP: 125/75  Pulse: 80  Temp: 98.6 F (37 C)  TempSrc: Oral  Resp: 20  Height: 5\' 8"  (1.727 m)  Weight: 163 lb 14.4 oz (74.345 kg)  SpO2: 98%   Body mass index is 24.93 kg/(m^2).  General- elderly female, well built, in no acute distress Head- normocephalic, atraumatic Nose-  no nasal discharge Throat- moist mucus membrane, dentures present Eyes- PERRLA, EOMI, no pallor, no icterus, no discharge, normal conjunctiva, normal sclera Neck- no cervical lymphadenopathy Cardiovascular- normal s1,s2, no murmur, 1+ leg edema Respiratory- bilateral clear to auscultation, no wheeze, no rhonchi, no crackles, no use of accessory muscles Abdomen- bowel sounds present, soft, non tender Musculoskeletal- able to move all 4 extremities, limited right leg range of motion Neurological- alert and oriented to person, place and time Skin- warm and dry, right hip aquacel dressing in place Psychiatry- normal mood and affect    Labs reviewed: Basic Metabolic Panel:  Recent Labs  01/15/16 1450 01/16/16 0311  01/17/16 0833  NA 131* 132* 132*  K 4.8 4.0 4.3  CL 98* 98* 97*  CO2 26 26 27   GLUCOSE 101* 119* 105*  BUN 13 10 11   CREATININE 1.06* 0.89 0.95  CALCIUM 10.0 8.9 8.9   Liver Function Tests:  Recent Labs  01/15/16 1450  AST 29  ALT 14  ALKPHOS 67  BILITOT 0.5  PROT 7.2  ALBUMIN 3.6   No results for input(s): LIPASE, AMYLASE in the last 8760 hours. No results for input(s): AMMONIA in the last 8760 hours. CBC:  Recent Labs  01/15/16 1450  01/16/16 0311 01/17/16 0833 01/18/16 0543  WBC 5.3  < > 10.9* 9.2 9.7  NEUTROABS 3.7  --   --   --   --   HGB 13.8  < > 12.7 13.1 11.0*  HCT 41.6  < > 38.9 40.6 35.1*  MCV 95.4  < > 95.6 96.9 97.0  PLT 193  < > 176 125* 148*  < > = values in this interval not displayed. Cardiac Enzymes: No results for input(s): CKTOTAL, CKMB, CKMBINDEX, TROPONINI in the last 8760 hours. BNP: Invalid input(s): POCBNP CBG:  Recent Labs  01/16/16 0635 01/17/16 0715 01/18/16 0625  GLUCAP 118* 88 104*    Radiological Exams: Dg Chest 1 View  01/15/2016  CLINICAL DATA:  Patient fell today.  Hip pain. EXAM: CHEST 1 VIEW COMPARISON:  07/14/2015 FINDINGS: Numerous naps overlie the chest. Lungs are hyperexpanded. Atelectasis or scarring noted left base. Interstitial markings are diffusely coarsened with chronic features. The cardiopericardial silhouette is within normal limits for size. Small hiatal hernia noted. Bones are diffusely demineralized. IMPRESSION: Emphysema with atelectasis or scarring at the left base. Small hiatal hernia. Electronically Signed   By: Misty Stanley M.D.   On: 01/15/2016 16:17   Dg Shoulder Right  01/16/2016  CLINICAL DATA:  82 year old female with a history of fall EXAM: RIGHT SHOULDER - 2+ VIEW COMPARISON:  None. FINDINGS: No acute fracture.  Glenohumeral joint appears congruent. Degenerative changes of the acromioclavicular joint and glenohumeral joint. Osteopenia. IMPRESSION:  No acute bony abnormality. Signed, Dulcy Fanny.  Earleen Newport, DO [facility] Electronically Signed   By: Corrie Mckusick D.O.   On: 01/16/2016 18:26   Pelvis Portable  01/15/2016  CLINICAL DATA:  Status post right hip surgery.  Initial encounter. EXAM: PORTABLE PELVIS 1-2 VIEWS COMPARISON:  Pelvis radiograph performed 02/07/2014 FINDINGS: The patient is status post right hip arthroplasty. The arthroplasty is noted in expected position, without evidence of loosening or new fracture. Overlying skin staples and scattered soft tissue air are seen. The patient's left hip arthroplasty is unremarkable in appearance. The visualized bowel gas pattern is grossly unremarkable in appearance. IMPRESSION: Status post right hip arthroplasty.  No evidence of new fracture. Electronically Signed   By: Garald Balding M.D.   On: 01/15/2016 22:52   Dg Hip Operative Unilat W Or W/o Pelvis Right  01/16/2016  CLINICAL DATA:  Right hip hemiarthroplasty.  Initial encounter. EXAM: OPERATIVE RIGHT HIP (WITH PELVIS IF PERFORMED) 1 VIEW TECHNIQUE: Fluoroscopic spot image(s) were submitted for interpretation post-operatively. COMPARISON:  Right hip radiographs performed earlier today at 3:29 p.m. FINDINGS: Five fluoroscopic C-arm images are provided from the OR, demonstrating placement of a right hip hemiarthroplasty. Overlying postoperative soft tissue air is noted. No new fractures are seen. The visualized portions of the patient's left hip hemiarthroplasty is grossly unremarkable. IMPRESSION: Status post placement of right hip hemiarthroplasty. No new fracture seen. Electronically Signed   By: Garald Balding M.D.   On: 01/16/2016 00:56   Dg Hip Unilat With Pelvis 2-3 Views Right  01/15/2016  CLINICAL DATA:  RIGHT hip pain after falling today, initial encounter EXAM: DG HIP (WITH OR WITHOUT PELVIS) 2-3V RIGHT COMPARISON:  None FINDINGS: Marked osseous demineralization. LEFT hip prosthesis. SI joints preserved. Displaced subcapital  RIGHT femoral neck fracture. No dislocation. Pelvis appears intact. Scattered atherosclerotic calcifications aorta and iliac arteries. IMPRESSION: Osseous demineralization with displaced subcapital fracture RIGHT femoral neck. Prior LEFT hip replacement. Aortic atherosclerosis. Electronically Signed   By: Lavonia Dana M.D.   On: 01/15/2016 16:19    Assessment/Plan  Unsteady gait Will have patient work with PT/OT as tolerated to regain strength and restore function.  Fall precautions are in place.  Right femoral neck fracture S/p surgical repair. Has orthopedics follow up. Will have her work with physical therapy and occupational therapy team to help with gait training and muscle strengthening exercises.fall precautions. Skin care. Encourage to be out of bed. Continue xarleto for dvt prophylaxis. Continue norco 5-325 mg 1-2 tab q6h prn pain. Add ted hose to help with leg edema  Blood loss anemia Post op, monitor cbc. Continue feso4 325 mg bid  Hyponatremia Monitor bmp  HTN Stable, monitor bp and continue metoprolol 50 mg daily  Hypothyroidism Continue levothyroxine  gerd Stable with omeprazole 20 mg bid  Neuropathic pain Continue neurontin 600 mg qhs  Constipation Continue miralax bid and senna s 2 tab qhs  Osteoporosis Continue weekly fosamax, daily vitamin d and daily caltrate, fall precautions  OAB Continue ditropan  HLD Continue her statin   Goals of care: short term rehabilitation   Labs/tests ordered: cbc, cmp 01/25/16  Family/ staff Communication: reviewed care plan with patient and nursing supervisor    Blanchie Serve, MD Internal Medicine [address], [place] [address] Cell Phone (Monday-Friday 8 am - 5 pm): [phone] On Call: [phone] and follow prompts after 5 pm and on weekends Office Phone: [phone] Office Fax: [phone]

## 2016-01-25 LAB — HEPATIC FUNCTION PANEL
ALK PHOS: 71 U/L (ref 25–125)
ALT: 20 U/L (ref 7–35)
AST: 28 U/L (ref 13–35)
Bilirubin, Total: 0.8 mg/dL

## 2016-01-25 LAB — CBC AND DIFFERENTIAL
HEMATOCRIT: 32 % — AB (ref 36–46)
Hemoglobin: 10.4 g/dL — AB (ref 12.0–16.0)
PLATELETS: 364 10*3/uL (ref 150–399)
WBC: 5.2 10^3/mL

## 2016-01-25 LAB — BASIC METABOLIC PANEL
BUN: 13 mg/dL (ref 4–21)
CREATININE: 0.6 mg/dL (ref <=1.1)
Glucose: 77 mg/dL
Potassium: 4.4 mmol/L (ref 3.4–5.3)
SODIUM: 1 mmol/L — AB (ref 137–147)

## 2016-01-25 NOTE — Telephone Encounter (Signed)
Contacted patient to provide education on rivaroxaban (Xarelto), spoke to patient's daughter, Katharine Look. Xarelto was reviewed with Katharine Look, including name, instructions, indication, goals of therapy, potential side effects, importance of adherence, and safe use.  Katharine Look verbalized understanding by repeating back information and was advised to contact me if further medication-related questions arise.

## 2016-01-25 NOTE — Telephone Encounter (Signed)
Transition Care Management Follow-up Telephone Call   Date discharged?01/18/2016   How have you been since you were released from the hospital?"real good, you know she is in a rest home right now"   Do you understand why you were in the hospital? yes   Do you understand the discharge instructions? Yes and no, i didn't get any paperwork the rest home picked her up and i guess they got the papers"   Where were you discharged to? Rest home   Items Reviewed:  Medications reviewed: yes, but i really dont know what she is taking and not taking, my only worry is that xarelto pill, what should i do"- pt's daughter is ask to request a medication list at the facility and ask if the xarelto is being given to pt, she will call back with the information and ask for triage  Allergies reviewed: yes  Dietary changes reviewed: yes  Referrals reviewed: yes   Functional Questionnaire:   Activities of Daily Living (ADLs):   She states they are independent in the following: dressing States they require assistance with the following: she gets help with everything right now   Any transportation issues/concerns?: no   Any patient concerns? no   Confirmed importance and date/time of follow-up visits scheduled confirmed date, time and directions to clinic  Provider Appointment booked with  Confirmed with patient if condition begins to worsen call PCP or go to the ER.  Patient was given the office number and encouraged to call back with question or concerns.  : yes, [phone] or ED

## 2016-01-25 NOTE — Telephone Encounter (Signed)
Pt's daughter called back and rest home is providing pt xarelto, she states she hopes pt will be disch home soon and then they will need refills at pharm, she will call clinic at that time

## 2016-02-01 ENCOUNTER — Ambulatory Visit: Payer: Medicare Other

## 2016-02-02 ENCOUNTER — Encounter: Payer: Self-pay | Admitting: Family

## 2016-02-02 ENCOUNTER — Non-Acute Institutional Stay (SKILLED_NURSING_FACILITY): Payer: Medicare Other | Admitting: Family

## 2016-02-02 DIAGNOSIS — N3281 Overactive bladder: Secondary | ICD-10-CM | POA: Diagnosis not present

## 2016-02-02 DIAGNOSIS — K5900 Constipation, unspecified: Secondary | ICD-10-CM | POA: Insufficient documentation

## 2016-02-02 DIAGNOSIS — M810 Age-related osteoporosis without current pathological fracture: Secondary | ICD-10-CM | POA: Diagnosis not present

## 2016-02-02 DIAGNOSIS — K5901 Slow transit constipation: Secondary | ICD-10-CM | POA: Diagnosis not present

## 2016-02-02 DIAGNOSIS — M792 Neuralgia and neuritis, unspecified: Secondary | ICD-10-CM

## 2016-02-02 DIAGNOSIS — E039 Hypothyroidism, unspecified: Secondary | ICD-10-CM

## 2016-02-02 DIAGNOSIS — I10 Essential (primary) hypertension: Secondary | ICD-10-CM | POA: Diagnosis not present

## 2016-02-02 DIAGNOSIS — K219 Gastro-esophageal reflux disease without esophagitis: Secondary | ICD-10-CM

## 2016-02-02 DIAGNOSIS — G4700 Insomnia, unspecified: Secondary | ICD-10-CM | POA: Diagnosis not present

## 2016-02-02 DIAGNOSIS — E785 Hyperlipidemia, unspecified: Secondary | ICD-10-CM | POA: Diagnosis not present

## 2016-02-02 DIAGNOSIS — S72001S Fracture of unspecified part of neck of right femur, sequela: Secondary | ICD-10-CM | POA: Diagnosis not present

## 2016-02-02 NOTE — Progress Notes (Signed)
Location: [facility] Room Number: [id number] Place of Service: SNF   Provider: Marlowe Sax  FNP-C  PCP: Aldine Contes, MD Patient Care Team: Aldine Contes, MD as PCP - General (Internal Medicine)  Extended Emergency Contact Information Primary Emergency Contact: Hincher,Sandra Address: [address]          [place], [place] [address] [place] Phone: [phone] Mobile Phone: [phone] Relation: Daughter Secondary Emergency Contact: Meeks,Steven Address: [address]          [place], [place] [address] [place] Phone: [phone] Mobile Phone: [phone] Relation: Son  Code Status: Full Code Goals of care:  Advanced Directive information Advanced Directives 01/15/2016  Does patient have an advance directive? No  Type of Advance Directive -  Does patient want to make changes to advanced directive? -  Copy of advanced directive(s) in chart? -     Allergies  Allergen Reactions  . Penicillins Hives    Chief Complaint  Patient presents with  . Discharge Note    HPI:  82 y.o. female seen today at  [facility] for discharge home. She was here for short term rehabilitation post hospital admission from 01/15/16-01/18/16 with right femoral neck fracture. She underwent surgical repair. She has a significant medical history of HTN, Hyperlipidemia, Osteoporosis, Hypothyroidism, COPD, CKD among other conditions. She is seen in her room today. She states right hip pain under control with current pain regimen. She denies any acute issues. She has worked well with PT/OT now stable for discharge home. She will be discharged home with Home health PT/OT to continue with ROM, Exercise, Gait stability and muscle strengthening. She does not require any  DME  Has own rolling walker. She will be discharged home with her medication from facility. Home health services will be arranged by facility  social worker prior to discharge. Prescription medication will be written x 1 month then patient to follow up with PCP in 1-2 weeks.    Past Medical History  Diagnosis Date  . Hypertension   . High cholesterol   . Hypothyroidism   . Asthma     "only when I smoked"  . Hiatal hernia     s/p gastropexy after failed Nissen fundoplication.   . Barrett's esophagus since at least 1985  . Arthritis 11/2013.     "both knees" (11/26/2013)  . Depression with anxiety 11/2013    "little bit"   . COPD (chronic obstructive pulmonary disease) ([place])   . Pneumonia 11/2013  . Diverticulosis of colon 1996  . CKD (chronic kidney disease)     GFR 55 11/2014   . GAVE (gastric antral vascular ectasia) 01/16/2015  . Hx of adenomatous colonic polyps     Past Surgical History  Procedure Laterality Date  . Nissen fundoplication      this ultimately failed and she underwent gastropexy  . Cholecystectomy    . Bladder suspension      .  bladder tack x 3.   . Abdominal hysterectomy    . Cataract extraction w/ intraocular lens  implant, bilateral Bilateral   . Appendectomy    . Hip arthroplasty Left 02/07/2014    Procedure: LEFT HIP PRESS FIT MONOPOLAR HEMIARTHROPLASTY ;  Surgeon: Marybelle Killings, MD;  Location: [facility];  Service: Orthopedics;  Laterality: Left;  . Esophagogastroduodenoscopy N/A 09/01/2014    Procedure: ESOPHAGOGASTRODUODENOSCOPY (EGD);  Surgeon: Lafayette Dragon, MD;  Location: [facility];  Service: Endoscopy;  Laterality: N/A;  . Gastropexy  1989  . Enterocele repair  2003    with colopexy.   . Colonoscopy N/A 01/16/2015    Procedure: COLONOSCOPY;  Surgeon: Gatha Mayer, MD;  Location: [facility]COPY;  Service: Endoscopy;  Laterality: N/A;  . Esophagogastroduodenoscopy N/A 01/16/2015    Procedure: ESOPHAGOGASTRODUODENOSCOPY (EGD);  Surgeon: Gatha Mayer, MD;  Location: [facility] ENDOSCOPY;  Service: Endoscopy;  Laterality: N/A;  . Anterior approach hemi hip arthroplasty Right 01/15/2016    Procedure:  ANTERIOR APPROACH HEMI HIP ARTHROPLASTY;  Surgeon: Mcarthur Rossetti, MD;  Location: [facility];  Service: Orthopedics;  Laterality: Right;      reports that she has quit smoking. Her smoking use included Cigarettes. She has a 10 pack-year smoking history. She has never used smokeless tobacco. She reports that she does not drink alcohol or use illicit drugs. Social History   Social History  . Marital Status: Widowed    Spouse Name: N/A  . Number of Children: 3  . Years of Education: 8th   Occupational History  . Retired [employer] Topics  . Smoking status: Former Smoker -- 0.25 packs/day for 40 years    Types: Cigarettes  . Smokeless tobacco: Never Used     Comment: "quit smoking ~ 2005" but was on/off during that time  . Alcohol Use: No     Comment: 11/26/2013 "drank a little alcohol; last drink was a long time ago"  . Drug Use: No  . Sexual Activity: No   Other Topics Concern  . Not on file   Social History Narrative   Lives at home by herself with close support   Used to be a weaver but retired in [place]:    Allergies  Allergen Reactions  . Penicillins Hives    Pertinent  Health Maintenance Due  Topic Date Due  . DEXA SCAN  07/02/1998  . PNA vac Low Risk Adult (2 of 2 - PCV13) 11/28/2014  . INFLUENZA VACCINE  02/23/2016    Medications:   Medication List       This list is accurate as of: 02/02/16 12:12 PM.  Always use your most recent med list.               alendronate 70 MG tablet  Commonly known as:  FOSAMAX  Take 1 tablet (70 mg total) by mouth once a week. Take with a full glass of water on an empty stomach.     CALTRATE 600+D 600-400 MG-UNIT tablet  Generic drug:  Calcium Carbonate-Vitamin D  Take 1 tablet by mouth daily.     conjugated estrogens vaginal cream  Commonly known as:  PREMARIN  Place 1 Applicatorful vaginally daily.     Fish Oil 1000 MG Caps  Take 100 mg by mouth daily.      gabapentin 300 MG capsule  Commonly known as:  NEURONTIN  Take 600 mg by mouth at bedtime.     HYDROcodone-acetaminophen 5-325 MG tablet  Commonly known as:  NORCO/VICODIN  Take 1-2 tablets by mouth every 6 (six) hours as needed for moderate pain.     levothyroxine 88 MCG tablet  Commonly known as:  SYNTHROID, LEVOTHROID  Take 88 mcg by mouth daily before breakfast. For hypothyroidism     MAGNESIUM PO  Take 200 mg by mouth daily.     metoprolol succinate 50 MG 24 hr tablet  Commonly known as:  TOPROL-XL  Take 50 mg by mouth daily. Take with or immediately following a meal.     omeprazole 20 MG capsule  Commonly known as:  PRILOSEC  Take 20 mg by mouth 2 (two) times daily.     ondansetron 4 MG tablet  Commonly known as:  ZOFRAN  Take 1 tablet (4 mg total) by mouth every 6 (six) hours as needed for nausea.     oxybutynin 5 MG 24 hr tablet  Commonly known as:  DITROPAN-XL  Take 1 tablet (5 mg total) by mouth at bedtime.     polyethylene glycol packet  Commonly known as:  MIRALAX / GLYCOLAX  Take 17 g by mouth 2 (two) times daily.     rivaroxaban 10 MG Tabs tablet  Commonly known as:  XARELTO  Take 1 tablet (10 mg total) by mouth daily. Until July 29th     senna-docusate 8.6-50 MG tablet  Commonly known as:  Senokot-S  Take 2 tablets by mouth at bedtime.     simvastatin 10 MG tablet  Commonly known as:  ZOCOR  Take 10 mg by mouth daily at 6 PM.     Vitamin D3 400 units tablet  Take 2 tablets (800 Units total) by mouth daily.        Review of Systems  Constitutional: Negative for fever, chills, activity change, appetite change and fatigue.  HENT: Negative for congestion, rhinorrhea, sinus pressure, sneezing and sore throat.   Eyes: Negative.   Respiratory: Negative for cough, chest tightness, shortness of breath and wheezing.   Cardiovascular: Negative for chest pain, palpitations and leg swelling.  Gastrointestinal: Negative for nausea, vomiting, abdominal pain,  diarrhea, constipation and abdominal distention.  Endocrine: Negative.   Genitourinary: Negative for dysuria, urgency and frequency.  Musculoskeletal: Positive for gait problem.       Right hip pain   Skin: Negative for color change, pallor and rash.       Right hip surgical incision   Neurological: Negative for dizziness, seizures, syncope, light-headedness and headaches.  Psychiatric/Behavioral: Negative for hallucinations, confusion and agitation. The patient is not nervous/anxious.     Filed Vitals:   02/02/16 0940  BP: 150/60  Pulse: 60  Temp: 98.4 F (36.9 C)  Resp: 20  Height: 5\' 8"  (1.727 m)  Weight: 161 lb 9.6 oz (73.301 kg)  SpO2: 96%   Body mass index is 24.58 kg/(m^2). Physical Exam  Constitutional: She is oriented to person, place, and time. She appears well-developed and well-nourished. No distress.  Elderly   HENT:  Head: Normocephalic.  Mouth/Throat: Oropharynx is clear and moist. No oropharyngeal exudate.  Eyes: Conjunctivae and EOM are normal. Pupils are equal, round, and reactive to light. Right eye exhibits no discharge. Left eye exhibits no discharge. No scleral icterus.  Neck: Normal range of motion. No JVD present. No thyromegaly present.  Cardiovascular: Normal rate, regular rhythm, normal heart sounds and intact distal pulses.  Exam reveals no gallop and no friction rub.   No murmur heard. Pulmonary/Chest: Effort normal and breath sounds normal. No respiratory distress. She has no wheezes. She has no rales.  Abdominal: Soft. Bowel sounds are normal. She exhibits no distension. There is no tenderness. There is no rebound and no guarding.  Genitourinary:  Overactive bladder   Musculoskeletal: She exhibits no edema or tenderness.  Moves X 4 extremities limited ROM to right hip   Lymphadenopathy:    She has no cervical adenopathy.  Neurological: She is oriented to person, place, and time.  Skin: Skin is warm and dry. No rash noted. No erythema. No  pallor.  Right hip surgical drsg dry, clean and intact old mark drainage stain noted. Surrounding skin tissue without any redness, swelling or tender to touch.   Psychiatric: She has a normal mood and affect.    Labs reviewed: Basic Metabolic Panel:  Recent Labs  01/15/16 1450 01/16/16 0311 01/17/16 0833 01/25/16  NA 131* 132* 132* 1*  K 4.8 4.0 4.3 4.4  CL 98* 98* 97*  --   CO2 26 26 27   --   GLUCOSE 101* 119* 105*  --   BUN 13 10 11 13   CREATININE 1.06* 0.89 0.95 0.6  CALCIUM 10.0 8.9 8.9  --    Liver Function Tests:  Recent Labs  01/15/16 1450 01/25/16  AST 29 28  ALT 14 20  ALKPHOS 67 71  BILITOT 0.5  --   PROT 7.2  --   ALBUMIN 3.6  --    No results for input(s): LIPASE, AMYLASE in the last 8760 hours. No results for input(s): AMMONIA in the last 8760 hours. CBC:  Recent Labs  01/15/16 1450  01/16/16 0311 01/17/16 0833 01/18/16 0543 01/25/16  WBC 5.3  < > 10.9* 9.2 9.7 5.2  NEUTROABS 3.7  --   --   --   --   --   HGB 13.8  < > 12.7 13.1 11.0* 10.4*  HCT 41.6  < > 38.9 40.6 35.1* 32*  MCV 95.4  < > 95.6 96.9 97.0  --   PLT 193  < > 176 125* 148* 364  < > = values in this interval not displayed. Cardiac Enzymes: No results for input(s): CKTOTAL, CKMB, CKMBINDEX, TROPONINI in the last 8760 hours. BNP: Invalid input(s): POCBNP CBG:  Recent Labs  01/16/16 0635 01/17/16 0715 01/18/16 0625  GLUCAP 118* 88 104*    Procedures and Imaging Studies During Stay: Dg Chest 1 View  01/15/2016  CLINICAL DATA:  Patient fell today.  Hip pain. EXAM: CHEST 1 VIEW COMPARISON:  07/14/2015 FINDINGS: Numerous naps overlie the chest. Lungs are hyperexpanded. Atelectasis or scarring noted left base. Interstitial markings are diffusely coarsened with chronic features. The cardiopericardial silhouette is within normal limits for size. Small hiatal hernia noted. Bones are diffusely demineralized. IMPRESSION: Emphysema with atelectasis or scarring at the left base. Small  hiatal hernia. Electronically Signed   By: Misty Stanley M.D.   On: 01/15/2016 16:17   Dg Shoulder Right  01/16/2016  CLINICAL DATA:  82 year old female with a history of fall EXAM: RIGHT SHOULDER - 2+ VIEW COMPARISON:  None. FINDINGS: No acute fracture.  Glenohumeral joint appears congruent. Degenerative changes of the acromioclavicular joint and glenohumeral joint. Osteopenia. IMPRESSION: No acute bony abnormality. Signed, Dulcy Fanny. Earleen Newport, DO [facility] Electronically Signed   By: Corrie Mckusick D.O.   On: 01/16/2016 18:26   Pelvis Portable  01/15/2016  CLINICAL DATA:  Status post right hip surgery.  Initial encounter. EXAM: PORTABLE PELVIS 1-2 VIEWS COMPARISON:  Pelvis radiograph performed 02/07/2014 FINDINGS: The patient is status post right hip arthroplasty. The arthroplasty is noted in expected position, without evidence of loosening or new fracture. Overlying skin staples and scattered soft tissue air are seen. The patient's left hip arthroplasty is unremarkable in appearance. The visualized bowel gas pattern is grossly unremarkable in appearance. IMPRESSION: Status post right hip arthroplasty.  No evidence of new fracture. Electronically Signed   By: Francoise Schaumann.D.  On: 01/15/2016 22:52   Dg Hip Operative Unilat W Or W/o Pelvis Right  01/16/2016  CLINICAL DATA:  Right hip hemiarthroplasty.  Initial encounter. EXAM: OPERATIVE RIGHT HIP (WITH PELVIS IF PERFORMED) 1 VIEW TECHNIQUE: Fluoroscopic spot image(s) were submitted for interpretation post-operatively. COMPARISON:  Right hip radiographs performed earlier today at 3:29 p.m. FINDINGS: Five fluoroscopic C-arm images are provided from the OR, demonstrating placement of a right hip hemiarthroplasty. Overlying postoperative soft tissue air is noted. No new fractures are seen. The visualized portions of the patient's left hip hemiarthroplasty is grossly unremarkable. IMPRESSION: Status  post placement of right hip hemiarthroplasty. No new fracture seen. Electronically Signed   By: Garald Balding M.D.   On: 01/16/2016 00:56   Dg Hip Unilat With Pelvis 2-3 Views Right  01/15/2016  CLINICAL DATA:  RIGHT hip pain after falling today, initial encounter EXAM: DG HIP (WITH OR WITHOUT PELVIS) 2-3V RIGHT COMPARISON:  None FINDINGS: Marked osseous demineralization. LEFT hip prosthesis. SI joints preserved. Displaced subcapital RIGHT femoral neck fracture. No dislocation. Pelvis appears intact. Scattered atherosclerotic calcifications aorta and iliac arteries. IMPRESSION: Osseous demineralization with displaced subcapital fracture RIGHT femoral neck. Prior LEFT hip replacement. Aortic atherosclerosis. Electronically Signed   By: Lavonia Dana M.D.   On: 01/15/2016 16:19    Assessment/Plan:   HTN B/p stable. Continue on Metoprolol succ 50 mg Tablet. BMP in 1-2 weeks with PCP   Hypothyroidism  Continue on Levothyroxine 88 mcg Tablet monitor TSH level   Hyperlipidemia Continue on Simvastatin 10 mg Tablet. Lipid panel with PCP   Fracture of neck of Right Femur  Afebrile.Status post short term rehabilitation post hospital admission from 01/15/16-01/18/16 with right femoral neck fracture. She underwent surgical repair. Surgical Incision without any signs of infections.  She has worked well with PT/OT will discharge home to continue with PT/OT. Continue Xarelto for DVT prophylaxis until 02/20/2016.follow up with Ortho as directed.    Neuropathic Pain  Continue on Gabapentin   Constipation Current regimen effective.   GERD Continue on Omeprazole 20 mg Capsule.  OAB  Continue on Oxybutynin   Osteoporosis  Continue on Alndronate 70 mg Tablet weekly, Calcium /Vit D  Abnormal Gait   S/p right hip femur fracture repair. Has worked well with PT/ OT. Will discharge home PT/OT to continue with ROM, Exercise, Gait stability and muscle strengthening. Has own rolling walker. Fall and safety  precautions.   Patient is being discharged with the following home health services:     PT/OT to continue with ROM, Exercise, Gait stability and muscle strengthening.   Patient is being discharged with the following durable medical equipment:   None has own walker.  Prescription medication  written x 1 month supply.    Patient has been advised to f/u with their PCP in 1-2 weeks to bring them up to date on their rehab stay.  Social services at facility was responsible for arranging this appointment.  Pt was provided with a 30 day supply of prescriptions for medications and refills must be obtained from their PCP.  For controlled substances, a more limited supply may be provided adequate until PCP appointment only.  Future labs/tests needed:  CBC, BMP in 1-2 weeks with PCP

## 2016-02-03 DIAGNOSIS — E785 Hyperlipidemia, unspecified: Secondary | ICD-10-CM | POA: Diagnosis not present

## 2016-02-03 DIAGNOSIS — Z792 Long term (current) use of antibiotics: Secondary | ICD-10-CM | POA: Diagnosis not present

## 2016-02-03 DIAGNOSIS — M810 Age-related osteoporosis without current pathological fracture: Secondary | ICD-10-CM | POA: Diagnosis not present

## 2016-02-03 DIAGNOSIS — Z9181 History of falling: Secondary | ICD-10-CM | POA: Diagnosis not present

## 2016-02-03 DIAGNOSIS — S72011D Unspecified intracapsular fracture of right femur, subsequent encounter for closed fracture with routine healing: Secondary | ICD-10-CM | POA: Diagnosis not present

## 2016-02-03 DIAGNOSIS — K2270 Barrett's esophagus without dysplasia: Secondary | ICD-10-CM | POA: Diagnosis not present

## 2016-02-03 DIAGNOSIS — I10 Essential (primary) hypertension: Secondary | ICD-10-CM | POA: Diagnosis not present

## 2016-02-03 DIAGNOSIS — E039 Hypothyroidism, unspecified: Secondary | ICD-10-CM | POA: Diagnosis not present

## 2016-02-03 DIAGNOSIS — Z7901 Long term (current) use of anticoagulants: Secondary | ICD-10-CM | POA: Diagnosis not present

## 2016-02-03 DIAGNOSIS — S72041D Displaced fracture of base of neck of right femur, subsequent encounter for closed fracture with routine healing: Secondary | ICD-10-CM | POA: Diagnosis not present

## 2016-02-03 DIAGNOSIS — Z86718 Personal history of other venous thrombosis and embolism: Secondary | ICD-10-CM | POA: Diagnosis not present

## 2016-02-03 DIAGNOSIS — Z96643 Presence of artificial hip joint, bilateral: Secondary | ICD-10-CM | POA: Diagnosis not present

## 2016-02-04 DIAGNOSIS — E039 Hypothyroidism, unspecified: Secondary | ICD-10-CM | POA: Diagnosis not present

## 2016-02-04 DIAGNOSIS — Z96643 Presence of artificial hip joint, bilateral: Secondary | ICD-10-CM | POA: Diagnosis not present

## 2016-02-04 DIAGNOSIS — S72011D Unspecified intracapsular fracture of right femur, subsequent encounter for closed fracture with routine healing: Secondary | ICD-10-CM | POA: Diagnosis not present

## 2016-02-04 DIAGNOSIS — Z86718 Personal history of other venous thrombosis and embolism: Secondary | ICD-10-CM | POA: Diagnosis not present

## 2016-02-04 DIAGNOSIS — E785 Hyperlipidemia, unspecified: Secondary | ICD-10-CM | POA: Diagnosis not present

## 2016-02-04 DIAGNOSIS — Z7901 Long term (current) use of anticoagulants: Secondary | ICD-10-CM | POA: Diagnosis not present

## 2016-02-04 DIAGNOSIS — I10 Essential (primary) hypertension: Secondary | ICD-10-CM | POA: Diagnosis not present

## 2016-02-04 DIAGNOSIS — M810 Age-related osteoporosis without current pathological fracture: Secondary | ICD-10-CM | POA: Diagnosis not present

## 2016-02-04 DIAGNOSIS — Z792 Long term (current) use of antibiotics: Secondary | ICD-10-CM | POA: Diagnosis not present

## 2016-02-04 DIAGNOSIS — K2270 Barrett's esophagus without dysplasia: Secondary | ICD-10-CM | POA: Diagnosis not present

## 2016-02-04 DIAGNOSIS — Z9181 History of falling: Secondary | ICD-10-CM | POA: Diagnosis not present

## 2016-02-05 DIAGNOSIS — Z96643 Presence of artificial hip joint, bilateral: Secondary | ICD-10-CM | POA: Diagnosis not present

## 2016-02-05 DIAGNOSIS — Z9181 History of falling: Secondary | ICD-10-CM | POA: Diagnosis not present

## 2016-02-05 DIAGNOSIS — M810 Age-related osteoporosis without current pathological fracture: Secondary | ICD-10-CM | POA: Diagnosis not present

## 2016-02-05 DIAGNOSIS — I10 Essential (primary) hypertension: Secondary | ICD-10-CM | POA: Diagnosis not present

## 2016-02-05 DIAGNOSIS — E039 Hypothyroidism, unspecified: Secondary | ICD-10-CM | POA: Diagnosis not present

## 2016-02-05 DIAGNOSIS — K2270 Barrett's esophagus without dysplasia: Secondary | ICD-10-CM | POA: Diagnosis not present

## 2016-02-05 DIAGNOSIS — Z86718 Personal history of other venous thrombosis and embolism: Secondary | ICD-10-CM | POA: Diagnosis not present

## 2016-02-05 DIAGNOSIS — Z792 Long term (current) use of antibiotics: Secondary | ICD-10-CM | POA: Diagnosis not present

## 2016-02-05 DIAGNOSIS — S72011D Unspecified intracapsular fracture of right femur, subsequent encounter for closed fracture with routine healing: Secondary | ICD-10-CM | POA: Diagnosis not present

## 2016-02-05 DIAGNOSIS — Z7901 Long term (current) use of anticoagulants: Secondary | ICD-10-CM | POA: Diagnosis not present

## 2016-02-05 DIAGNOSIS — E785 Hyperlipidemia, unspecified: Secondary | ICD-10-CM | POA: Diagnosis not present

## 2016-02-08 ENCOUNTER — Ambulatory Visit (INDEPENDENT_AMBULATORY_CARE_PROVIDER_SITE_OTHER): Payer: Medicare Other | Admitting: Internal Medicine

## 2016-02-08 ENCOUNTER — Encounter: Payer: Self-pay | Admitting: Internal Medicine

## 2016-02-08 ENCOUNTER — Ambulatory Visit: Payer: Medicare Other

## 2016-02-08 ENCOUNTER — Telehealth: Payer: Self-pay | Admitting: Internal Medicine

## 2016-02-08 VITALS — BP 138/68 | HR 44 | Temp 97.5°F | Ht 69.0 in | Wt 156.9 lb

## 2016-02-08 DIAGNOSIS — R001 Bradycardia, unspecified: Secondary | ICD-10-CM

## 2016-02-08 DIAGNOSIS — I10 Essential (primary) hypertension: Secondary | ICD-10-CM

## 2016-02-08 DIAGNOSIS — S72001D Fracture of unspecified part of neck of right femur, subsequent encounter for closed fracture with routine healing: Secondary | ICD-10-CM

## 2016-02-08 DIAGNOSIS — W19XXXD Unspecified fall, subsequent encounter: Secondary | ICD-10-CM

## 2016-02-08 DIAGNOSIS — Z7901 Long term (current) use of anticoagulants: Secondary | ICD-10-CM

## 2016-02-08 DIAGNOSIS — Z79899 Other long term (current) drug therapy: Secondary | ICD-10-CM

## 2016-02-08 DIAGNOSIS — Z9181 History of falling: Secondary | ICD-10-CM

## 2016-02-08 DIAGNOSIS — Z87891 Personal history of nicotine dependence: Secondary | ICD-10-CM

## 2016-02-08 DIAGNOSIS — S72001S Fracture of unspecified part of neck of right femur, sequela: Secondary | ICD-10-CM

## 2016-02-08 DIAGNOSIS — N390 Urinary tract infection, site not specified: Secondary | ICD-10-CM

## 2016-02-08 MED ORDER — SIMVASTATIN 10 MG PO TABS: 10.0000 mg | ORAL_TABLET | Freq: Every day | ORAL | Status: DC

## 2016-02-08 MED ORDER — ALENDRONATE SODIUM 70 MG PO TABS
70.0000 mg | ORAL_TABLET | ORAL | Status: DC
Start: 2016-02-08 — End: 2016-08-16

## 2016-02-08 MED ORDER — ESTROGENS, CONJUGATED 0.625 MG/GM VA CREA: 1.0000 | TOPICAL_CREAM | Freq: Every day | VAGINAL | Status: DC

## 2016-02-08 MED ORDER — OXYBUTYNIN CHLORIDE ER 5 MG PO TB24: 5.0000 mg | ORAL_TABLET | Freq: Every day | ORAL | Status: DC

## 2016-02-08 MED ORDER — LEVOTHYROXINE SODIUM 88 MCG PO TABS: 88.0000 ug | ORAL_TABLET | Freq: Every day | ORAL | Status: DC

## 2016-02-08 MED ORDER — GABAPENTIN 300 MG PO CAPS: 600.0000 mg | ORAL_CAPSULE | Freq: Every day | ORAL | Status: DC

## 2016-02-08 NOTE — Progress Notes (Signed)
Patient ID: Carolyn [name], female   DOB: 21-Jun-1931, 82 y.o.   MRN: [id number]    CC: hospital follow up for right femoral neck fracture and gait instability HPI: [name] is a 82 y.o. woman with PMH noted below who is here for hospital follow up after a fall resulting in femoral neck fracture  Please see Problem List/A&P for the status of the patient's chronic medical problems   Past Medical History  Diagnosis Date  . Hypertension   . High cholesterol   . Hypothyroidism   . Asthma     "only when I smoked"  . Hiatal hernia     s/p gastropexy after failed Nissen fundoplication.   . Barrett's esophagus since at least 1985  . Arthritis 11/2013.     "both knees" (11/26/2013)  . Depression with anxiety 11/2013    "little bit"   . COPD (chronic obstructive pulmonary disease) ([place])   . Pneumonia 11/2013  . Diverticulosis of colon 1996  . CKD (chronic kidney disease)     GFR 55 11/2014   . GAVE (gastric antral vascular ectasia) 01/16/2015  . Hx of adenomatous colonic polyps     Review of Systems:  Negative except as per HPI No new falls, feels more strength  Physical Exam: Filed Vitals:   02/08/16 1052  BP: 138/68  Pulse: 44  Temp: 97.5 F (36.4 C)  TempSrc: Oral  Height: 5\' 9"  (1.753 m)  Weight: 156 lb 14.4 oz (71.169 kg)  SpO2: 99%    General: A&O, in NAD CV: RRR, normal s1, s2, no m/r/g Resp: equal and symmetric breath sounds, no wheezing or crackles  Abdomen: soft, nontender, nondistended, +BS Skin: warm, dry, intact,  Extremities: trace pitting edema, has compression stockings on    Assessment & Plan:   See encounters tab for problem based medical decision making. Patient discussed with Dr. Angelia Mould

## 2016-02-08 NOTE — Assessment & Plan Note (Addendum)
Her blood pressure is at goal today at 138/68 with HR of 44.  Plan We decreased the metoprolol to 25 mg daily as her HR was 44., and pt will follow up on Aug 1st to re-assess her BP and HR

## 2016-02-08 NOTE — Patient Instructions (Signed)
Thank you for your visit today Please continue to work with physical therapy Please continue to take your blood thinner (xarelto) until July 29th. Please take the fosamax weekly for your bone. Please follow up with Dr Dareen Piano on August 1st  It was a pleasure seeing you again !

## 2016-02-08 NOTE — Progress Notes (Signed)
Internal Medicine Clinic Attending  Case discussed with Dr. Tiburcio Pea soon after the resident saw the patient.  We reviewed the resident's history and exam and pertinent patient test results.  I agree with the assessment, diagnosis, and plan of care documented in the resident's note. Given her recent fall and her bradycardia today I am concerned about the metoprolol for which she takes for Hypertension.  I am concerned this could lead to further falls even if she is asymptomatic today.  Dr Tiburcio Pea will call the patient and instruct her to decrease the metoprolol.  She does have a close follow up with her PCP.

## 2016-02-08 NOTE — Telephone Encounter (Signed)
I called the pt at 3 PM- asking her to decrease the metoprolol to 25 mg (cut the pill in half) She indicated understanding of that and repeated back the info to me.

## 2016-02-08 NOTE — Assessment & Plan Note (Addendum)
Pt followed up with Dr Ninfa Linden last Thursday, and staples were removed. She finished her rehab and now is at home. She is getting her home physical therapy twice a week. She says it seems to be helping her as she has regained her gait stability and does not feel off balance and yesterday was able to walk with PT to her yard and back.  She denies any pain overall, only when she does the PT, and for that she usually takes one tylenol arthritis. She continues to take the xarelto until July 29th and wears compression stockings.   Per recorded vitals, She has lost about 15 pounds from March to today, and she says she has very little appetite. She denies any nausea or vomiting, or diarrhea. She takes 1 ensure a day to supplement.  I asked her to consume what she likes to eat, increase the portion size,  and try to take 2 of ensure a day. Pt will follow up with Dr Ninfa Linden in 4 weeks, and will follow up on Aug 1st here. We will also monitor her weight at the next visit.   Of note, her hgB was noted to be 10.4 on 7/3, likely post-op. She denies any bleeding. However given that she is on Xarelto, we will repeat CBC at the next visit

## 2016-02-08 NOTE — Telephone Encounter (Signed)
Costella Hatcher, OT for [facility] requesting VO for Pt.  Please call back.

## 2016-02-09 DIAGNOSIS — K2270 Barrett's esophagus without dysplasia: Secondary | ICD-10-CM | POA: Diagnosis not present

## 2016-02-09 DIAGNOSIS — M810 Age-related osteoporosis without current pathological fracture: Secondary | ICD-10-CM | POA: Diagnosis not present

## 2016-02-09 DIAGNOSIS — Z9181 History of falling: Secondary | ICD-10-CM | POA: Diagnosis not present

## 2016-02-09 DIAGNOSIS — E039 Hypothyroidism, unspecified: Secondary | ICD-10-CM | POA: Diagnosis not present

## 2016-02-09 DIAGNOSIS — S72011D Unspecified intracapsular fracture of right femur, subsequent encounter for closed fracture with routine healing: Secondary | ICD-10-CM | POA: Diagnosis not present

## 2016-02-09 DIAGNOSIS — Z96643 Presence of artificial hip joint, bilateral: Secondary | ICD-10-CM | POA: Diagnosis not present

## 2016-02-09 DIAGNOSIS — E785 Hyperlipidemia, unspecified: Secondary | ICD-10-CM | POA: Diagnosis not present

## 2016-02-09 DIAGNOSIS — Z7901 Long term (current) use of anticoagulants: Secondary | ICD-10-CM | POA: Diagnosis not present

## 2016-02-09 DIAGNOSIS — Z86718 Personal history of other venous thrombosis and embolism: Secondary | ICD-10-CM | POA: Diagnosis not present

## 2016-02-09 DIAGNOSIS — Z792 Long term (current) use of antibiotics: Secondary | ICD-10-CM | POA: Diagnosis not present

## 2016-02-09 DIAGNOSIS — I10 Essential (primary) hypertension: Secondary | ICD-10-CM | POA: Diagnosis not present

## 2016-02-09 NOTE — Telephone Encounter (Signed)
Lm for jim to call back

## 2016-02-10 DIAGNOSIS — Z86718 Personal history of other venous thrombosis and embolism: Secondary | ICD-10-CM | POA: Diagnosis not present

## 2016-02-10 DIAGNOSIS — K2270 Barrett's esophagus without dysplasia: Secondary | ICD-10-CM | POA: Diagnosis not present

## 2016-02-10 DIAGNOSIS — M810 Age-related osteoporosis without current pathological fracture: Secondary | ICD-10-CM | POA: Diagnosis not present

## 2016-02-10 DIAGNOSIS — Z7901 Long term (current) use of anticoagulants: Secondary | ICD-10-CM | POA: Diagnosis not present

## 2016-02-10 DIAGNOSIS — S72011D Unspecified intracapsular fracture of right femur, subsequent encounter for closed fracture with routine healing: Secondary | ICD-10-CM | POA: Diagnosis not present

## 2016-02-10 DIAGNOSIS — Z9181 History of falling: Secondary | ICD-10-CM | POA: Diagnosis not present

## 2016-02-10 DIAGNOSIS — I10 Essential (primary) hypertension: Secondary | ICD-10-CM | POA: Diagnosis not present

## 2016-02-10 DIAGNOSIS — Z96643 Presence of artificial hip joint, bilateral: Secondary | ICD-10-CM | POA: Diagnosis not present

## 2016-02-10 DIAGNOSIS — E785 Hyperlipidemia, unspecified: Secondary | ICD-10-CM | POA: Diagnosis not present

## 2016-02-10 DIAGNOSIS — Z792 Long term (current) use of antibiotics: Secondary | ICD-10-CM | POA: Diagnosis not present

## 2016-02-10 DIAGNOSIS — E039 Hypothyroidism, unspecified: Secondary | ICD-10-CM | POA: Diagnosis not present

## 2016-02-11 DIAGNOSIS — S72011D Unspecified intracapsular fracture of right femur, subsequent encounter for closed fracture with routine healing: Secondary | ICD-10-CM | POA: Diagnosis not present

## 2016-02-11 DIAGNOSIS — K2270 Barrett's esophagus without dysplasia: Secondary | ICD-10-CM | POA: Diagnosis not present

## 2016-02-11 DIAGNOSIS — Z792 Long term (current) use of antibiotics: Secondary | ICD-10-CM | POA: Diagnosis not present

## 2016-02-11 DIAGNOSIS — I10 Essential (primary) hypertension: Secondary | ICD-10-CM | POA: Diagnosis not present

## 2016-02-11 DIAGNOSIS — Z9181 History of falling: Secondary | ICD-10-CM | POA: Diagnosis not present

## 2016-02-11 DIAGNOSIS — E785 Hyperlipidemia, unspecified: Secondary | ICD-10-CM | POA: Diagnosis not present

## 2016-02-11 DIAGNOSIS — Z86718 Personal history of other venous thrombosis and embolism: Secondary | ICD-10-CM | POA: Diagnosis not present

## 2016-02-11 DIAGNOSIS — Z96643 Presence of artificial hip joint, bilateral: Secondary | ICD-10-CM | POA: Diagnosis not present

## 2016-02-11 DIAGNOSIS — E039 Hypothyroidism, unspecified: Secondary | ICD-10-CM | POA: Diagnosis not present

## 2016-02-11 DIAGNOSIS — M810 Age-related osteoporosis without current pathological fracture: Secondary | ICD-10-CM | POA: Diagnosis not present

## 2016-02-11 DIAGNOSIS — Z7901 Long term (current) use of anticoagulants: Secondary | ICD-10-CM | POA: Diagnosis not present

## 2016-02-15 DIAGNOSIS — Z96643 Presence of artificial hip joint, bilateral: Secondary | ICD-10-CM | POA: Diagnosis not present

## 2016-02-15 DIAGNOSIS — Z792 Long term (current) use of antibiotics: Secondary | ICD-10-CM | POA: Diagnosis not present

## 2016-02-15 DIAGNOSIS — M810 Age-related osteoporosis without current pathological fracture: Secondary | ICD-10-CM | POA: Diagnosis not present

## 2016-02-15 DIAGNOSIS — K2270 Barrett's esophagus without dysplasia: Secondary | ICD-10-CM | POA: Diagnosis not present

## 2016-02-15 DIAGNOSIS — I10 Essential (primary) hypertension: Secondary | ICD-10-CM | POA: Diagnosis not present

## 2016-02-15 DIAGNOSIS — Z7901 Long term (current) use of anticoagulants: Secondary | ICD-10-CM | POA: Diagnosis not present

## 2016-02-15 DIAGNOSIS — E785 Hyperlipidemia, unspecified: Secondary | ICD-10-CM | POA: Diagnosis not present

## 2016-02-15 DIAGNOSIS — E039 Hypothyroidism, unspecified: Secondary | ICD-10-CM | POA: Diagnosis not present

## 2016-02-15 DIAGNOSIS — Z9181 History of falling: Secondary | ICD-10-CM | POA: Diagnosis not present

## 2016-02-15 DIAGNOSIS — S72011D Unspecified intracapsular fracture of right femur, subsequent encounter for closed fracture with routine healing: Secondary | ICD-10-CM | POA: Diagnosis not present

## 2016-02-15 DIAGNOSIS — Z86718 Personal history of other venous thrombosis and embolism: Secondary | ICD-10-CM | POA: Diagnosis not present

## 2016-02-16 DIAGNOSIS — Z792 Long term (current) use of antibiotics: Secondary | ICD-10-CM | POA: Diagnosis not present

## 2016-02-16 DIAGNOSIS — S72011D Unspecified intracapsular fracture of right femur, subsequent encounter for closed fracture with routine healing: Secondary | ICD-10-CM | POA: Diagnosis not present

## 2016-02-16 DIAGNOSIS — E039 Hypothyroidism, unspecified: Secondary | ICD-10-CM | POA: Diagnosis not present

## 2016-02-16 DIAGNOSIS — Z9181 History of falling: Secondary | ICD-10-CM | POA: Diagnosis not present

## 2016-02-16 DIAGNOSIS — Z86718 Personal history of other venous thrombosis and embolism: Secondary | ICD-10-CM | POA: Diagnosis not present

## 2016-02-16 DIAGNOSIS — I10 Essential (primary) hypertension: Secondary | ICD-10-CM | POA: Diagnosis not present

## 2016-02-16 DIAGNOSIS — K2270 Barrett's esophagus without dysplasia: Secondary | ICD-10-CM | POA: Diagnosis not present

## 2016-02-16 DIAGNOSIS — M810 Age-related osteoporosis without current pathological fracture: Secondary | ICD-10-CM | POA: Diagnosis not present

## 2016-02-16 DIAGNOSIS — E785 Hyperlipidemia, unspecified: Secondary | ICD-10-CM | POA: Diagnosis not present

## 2016-02-16 DIAGNOSIS — Z96643 Presence of artificial hip joint, bilateral: Secondary | ICD-10-CM | POA: Diagnosis not present

## 2016-02-16 DIAGNOSIS — Z7901 Long term (current) use of anticoagulants: Secondary | ICD-10-CM | POA: Diagnosis not present

## 2016-02-17 DIAGNOSIS — Z7901 Long term (current) use of anticoagulants: Secondary | ICD-10-CM | POA: Diagnosis not present

## 2016-02-17 DIAGNOSIS — S72011D Unspecified intracapsular fracture of right femur, subsequent encounter for closed fracture with routine healing: Secondary | ICD-10-CM | POA: Diagnosis not present

## 2016-02-17 DIAGNOSIS — Z792 Long term (current) use of antibiotics: Secondary | ICD-10-CM | POA: Diagnosis not present

## 2016-02-17 DIAGNOSIS — Z9181 History of falling: Secondary | ICD-10-CM | POA: Diagnosis not present

## 2016-02-17 DIAGNOSIS — Z96643 Presence of artificial hip joint, bilateral: Secondary | ICD-10-CM | POA: Diagnosis not present

## 2016-02-17 DIAGNOSIS — E785 Hyperlipidemia, unspecified: Secondary | ICD-10-CM | POA: Diagnosis not present

## 2016-02-17 DIAGNOSIS — E039 Hypothyroidism, unspecified: Secondary | ICD-10-CM | POA: Diagnosis not present

## 2016-02-17 DIAGNOSIS — M810 Age-related osteoporosis without current pathological fracture: Secondary | ICD-10-CM | POA: Diagnosis not present

## 2016-02-17 DIAGNOSIS — Z86718 Personal history of other venous thrombosis and embolism: Secondary | ICD-10-CM | POA: Diagnosis not present

## 2016-02-17 DIAGNOSIS — K2270 Barrett's esophagus without dysplasia: Secondary | ICD-10-CM | POA: Diagnosis not present

## 2016-02-17 DIAGNOSIS — I10 Essential (primary) hypertension: Secondary | ICD-10-CM | POA: Diagnosis not present

## 2016-02-22 ENCOUNTER — Telehealth: Payer: Self-pay | Admitting: Internal Medicine

## 2016-02-22 DIAGNOSIS — Z86718 Personal history of other venous thrombosis and embolism: Secondary | ICD-10-CM | POA: Diagnosis not present

## 2016-02-22 DIAGNOSIS — Z792 Long term (current) use of antibiotics: Secondary | ICD-10-CM | POA: Diagnosis not present

## 2016-02-22 DIAGNOSIS — S72011D Unspecified intracapsular fracture of right femur, subsequent encounter for closed fracture with routine healing: Secondary | ICD-10-CM | POA: Diagnosis not present

## 2016-02-22 DIAGNOSIS — Z96643 Presence of artificial hip joint, bilateral: Secondary | ICD-10-CM | POA: Diagnosis not present

## 2016-02-22 DIAGNOSIS — Z9181 History of falling: Secondary | ICD-10-CM | POA: Diagnosis not present

## 2016-02-22 DIAGNOSIS — M810 Age-related osteoporosis without current pathological fracture: Secondary | ICD-10-CM | POA: Diagnosis not present

## 2016-02-22 DIAGNOSIS — E785 Hyperlipidemia, unspecified: Secondary | ICD-10-CM | POA: Diagnosis not present

## 2016-02-22 DIAGNOSIS — I10 Essential (primary) hypertension: Secondary | ICD-10-CM | POA: Diagnosis not present

## 2016-02-22 DIAGNOSIS — K2270 Barrett's esophagus without dysplasia: Secondary | ICD-10-CM | POA: Diagnosis not present

## 2016-02-22 DIAGNOSIS — E039 Hypothyroidism, unspecified: Secondary | ICD-10-CM | POA: Diagnosis not present

## 2016-02-22 DIAGNOSIS — Z7901 Long term (current) use of anticoagulants: Secondary | ICD-10-CM | POA: Diagnosis not present

## 2016-02-22 NOTE — Telephone Encounter (Signed)
APT. REMINDER CALL, LMTCB °

## 2016-02-23 ENCOUNTER — Encounter: Payer: Self-pay | Admitting: Internal Medicine

## 2016-02-23 ENCOUNTER — Ambulatory Visit (INDEPENDENT_AMBULATORY_CARE_PROVIDER_SITE_OTHER): Payer: Medicare Other | Admitting: Internal Medicine

## 2016-02-23 VITALS — BP 141/70 | HR 63 | Temp 97.6°F | Ht 69.0 in | Wt 157.0 lb

## 2016-02-23 DIAGNOSIS — Z23 Encounter for immunization: Secondary | ICD-10-CM | POA: Diagnosis not present

## 2016-02-23 DIAGNOSIS — S72001D Fracture of unspecified part of neck of right femur, subsequent encounter for closed fracture with routine healing: Secondary | ICD-10-CM

## 2016-02-23 DIAGNOSIS — I10 Essential (primary) hypertension: Secondary | ICD-10-CM

## 2016-02-23 DIAGNOSIS — Z9889 Other specified postprocedural states: Secondary | ICD-10-CM

## 2016-02-23 DIAGNOSIS — Z0000 Encounter for general adult medical examination without abnormal findings: Secondary | ICD-10-CM | POA: Insufficient documentation

## 2016-02-23 DIAGNOSIS — X58XXXD Exposure to other specified factors, subsequent encounter: Secondary | ICD-10-CM | POA: Diagnosis not present

## 2016-02-23 DIAGNOSIS — Z79899 Other long term (current) drug therapy: Secondary | ICD-10-CM

## 2016-02-23 DIAGNOSIS — N3281 Overactive bladder: Secondary | ICD-10-CM

## 2016-02-23 DIAGNOSIS — D649 Anemia, unspecified: Secondary | ICD-10-CM

## 2016-02-23 DIAGNOSIS — S72001S Fracture of unspecified part of neck of right femur, sequela: Secondary | ICD-10-CM

## 2016-02-23 NOTE — Assessment & Plan Note (Signed)
-   Patient is much improved on ditropan - She does complain of dry mouth but this is mild - Wil c/w ditropan for now

## 2016-02-23 NOTE — Assessment & Plan Note (Signed)
-   Patient noted to be mildly anemic post op - Likely secondary to acute blood loss from surgery - Will recheck CBC today

## 2016-02-23 NOTE — Assessment & Plan Note (Signed)
-   Patient is due for tetanus booster - Will give Tdap today

## 2016-02-23 NOTE — Assessment & Plan Note (Signed)
-   Patient is much improved and able to ambulate - She denies pain and is now off vicodin - She also completed her course of xarelto for DVT proph post op - She will follow up with Dr. Ninfa Linden next week

## 2016-02-23 NOTE — Patient Instructions (Addendum)
-   It was a pleasure seeing you today - We will check your blood counts today to see if it is improving - Please follow up in 3 months - Your BP is good. Keep up the good work - We will give you a tetanus booster today - Please call me with any questions

## 2016-02-23 NOTE — Assessment & Plan Note (Signed)
BP Readings from Last 3 Encounters:  02/23/16 (!) 141/70  02/08/16 138/68  02/02/16 (!) 150/60    Lab Results  Component Value Date   NA 1 (A) 01/25/2016   K 4.4 01/25/2016   CREATININE 0.6 01/25/2016    Assessment: Blood pressure control:  well controlled Progress toward BP goal:   at goal Comments: compliant with meds  Plan: Medications:  continue current medications Educational resources provided:   Self management tools provided:   Other plans:

## 2016-02-23 NOTE — Progress Notes (Signed)
   Subjective:    Patient ID: Carolyn [name], female    DOB: 03-12-1931, 82 y.o.   MRN: [id number]  HPI  Patient seen and examined. She is here for routine follow up of her HTN. She had a recent fracture of her right femoral neck s/p repair. She is much improved and has a follow up with Dr. Ninfa Linden. She is ambulating well and denies any pain or other complaints.     Review of Systems  Constitutional: Negative.   HENT: Negative.   Respiratory: Negative.   Cardiovascular: Negative.   Gastrointestinal: Negative.   Musculoskeletal: Negative.   Skin: Negative.   Neurological: Negative.   Psychiatric/Behavioral: Negative.        Objective:   Physical Exam  Constitutional: She is oriented to person, place, and time. She appears well-developed and well-nourished.  HENT:  Head: Normocephalic and atraumatic.  Eyes: Right eye exhibits no discharge. Left eye exhibits no discharge.  Cardiovascular: Normal rate, regular rhythm and normal heart sounds.   Pulmonary/Chest: Effort normal and breath sounds normal. No respiratory distress. She has no wheezes.  Abdominal: Soft. Bowel sounds are normal. She exhibits no distension. There is no tenderness.  Musculoskeletal: Normal range of motion. She exhibits no edema.  Neurological: She is alert and oriented to person, place, and time.  Skin: Skin is warm. No rash noted. No erythema.  Psychiatric: She has a normal mood and affect. Her behavior is normal.          Assessment & Plan:  Please see problem based charting for assessment and plan:

## 2016-02-24 ENCOUNTER — Telehealth: Payer: Self-pay | Admitting: Internal Medicine

## 2016-02-24 LAB — CBC WITH DIFFERENTIAL/PLATELET
BASOS ABS: 0 10*3/uL (ref 0.0–0.2)
Basos: 0 %
EOS (ABSOLUTE): 0.1 10*3/uL (ref 0.0–0.4)
Eos: 1 %
Hematocrit: 37.3 % (ref 34.0–46.6)
Hemoglobin: 12.1 g/dL (ref 11.1–15.9)
IMMATURE GRANS (ABS): 0 10*3/uL (ref 0.0–0.1)
Immature Granulocytes: 0 %
LYMPHS ABS: 1.4 10*3/uL (ref 0.7–3.1)
LYMPHS: 24 %
MCH: 31.2 pg (ref 26.6–33.0)
MCHC: 32.4 g/dL (ref 31.5–35.7)
MCV: 96 fL (ref 79–97)
Monocytes Absolute: 0.7 10*3/uL (ref 0.1–0.9)
Monocytes: 12 %
Neutrophils Absolute: 3.6 10*3/uL (ref 1.4–7.0)
Neutrophils: 63 %
PLATELETS: 277 10*3/uL (ref 150–379)
RBC: 3.88 x10E6/uL (ref 3.77–5.28)
RDW: 15.6 % — ABNORMAL HIGH (ref 12.3–15.4)
WBC: 5.7 10*3/uL (ref 3.4–10.8)

## 2016-02-24 NOTE — Telephone Encounter (Signed)
Anemia has resolved on repeat CBC. Results d/w Carolyn [name].  No further w/u for now. She expresses understanding. Will f/u in 3 months

## 2016-02-25 DIAGNOSIS — Z96643 Presence of artificial hip joint, bilateral: Secondary | ICD-10-CM | POA: Diagnosis not present

## 2016-02-25 DIAGNOSIS — M810 Age-related osteoporosis without current pathological fracture: Secondary | ICD-10-CM | POA: Diagnosis not present

## 2016-02-25 DIAGNOSIS — Z792 Long term (current) use of antibiotics: Secondary | ICD-10-CM | POA: Diagnosis not present

## 2016-02-25 DIAGNOSIS — S72011D Unspecified intracapsular fracture of right femur, subsequent encounter for closed fracture with routine healing: Secondary | ICD-10-CM | POA: Diagnosis not present

## 2016-02-25 DIAGNOSIS — K2270 Barrett's esophagus without dysplasia: Secondary | ICD-10-CM | POA: Diagnosis not present

## 2016-02-25 DIAGNOSIS — Z9181 History of falling: Secondary | ICD-10-CM | POA: Diagnosis not present

## 2016-02-25 DIAGNOSIS — E785 Hyperlipidemia, unspecified: Secondary | ICD-10-CM | POA: Diagnosis not present

## 2016-02-25 DIAGNOSIS — I10 Essential (primary) hypertension: Secondary | ICD-10-CM | POA: Diagnosis not present

## 2016-02-25 DIAGNOSIS — Z7901 Long term (current) use of anticoagulants: Secondary | ICD-10-CM | POA: Diagnosis not present

## 2016-02-25 DIAGNOSIS — Z86718 Personal history of other venous thrombosis and embolism: Secondary | ICD-10-CM | POA: Diagnosis not present

## 2016-02-25 DIAGNOSIS — E039 Hypothyroidism, unspecified: Secondary | ICD-10-CM | POA: Diagnosis not present

## 2016-03-28 ENCOUNTER — Other Ambulatory Visit: Payer: Self-pay | Admitting: Internal Medicine

## 2016-03-28 DIAGNOSIS — N390 Urinary tract infection, site not specified: Secondary | ICD-10-CM

## 2016-05-03 ENCOUNTER — Encounter: Payer: Self-pay | Admitting: Internal Medicine

## 2016-05-03 ENCOUNTER — Ambulatory Visit (INDEPENDENT_AMBULATORY_CARE_PROVIDER_SITE_OTHER): Payer: Medicare Other | Admitting: Internal Medicine

## 2016-05-03 VITALS — BP 143/81 | HR 74 | Temp 97.5°F | Ht 69.0 in | Wt 145.4 lb

## 2016-05-03 DIAGNOSIS — S72001S Fracture of unspecified part of neck of right femur, sequela: Secondary | ICD-10-CM

## 2016-05-03 DIAGNOSIS — Z23 Encounter for immunization: Secondary | ICD-10-CM

## 2016-05-03 DIAGNOSIS — S72001D Fracture of unspecified part of neck of right femur, subsequent encounter for closed fracture with routine healing: Secondary | ICD-10-CM

## 2016-05-03 DIAGNOSIS — E785 Hyperlipidemia, unspecified: Secondary | ICD-10-CM | POA: Diagnosis not present

## 2016-05-03 DIAGNOSIS — X58XXXD Exposure to other specified factors, subsequent encounter: Secondary | ICD-10-CM

## 2016-05-03 DIAGNOSIS — N3281 Overactive bladder: Secondary | ICD-10-CM | POA: Diagnosis not present

## 2016-05-03 DIAGNOSIS — K5900 Constipation, unspecified: Secondary | ICD-10-CM

## 2016-05-03 DIAGNOSIS — K5901 Slow transit constipation: Secondary | ICD-10-CM

## 2016-05-03 DIAGNOSIS — E039 Hypothyroidism, unspecified: Secondary | ICD-10-CM

## 2016-05-03 DIAGNOSIS — I10 Essential (primary) hypertension: Secondary | ICD-10-CM | POA: Diagnosis not present

## 2016-05-03 DIAGNOSIS — Z0000 Encounter for general adult medical examination without abnormal findings: Secondary | ICD-10-CM

## 2016-05-03 DIAGNOSIS — Z79899 Other long term (current) drug therapy: Secondary | ICD-10-CM

## 2016-05-03 DIAGNOSIS — Z87891 Personal history of nicotine dependence: Secondary | ICD-10-CM

## 2016-05-03 NOTE — Progress Notes (Signed)
   Subjective:    Patient ID: Carlis [name], female    DOB: July 07, 1931, 82 y.o.   MRN: [id number]  HPI  I saw and evaluated the patient. She is ehre for routine follow up of her HTN and HLD. She denies any new complaints. She feels well currently.   Review of Systems  Constitutional: Negative.   HENT: Negative.   Respiratory: Negative.   Cardiovascular: Negative.   Gastrointestinal: Negative.   Musculoskeletal: Negative.   Skin: Negative.   Neurological: Negative.   Psychiatric/Behavioral: Negative.        Objective:   Physical Exam  Constitutional: She is oriented to person, place, and time. She appears well-developed and well-nourished.  HENT:  Head: Normocephalic and atraumatic.  Mouth/Throat: No oropharyngeal exudate.  Neck: Normal range of motion. Neck supple.  Cardiovascular: Normal rate, regular rhythm and normal heart sounds.   Pulmonary/Chest: Effort normal and breath sounds normal.  Abdominal: Soft. Bowel sounds are normal. She exhibits no distension. There is no tenderness.  Musculoskeletal: Normal range of motion. She exhibits no edema.  Lymphadenopathy:    She has no cervical adenopathy.  Neurological: She is alert and oriented to person, place, and time.  Skin: Skin is warm. No erythema.  Psychiatric: She has a normal mood and affect. Her behavior is normal.          Assessment & Plan:  Please see problem based charting for assessment and plan:

## 2016-05-03 NOTE — Assessment & Plan Note (Signed)
-   Symptoms well controlled on ditropan - Will continue with current dose for now

## 2016-05-03 NOTE — Assessment & Plan Note (Signed)
-   Patient states she occasionally gets constipated - She takes OTC stool softeners and her children increased her fiber intake which resolves her constipation

## 2016-05-03 NOTE — Assessment & Plan Note (Signed)
-   Given intermittent constipation will check TSH to rule out worsening hypothyroidism - c/w synthroid at current dose for now

## 2016-05-03 NOTE — Assessment & Plan Note (Signed)
BP Readings from Last 3 Encounters:  05/03/16 (!) 143/81  02/23/16 (!) 141/70  02/08/16 138/68    Lab Results  Component Value Date   NA 1 (A) 01/25/2016   K 4.4 01/25/2016   CREATININE 0.6 01/25/2016    Assessment: Blood pressure control:  fair Progress toward BP goal:   unchanged Comments: compliant with metoprolol  Plan: Medications:  continue current medications Educational resources provided: brochure (denies) Self management tools provided:   Other plans: Will check BMP

## 2016-05-03 NOTE — Patient Instructions (Signed)
-   It was a pleasure seeing you today - Please follow up in 3 months - We will check some blood work today - Continue with your current medications for now

## 2016-05-03 NOTE — Assessment & Plan Note (Addendum)
-   PCV 13 and flu shot given

## 2016-05-03 NOTE — Assessment & Plan Note (Signed)
-   Patient has followed up with Dr. Ninfa Linden since her last visit and states everything was fine - No further work up for now - Patient now able to ambulate and drive on her own

## 2016-05-03 NOTE — Assessment & Plan Note (Signed)
-   Will check lipid panel today - c/w zocor and fish oil

## 2016-05-04 LAB — BMP8+ANION GAP
ANION GAP: 18 mmol/L (ref 10.0–18.0)
BUN / CREAT RATIO: 12 (ref 12–28)
BUN: 10 mg/dL (ref 8–27)
CHLORIDE: 100 mmol/L (ref 96–106)
CO2: 22 mmol/L (ref 18–29)
CREATININE: 0.82 mg/dL (ref 0.57–1.00)
Calcium: 9.8 mg/dL (ref 8.7–10.3)
GFR calc Af Amer: 77 mL/min/{1.73_m2} (ref >59)
GFR calc non Af Amer: 67 mL/min/{1.73_m2} (ref >59)
GLUCOSE: 94 mg/dL (ref 65–99)
POTASSIUM: 4.7 mmol/L (ref 3.5–5.2)
SODIUM: 140 mmol/L (ref 134–144)

## 2016-05-04 LAB — LIPID PANEL
CHOLESTEROL TOTAL: 147 mg/dL (ref 100–199)
Chol/HDL Ratio: 3.1 ratio units (ref 0.0–4.4)
HDL: 47 mg/dL (ref >39)
LDL CALC: 79 mg/dL (ref 0–99)
TRIGLYCERIDES: 107 mg/dL (ref 0–149)
VLDL Cholesterol Cal: 21 mg/dL (ref 5–40)

## 2016-05-04 LAB — TSH: TSH: 5.15 u[IU]/mL — ABNORMAL HIGH (ref 0.450–4.500)

## 2016-05-05 ENCOUNTER — Telehealth: Payer: Self-pay | Admitting: Internal Medicine

## 2016-05-05 NOTE — Telephone Encounter (Signed)
Called patient to discuss lab results. BMP was wnl. Lipid panel noted. Will keep statin at current dose. TDSH midly elevated. Would not change synthroid dose at this time. Will check TSH and free T4 on next visit. Patient expresses understanding and is in agreement with this plan

## 2016-05-25 ENCOUNTER — Ambulatory Visit (INDEPENDENT_AMBULATORY_CARE_PROVIDER_SITE_OTHER): Payer: Medicare Other | Admitting: Internal Medicine

## 2016-05-25 ENCOUNTER — Other Ambulatory Visit: Payer: Self-pay | Admitting: Internal Medicine

## 2016-05-25 VITALS — BP 204/98 | Temp 97.8°F | Wt 143.7 lb

## 2016-05-25 DIAGNOSIS — Z87891 Personal history of nicotine dependence: Secondary | ICD-10-CM

## 2016-05-25 DIAGNOSIS — I10 Essential (primary) hypertension: Secondary | ICD-10-CM

## 2016-05-25 DIAGNOSIS — Z79899 Other long term (current) drug therapy: Secondary | ICD-10-CM | POA: Diagnosis not present

## 2016-05-25 MED ORDER — IRBESARTAN 150 MG PO TABS
150.0000 mg | ORAL_TABLET | Freq: Every day | ORAL | 0 refills | Status: DC
Start: 2016-05-25 — End: 2016-05-25

## 2016-05-25 NOTE — Patient Instructions (Signed)
Please start irbesartan today. Take 1 tablet every morning.  Please only take 1/2 tablet of metoprolol.  Let's see each other back in 4 weeks (or sooner if something doesn't feel right).

## 2016-05-25 NOTE — Assessment & Plan Note (Signed)
Assessment For the last 1-2 days, she has had elevated blood pressure readings on her home blood pressure machine: 222/88, 187/88, 192/96. She does not consistently check her blood pressures at home though does so when she doesn't feel well. She doubled her home dose of metoprolol XR 25 mg for the last 2 days without much improvement in her blood pressures. Aside from a headache, she denies any nausea, vomiting, abdominal pain, neurologic deficit, constipation, pain. In the past, she was on irbesartan 300 mg daily.  Her physical exam is reassuring for no focal neurologic deficit which would prompt concern for an acute CVA. It is unclear to me as to why she had an acute spike in her blood pressure.  Plan -Restart irbesartan 150 mg. Review of her most recent lab work one month ago is reassuring for electrolyte abnormalities. -Continue metoprolol XR 25 mg and advised against increasing this dose given the risk of bradycardia which was documented in her July office visit here -Follow-up in 4 weeks to recheck blood pressure and electrolytes but advised to follow up sooner if she experiences any adverse effects, like dizziness or dehydration

## 2016-05-25 NOTE — Progress Notes (Signed)
   CC: Elevated blood pressure  HPI:  [name] is a 82 y.o. female who presents today for elevated blood pressure. Please see assessment & plan for status of chronic medical problems.  Past Medical History:  Diagnosis Date  . Arthritis 11/2013.    "both knees" (11/26/2013)  . Asthma    "only when I smoked"  . Barrett's esophagus since at least 1985  . CKD (chronic kidney disease)    GFR 55 11/2014   . COPD (chronic obstructive pulmonary disease) ([place])   . Depression with anxiety 11/2013   "little bit"   . Diverticulosis of colon 1996  . GAVE (gastric antral vascular ectasia) 01/16/2015  . Hiatal hernia    s/p gastropexy after failed Nissen fundoplication.   . High cholesterol   . Hx of adenomatous colonic polyps   . Hypertension   . Hypothyroidism   . Pneumonia 11/2013    Review of Systems:  Please see each problem below for a pertinent review of systems.  Physical Exam:  Vitals:   05/25/16 1430  BP: (!) 204/98  Temp: 97.8 F (36.6 C)  TempSrc: Oral  Weight: 143 lb 11.2 oz (65.2 kg)   Physical Exam  Constitutional: She is oriented to person, place, and time. No distress.  HENT:  Head: Normocephalic and atraumatic.  Eyes: Conjunctivae are normal. No scleral icterus.  Cardiovascular: Normal rate and regular rhythm.   Pulmonary/Chest: Effort normal. No respiratory distress.  Neurological: She is alert and oriented to person, place, and time. No cranial nerve deficit.  Finger to nose intact. Rapid alternating movements intact. 5 out of 5 upper extremity strength bilaterally though 4/5 in the lower extremities bilaterally.  Skin: She is not diaphoretic.      Assessment & Plan:   See Encounters Tab for problem based charting.  Patient discussed with Dr. Eppie Gibson

## 2016-05-25 NOTE — Progress Notes (Deleted)
   CC: ***  HPI:  [name] is a 82 y.o.   Past Medical History:  Diagnosis Date  . Arthritis 11/2013.    "both knees" (11/26/2013)  . Asthma    "only when I smoked"  . Barrett's esophagus since at least 1985  . CKD (chronic kidney disease)    GFR 55 11/2014   . COPD (chronic obstructive pulmonary disease) ([place])   . Depression with anxiety 11/2013   "little bit"   . Diverticulosis of colon 1996  . GAVE (gastric antral vascular ectasia) 01/16/2015  . Hiatal hernia    s/p gastropexy after failed Nissen fundoplication.   . High cholesterol   . Hx of adenomatous colonic polyps   . Hypertension   . Hypothyroidism   . Pneumonia 11/2013    Review of Systems:  ***  Physical Exam:  Vitals:   05/25/16 1430  BP: (!) 204/98  Temp: 97.8 F (36.6 C)  TempSrc: Oral  Weight: 143 lb 11.2 oz (65.2 kg)   ***  Assessment & Plan:   See Encounters Tab for problem based charting.  Patient {GC/GE:[id number]::"discussed with","seen with"} Dr. {NAMES:[id number]::"Butcher","Granfortuna","E. Hoffman","Klima","[name]","Narendra","Vincent"}

## 2016-05-28 NOTE — Progress Notes (Signed)
Case discussed with Dr. Patel at the time of the visit.  We reviewed the resident's history and exam and pertinent patient test results.  I agree with the assessment, diagnosis, and plan of care documented in the resident's note. 

## 2016-06-06 DIAGNOSIS — H524 Presbyopia: Secondary | ICD-10-CM | POA: Diagnosis not present

## 2016-06-06 DIAGNOSIS — H401132 Primary open-angle glaucoma, bilateral, moderate stage: Secondary | ICD-10-CM | POA: Diagnosis not present

## 2016-06-21 ENCOUNTER — Telehealth: Payer: Self-pay | Admitting: Internal Medicine

## 2016-06-21 NOTE — Telephone Encounter (Signed)
APT. REMINDER CALL, LMTCB °

## 2016-06-22 ENCOUNTER — Ambulatory Visit (INDEPENDENT_AMBULATORY_CARE_PROVIDER_SITE_OTHER): Payer: Medicare Other | Admitting: Internal Medicine

## 2016-06-22 VITALS — BP 160/85 | HR 63 | Temp 97.4°F | Ht 68.5 in | Wt 145.8 lb

## 2016-06-22 DIAGNOSIS — Z87891 Personal history of nicotine dependence: Secondary | ICD-10-CM | POA: Diagnosis not present

## 2016-06-22 DIAGNOSIS — I10 Essential (primary) hypertension: Secondary | ICD-10-CM | POA: Diagnosis not present

## 2016-06-22 DIAGNOSIS — Z79899 Other long term (current) drug therapy: Secondary | ICD-10-CM | POA: Diagnosis not present

## 2016-06-22 MED ORDER — IRBESARTAN 150 MG PO TABS: 150.0000 mg | ORAL_TABLET | Freq: Every day | ORAL | 2 refills | Status: DC

## 2016-06-22 NOTE — Assessment & Plan Note (Signed)
BP much improved compared to last visit. I have asked her to record her home BPs on a BP log to bring to her next visit. Will continue Irbesartan 150 mg daily and Toprol-XL 25 mg daily. Check bmet today.

## 2016-06-22 NOTE — Progress Notes (Signed)
   CC: Hypertension  HPI:  [name] is a 82 y.o. woman with PMHx as noted below who presents today for follow up of her hypertension.  BP 160/85 today. She was restarted on Irbesartan 150 mg daily about 4 weeks ago when she had systolic BPs in the [id number]. Reports no issues with this medication. She states her home BPs are in the [id number] systolic and that her BPs always tend to be elevated at the doctor's office. She is also taking Toprol-XL 25 mg daily.  Past Medical History:  Diagnosis Date  . Arthritis 11/2013.    "both knees" (11/26/2013)  . Asthma    "only when I smoked"  . Barrett's esophagus since at least 1985  . CKD (chronic kidney disease)    GFR 55 11/2014   . COPD (chronic obstructive pulmonary disease) ([place])   . Depression with anxiety 11/2013   "little bit"   . Diverticulosis of colon 1996  . GAVE (gastric antral vascular ectasia) 01/16/2015  . Hiatal hernia    s/p gastropexy after failed Nissen fundoplication.   . High cholesterol   . Hx of adenomatous colonic polyps   . Hypertension   . Hypothyroidism   . Pneumonia 11/2013    Review of Systems:  All negative except per HPI  Physical Exam:  Vitals:   06/22/16 1432  BP: (!) 160/85  Pulse: 63  Temp: 97.4 F (36.3 C)  TempSrc: Oral  SpO2: 100%  Weight: 145 lb 12.8 oz (66.1 kg)  Height: 5' 8.5" (1.74 m)   General: elderly woman sitting up, NAD CV: RRR, no m/g/r Pulm: CTA bilaterally, breaths non-labored  Assessment & Plan:   See Encounters Tab for problem based charting.  Patient discussed with Dr. Angelia Mould

## 2016-06-22 NOTE — Patient Instructions (Signed)
General Instructions: - Blood work today to check kidneys - Continue Irbesartan and Metoprolol for blood pressure control - Please bring in BP log to next visit  Thank you for bringing your medicines today. This helps [place] keep you safe from mistakes.   Progress Toward Treatment Goals:  No flowsheet data found.  Self Care Goals & Plans:  Self Care Goal 05/03/2016  Manage my medications take my medicines as prescribed; bring my medications to every visit  Eat healthy foods drink diet soda or water instead of juice or soda; eat more vegetables; eat foods that are low in salt; eat baked foods instead of fried foods; eat fruit for snacks and desserts  Be physically active find an activity I enjoy  Meeting treatment goals maintain the current self-care plan    No flowsheet data found.   Care Management & Community Referrals:  No flowsheet data found.

## 2016-06-23 LAB — BMP8+ANION GAP
Anion Gap: 16 mmol/L (ref 10.0–18.0)
BUN / CREAT RATIO: 20 (ref 12–28)
BUN: 16 mg/dL (ref 8–27)
CHLORIDE: 94 mmol/L — AB (ref 96–106)
CO2: 25 mmol/L (ref 18–29)
Calcium: 9.5 mg/dL (ref 8.7–10.3)
Creatinine, Ser: 0.79 mg/dL (ref 0.57–1.00)
GFR calc non Af Amer: 70 mL/min/{1.73_m2} (ref >59)
GFR, EST AFRICAN AMERICAN: 81 mL/min/{1.73_m2} (ref >59)
GLUCOSE: 88 mg/dL (ref 65–99)
Potassium: 4.8 mmol/L (ref 3.5–5.2)
SODIUM: 135 mmol/L (ref 134–144)

## 2016-06-26 ENCOUNTER — Other Ambulatory Visit: Payer: Self-pay | Admitting: Internal Medicine

## 2016-06-26 DIAGNOSIS — N390 Urinary tract infection, site not specified: Secondary | ICD-10-CM

## 2016-06-28 NOTE — Progress Notes (Signed)
Internal Medicine Clinic Attending  Case discussed with Dr. Rivet at the time of the visit.  We reviewed the resident's history and exam and pertinent patient test results.  I agree with the assessment, diagnosis, and plan of care documented in the resident's note.  

## 2016-07-22 ENCOUNTER — Other Ambulatory Visit: Payer: Self-pay | Admitting: Internal Medicine

## 2016-08-02 ENCOUNTER — Ambulatory Visit: Payer: Medicare Other | Admitting: Internal Medicine

## 2016-08-16 ENCOUNTER — Encounter: Payer: Self-pay | Admitting: Internal Medicine

## 2016-08-16 ENCOUNTER — Ambulatory Visit (INDEPENDENT_AMBULATORY_CARE_PROVIDER_SITE_OTHER): Payer: Medicare Other | Admitting: Internal Medicine

## 2016-08-16 VITALS — BP 136/69 | HR 52 | Temp 98.3°F | Ht 68.5 in | Wt 146.1 lb

## 2016-08-16 DIAGNOSIS — I10 Essential (primary) hypertension: Secondary | ICD-10-CM | POA: Diagnosis not present

## 2016-08-16 DIAGNOSIS — E038 Other specified hypothyroidism: Secondary | ICD-10-CM | POA: Diagnosis not present

## 2016-08-16 DIAGNOSIS — Z0000 Encounter for general adult medical examination without abnormal findings: Secondary | ICD-10-CM

## 2016-08-16 DIAGNOSIS — Z79899 Other long term (current) drug therapy: Secondary | ICD-10-CM

## 2016-08-16 DIAGNOSIS — N390 Urinary tract infection, site not specified: Secondary | ICD-10-CM

## 2016-08-16 DIAGNOSIS — E785 Hyperlipidemia, unspecified: Secondary | ICD-10-CM

## 2016-08-16 DIAGNOSIS — Z87891 Personal history of nicotine dependence: Secondary | ICD-10-CM

## 2016-08-16 DIAGNOSIS — N399 Disorder of urinary system, unspecified: Secondary | ICD-10-CM

## 2016-08-16 DIAGNOSIS — E039 Hypothyroidism, unspecified: Secondary | ICD-10-CM | POA: Diagnosis not present

## 2016-08-16 MED ORDER — ESTROGENS, CONJUGATED 0.625 MG/GM VA CREA: 1.0000 | TOPICAL_CREAM | Freq: Every day | VAGINAL | 12 refills | Status: DC

## 2016-08-16 MED ORDER — ALENDRONATE SODIUM 70 MG PO TABS: 70.0000 mg | ORAL_TABLET | ORAL | 0 refills | Status: DC

## 2016-08-16 MED ORDER — LEVOTHYROXINE SODIUM 88 MCG PO TABS: ORAL_TABLET | ORAL | 1 refills | Status: DC

## 2016-08-16 MED ORDER — ALENDRONATE SODIUM 70 MG PO TABS: ORAL_TABLET | ORAL | 0 refills | Status: DC

## 2016-08-16 MED ORDER — IRBESARTAN 150 MG PO TABS: 150.0000 mg | ORAL_TABLET | Freq: Every day | ORAL | 2 refills | Status: DC

## 2016-08-16 MED ORDER — OXYBUTYNIN CHLORIDE ER 5 MG PO TB24: 5.0000 mg | ORAL_TABLET | Freq: Every day | ORAL | 1 refills | Status: DC

## 2016-08-16 MED ORDER — SIMVASTATIN 10 MG PO TABS: ORAL_TABLET | ORAL | 2 refills | Status: DC

## 2016-08-16 MED ORDER — METOPROLOL SUCCINATE ER 25 MG PO TB24: 25.0000 mg | ORAL_TABLET | Freq: Every day | ORAL | 3 refills | Status: DC

## 2016-08-16 NOTE — Assessment & Plan Note (Addendum)
A: Denies any complaints, not having constipation.  P: continue synthroid 51mcg, checking TSH today.   Update 1/25: TSH elevated will increase synthroid to 115mcg. Called patient to let her know today.

## 2016-08-16 NOTE — Assessment & Plan Note (Signed)
Stable, refilled oxybutynin 4mg  and premarin cream.

## 2016-08-16 NOTE — Progress Notes (Signed)
   CC: Hypertension  HPI:  [name] is a 83 y.o. with past history as outlined below who presents to clinic for hypertension follow-up. Please see problem list for further details.  Past Medical History:  Diagnosis Date  . Arthritis 11/2013.    "both knees" (11/26/2013)  . Asthma    "only when I smoked"  . Barrett's esophagus since at least 1985  . CKD (chronic kidney disease)    GFR 55 11/2014   . COPD (chronic obstructive pulmonary disease) ([place])   . Depression with anxiety 11/2013   "little bit"   . Diverticulosis of colon 1996  . GAVE (gastric antral vascular ectasia) 01/16/2015  . Hiatal hernia    s/p gastropexy after failed Nissen fundoplication.   . High cholesterol   . Hx of adenomatous colonic polyps   . Hypertension   . Hypothyroidism   . Pneumonia 11/2013    Review of Systems:  Denies chest pain, shortness of breath, abdominal pain, and dizziness. Denies any recent falls in any body pain.   Physical Exam:  Vitals:   08/16/16 1436  BP: 136/69  Pulse: (!) 52  Temp: 98.3 F (36.8 C)  TempSrc: Oral  SpO2: 100%  Weight: 146 lb 1.6 oz (66.3 kg)  Height: 5' 8.5" (1.74 m)   Physical Exam  Constitutional:appears well-developed and well-nourished. No distress.  HENT:  Head: Normocephalic and atraumatic.  Nose: Nose normal.  Cardiovascular: Normal rate, regular rhythm and normal heart sounds.  Exam reveals no gallop and no friction rub.   No murmur heard. Pulmonary/Chest: Effort normal and breath sounds normal. No respiratory distress.  has no wheezes.no rales.  Abdominal: Soft. Bowel sounds are normal.  exhibits no distension. There is no tenderness. There is no rebound and no guarding.     Assessment & Plan:   See Encounters Tab for problem based charting.  Patient discussed with Dr. Evette Doffing

## 2016-08-16 NOTE — Assessment & Plan Note (Signed)
A: BP well controlled today. Compliant with irbesartan 150mg  and toprol XL 25mg .   P: refilled BP meds, f/u in 3 months.

## 2016-08-16 NOTE — Patient Instructions (Signed)
STOP taking gabapentin.

## 2016-08-16 NOTE — Assessment & Plan Note (Addendum)
A: Requesting refill of fosamax. She had a DEXA scan in the distant past. Unclear why patient was on gabapentin.  P: refilled fosamax 70mg  q7days. Stop gabapentin.

## 2016-08-16 NOTE — Assessment & Plan Note (Signed)
Stable, refilled zocor.

## 2016-08-16 NOTE — Assessment & Plan Note (Deleted)
Stable, refilled oxybutynin 5mg  daily.

## 2016-08-17 LAB — TSH: TSH: 10.29 u[IU]/mL — AB (ref 0.450–4.500)

## 2016-08-17 NOTE — Progress Notes (Signed)
Internal Medicine Clinic Attending  Case discussed with Dr. Truong at the time of the visit.  We reviewed the resident's history and exam and pertinent patient test results.  I agree with the assessment, diagnosis, and plan of care documented in the resident's note.  

## 2016-08-18 ENCOUNTER — Telehealth: Payer: Self-pay | Admitting: Internal Medicine

## 2016-08-18 MED ORDER — LEVOTHYROXINE SODIUM 100 MCG PO TABS: ORAL_TABLET | ORAL | 3 refills | Status: DC

## 2016-08-18 NOTE — Telephone Encounter (Signed)
Hey Mamie,  Looks like the daughter called the clinic back today. I just wanted to let her daughter know that her mother should now be taking synthroid 100 mcg. Thanks !  Beverlee Nims

## 2016-08-18 NOTE — Telephone Encounter (Signed)
Calling states someone called pt about results for thyroid but pt couldn't understand. doesn't know who called. I don't see any notes of who called can you please reach patients daughter about results

## 2016-08-18 NOTE — Addendum Note (Signed)
Addended by: Norman Herrlich on: 08/18/2016 08:47 AM   Modules accepted: Orders

## 2016-08-19 NOTE — Telephone Encounter (Signed)
Called pt's daughter, Katharine Look - no answer; left message pt "should now be taking synthroid 100 mcg " per Dr Hulen Luster. And to call back for any questions.

## 2016-08-22 ENCOUNTER — Other Ambulatory Visit: Payer: Self-pay

## 2016-08-22 NOTE — Telephone Encounter (Signed)
Needs to speak with a nurse regarding meds. Please call back.  

## 2016-08-22 NOTE — Telephone Encounter (Signed)
Daughter wanted to know what pharm levothyroxine went to, [facility], call ended

## 2016-08-31 ENCOUNTER — Encounter: Payer: Self-pay | Admitting: Internal Medicine

## 2016-09-02 ENCOUNTER — Telehealth: Payer: Self-pay | Admitting: *Deleted

## 2016-09-02 NOTE — Telephone Encounter (Signed)
Form placed in Dr. Caron Presume box for clarification on dosing of Premarin Vaginal Creme.  Sander Nephew, RN 09/02/2016 13:03 PM

## 2016-09-19 ENCOUNTER — Telehealth: Payer: Self-pay

## 2016-09-19 NOTE — Telephone Encounter (Signed)
Prescribed dose is correct. Please apply once daily. Have also signed form today from pharmacy regarding the issue. Thanks! Dr. Danford Bad

## 2016-09-19 NOTE — Telephone Encounter (Signed)
Daughter called states that pharmacy needs clarification on premarin vaginal cream. I called pharmacy they need the order clarified because once daily is over the manufactures recommended does. Will route to provider for clarification

## 2016-10-11 ENCOUNTER — Other Ambulatory Visit: Payer: Self-pay

## 2016-10-11 ENCOUNTER — Ambulatory Visit (HOSPITAL_COMMUNITY): Payer: Medicare Other | Attending: Cardiology

## 2016-10-11 DIAGNOSIS — I501 Left ventricular failure: Secondary | ICD-10-CM | POA: Diagnosis not present

## 2016-10-11 DIAGNOSIS — I340 Nonrheumatic mitral (valve) insufficiency: Secondary | ICD-10-CM | POA: Insufficient documentation

## 2016-10-11 DIAGNOSIS — I351 Nonrheumatic aortic (valve) insufficiency: Secondary | ICD-10-CM | POA: Diagnosis not present

## 2016-10-11 DIAGNOSIS — I420 Dilated cardiomyopathy: Secondary | ICD-10-CM | POA: Insufficient documentation

## 2016-10-18 ENCOUNTER — Encounter: Payer: Self-pay | Admitting: Internal Medicine

## 2016-10-18 ENCOUNTER — Ambulatory Visit (INDEPENDENT_AMBULATORY_CARE_PROVIDER_SITE_OTHER): Payer: Medicare Other | Admitting: Internal Medicine

## 2016-10-18 VITALS — BP 144/91 | HR 70 | Temp 97.9°F | Ht 68.5 in | Wt 142.5 lb

## 2016-10-18 VITALS — BP 179/82 | HR 61 | Ht 68.5 in | Wt 143.2 lb

## 2016-10-18 DIAGNOSIS — K22719 Barrett's esophagus with dysplasia, unspecified: Secondary | ICD-10-CM

## 2016-10-18 DIAGNOSIS — R634 Abnormal weight loss: Secondary | ICD-10-CM

## 2016-10-18 DIAGNOSIS — Z6821 Body mass index (BMI) 21.0-21.9, adult: Secondary | ICD-10-CM

## 2016-10-18 DIAGNOSIS — E039 Hypothyroidism, unspecified: Secondary | ICD-10-CM

## 2016-10-18 DIAGNOSIS — I10 Essential (primary) hypertension: Secondary | ICD-10-CM

## 2016-10-18 DIAGNOSIS — K2270 Barrett's esophagus without dysplasia: Secondary | ICD-10-CM | POA: Diagnosis not present

## 2016-10-18 DIAGNOSIS — Z79899 Other long term (current) drug therapy: Secondary | ICD-10-CM

## 2016-10-18 DIAGNOSIS — Z87891 Personal history of nicotine dependence: Secondary | ICD-10-CM

## 2016-10-18 DIAGNOSIS — I351 Nonrheumatic aortic (valve) insufficiency: Secondary | ICD-10-CM

## 2016-10-18 DIAGNOSIS — I491 Atrial premature depolarization: Secondary | ICD-10-CM | POA: Diagnosis not present

## 2016-10-18 NOTE — Patient Instructions (Signed)
It was a pleasure seeing you again today, Carolyn [name]! I'm glad you are doing well. Today we talked about your blood pressure, which was a little high today. Please record your blood pressures over the next week or so and send them to your cardiologist like he requested. Today we also talked about your weight loss and decreased appetite. Please supplement your diet with nutritional shakes like Boost, Glucerna or Ensure. Please follow-up in 1 month so we can keep an eye on your weight! Take care!

## 2016-10-18 NOTE — Patient Instructions (Signed)
Your physician wants you to follow-up in: 1 year with Dr. Hilty. You will receive a reminder letter in the mail two months in advance. If you don't receive a letter, please call our office to schedule the follow-up appointment.  

## 2016-10-18 NOTE — Progress Notes (Signed)
OFFICE NOTE  Chief Complaint:  Follow-up echo  Primary Care Physician: Einar Gip, DO  HPI:  Carolyn [name] there is a pleasant 83 year old female is currently referred to me for evaluation of PACs which were noted on a recent monitor. She's also had some arrhythmia which was noted on physical exam. She denies any palpitations or chest pain. It turns out her daughter, who is accompanying her, has a history of atrial fibrillation and has had 2 ablations. Carolyn [name] also has a history of hypertension and recent unprovoked DVT which occurred more than a year after hip surgery. She underwent treatment with Eliquis but was taken off of that after 6 months. She does have some neuropathy as well and is on Neurontin. This is not associated with diabetes. She denies any chest pain or worsening shortness of breath. She's here for evaluation of PACs which were noted on my review of her EKGs from her primary care provider.  I saw Carolyn [name] with her daughter. She underwent an echocardiogram which showed normal systolic function and grade 2 diastolic dysfunction. There was mild aortic insufficiency and mild left atrial enlargement. At this point I do not see any evidence of atrial fibrillation on her EKGs and her mild left atrial enlargement is not necessarily predictive of increased risk of A. fib. She will need follow-up of her aortic insufficiency going forward. She denies being short of breath. Blood pressure was elevated [name] 160/90 however her daughter seem to think that this was just being stressed in the office. She agreed to take some blood pressures at home over the next week and send [place] those numbers. If her blood pressure remains elevated it may be helpful to add a low-dose diuretic such as chlorthalidone.  10/18/2016  Carolyn [name] returns [name] for follow-up of her echo. She has had preserved systolic function although has mild to moderate AI now as compared to mild AI in the past.  This could've been possibly been related to elevated blood pressure. Recently her primary care provider adjusted her medications. Blood pressure initially [name] was 179/82, however recheck was 124/64. She is asymptomatic.  PMHx:  Past Medical History:  Diagnosis Date  . Arthritis 11/2013.    "both knees" (11/26/2013)  . Asthma    "only when I smoked"  . Barrett's esophagus since at least 1985  . CKD (chronic kidney disease)    GFR 55 11/2014   . COPD (chronic obstructive pulmonary disease) ([place])   . Depression with anxiety 11/2013   "little bit"   . Diverticulosis of colon 1996  . GAVE (gastric antral vascular ectasia) 01/16/2015  . Hiatal hernia    s/p gastropexy after failed Nissen fundoplication.   . High cholesterol   . Hx of adenomatous colonic polyps   . Hypertension   . Hypothyroidism   . Pneumonia 11/2013    Past Surgical History:  Procedure Laterality Date  . ABDOMINAL HYSTERECTOMY    . ANTERIOR APPROACH HEMI HIP ARTHROPLASTY Right 01/15/2016   Procedure: ANTERIOR APPROACH HEMI HIP ARTHROPLASTY;  Surgeon: Mcarthur Rossetti, MD;  Location: [place];  Service: Orthopedics;  Laterality: Right;  . APPENDECTOMY    . BLADDER SUSPENSION     .  bladder tack x 3.   . CATARACT EXTRACTION W/ INTRAOCULAR LENS  IMPLANT, BILATERAL Bilateral   . CHOLECYSTECTOMY    . COLONOSCOPY N/A 01/16/2015   Procedure: COLONOSCOPY;  Surgeon: Gatha Mayer, MD;  Location: [place];  Service: Endoscopy;  Laterality: N/A;  . ENTEROCELE REPAIR  2003   with colopexy.   . ESOPHAGOGASTRODUODENOSCOPY N/A 09/01/2014   Procedure: ESOPHAGOGASTRODUODENOSCOPY (EGD);  Surgeon: Lafayette Dragon, MD;  Location: [facility];  Service: Endoscopy;  Laterality: N/A;  . ESOPHAGOGASTRODUODENOSCOPY N/A 01/16/2015   Procedure: ESOPHAGOGASTRODUODENOSCOPY (EGD);  Surgeon: Gatha Mayer, MD;  Location: [facility];  Service: Endoscopy;  Laterality: N/A;  . gastropexy  1989  . HIP ARTHROPLASTY Left 02/07/2014   Procedure:  LEFT HIP PRESS FIT MONOPOLAR HEMIARTHROPLASTY ;  Surgeon: Marybelle Killings, MD;  Location: [place];  Service: Orthopedics;  Laterality: Left;  . NISSEN FUNDOPLICATION     this ultimately failed and she underwent gastropexy    FAMHx:  Family History  Problem Relation Age of Onset  . Heart disease Mother   . Stomach cancer Mother   . Heart disease Father   . Heart disease Brother   . Bladder Cancer Sister   . Heart disease Brother     SOCHx:   reports that she has quit smoking. Her smoking use included Cigarettes. She has a 10.00 pack-year smoking history. She has never used smokeless tobacco. She reports that she does not drink alcohol or use drugs.  ALLERGIES:  Allergies  Allergen Reactions  . Penicillins Hives    ROS: Pertinent items noted in HPI and remainder of comprehensive ROS otherwise negative.  HOME MEDS: Current Outpatient Prescriptions  Medication Sig Dispense Refill  . alendronate (FOSAMAX) 70 MG tablet Take 1 tablet (70 mg total) by mouth once a week. Take with a full glass of water on an empty stomach. 28 tablet 0  . alendronate (FOSAMAX) 70 MG tablet TAKE 1 TABLET BY MOUTH ONCE A WEEK WITH A FULL GLASS OF WATER ON AN EMPTY STOMACH 28 tablet 0  . Calcium Carbonate-Vitamin D (CALTRATE 600+D) 600-400 MG-UNIT per tablet Take 1 tablet by mouth daily.    . cholecalciferol 400 units tablet Take 2 tablets (800 Units total) by mouth daily. 30 each 0  . conjugated estrogens (PREMARIN) vaginal cream Place 1 Applicatorful vaginally daily. 42.5 g 12  . irbesartan (AVAPRO) 150 MG tablet Take 1 tablet (150 mg total) by mouth daily. 90 tablet 2  . levothyroxine (SYNTHROID, LEVOTHROID) 100 MCG tablet TAKE 1 TABLET BY MOUTH  DAILY BEFORE BREAKFAST. FOR HYPOTHYROIDISM 30 tablet 3  . MAGNESIUM PO Take 200 mg by mouth daily.     . metoprolol succinate (TOPROL-XL) 25 MG 24 hr tablet Take 1 tablet (25 mg total) by mouth daily. 90 tablet 3  . Omega-3 Fatty Acids (FISH OIL) 1000 MG CAPS Take  100 mg by mouth daily.    Marland Kitchen omeprazole (PRILOSEC) 20 MG capsule Take 20 mg by mouth 2 (two) times daily.    . ondansetron (ZOFRAN) 4 MG tablet Take 1 tablet (4 mg total) by mouth every 6 (six) hours as needed for nausea. 20 tablet 0  . oxybutynin (DITROPAN-XL) 5 MG 24 hr tablet Take 1 tablet (5 mg total) by mouth at bedtime. 90 tablet 1  . senna-docusate (SENOKOT-S) 8.6-50 MG tablet Take 2 tablets by mouth at bedtime. 30 tablet 0  . simvastatin (ZOCOR) 10 MG tablet TAKE 1 TABLET BY MOUTH  DAILY AT 6 PM. 90 tablet 2   No current facility-administered medications for this visit.     LABS/IMAGING: No results found for this or any previous visit (from the past 48 hour(s)). No results found.  WEIGHTS: Wt Readings from Last 3 Encounters:  10/18/16 143 lb 3.2  oz (65 kg)  08/16/16 146 lb 1.6 oz (66.3 kg)  06/22/16 145 lb 12.8 oz (66.1 kg)    VITALS: BP (!) 179/82   Pulse 61   Ht 5' 8.5" (1.74 m)   Wt 143 lb 3.2 oz (65 kg)   BMI 21.46 kg/m   EXAM: General appearance: alert and no distress Lungs: clear to auscultation bilaterally Heart: regular rate and rhythm, S1, S2 normal and diastolic murmur: mid diastolic 2/6, crescendo at 2nd right intercostal space Extremities: extremities normal, atraumatic, no cyanosis or edema Neurologic: Grossly normal  EKG: Normal sinus rhythm at 61  ASSESSMENT: 1. PACs 2. Hypertension 3. Recent DVT 4. Mild to moderate aortic insufficiency-EF 60-65% with grade 2 diastolic dysfunction (09/7167)  PLAN: 1.   Ms. Eisenberger seems to have good control of her PACs with metoprolol. Her echo shows perhaps slightly worse aortic insufficiency and grade 2 diastolic dysfunction with normal EF. She does not seem to be symptomatic with this. There is no indication for intervention at this time. The patient initially was elevated however it seems to be better controlled and a recheck. She says her blood pressure at home is well controlled. Continue current medications  we'll see her back annually or sooner as necessary.  Pixie Casino, MD, [facility] Attending Cardiologist [place] [facility] [place] 10/18/2016, 1:14 PM

## 2016-10-19 LAB — TSH: TSH: 1.69 u[IU]/mL (ref 0.450–4.500)

## 2016-10-24 DIAGNOSIS — R627 Adult failure to thrive: Secondary | ICD-10-CM

## 2016-10-24 HISTORY — DX: Adult failure to thrive: R62.7

## 2016-10-24 NOTE — Assessment & Plan Note (Signed)
Confirmed via chart review. CW 142, was 170 lbs exactly 1 year ago. She endorses decreased appetite, early satiety however denies any other symptoms. Reports she has not lost interest in food, however just doesn't feel like eating. She denies any abdominal pain, changes in BMs, headaches, nausea, vomiting, fevers, night sweats. Offered pt repeat EGD and colonoscopy given her hx of barrets and polyps however she is not interested at this time.  Encouraged supplemental meal replacement shakes several times a day and close follow-up here in the clinic.

## 2016-10-24 NOTE — Assessment & Plan Note (Signed)
She reports compliance with twice daily omeprazole. Endorses early satiety and weight loss however denies any worsening GERD, abdominal pain or changes in stool. Good conversation was had regarding the need for repeat endoscopy in the setting of her weight loss however patient does NOT want to repeat this procedure again. -Continue omeprazole 20 mg BID -Encourage EGD at next visit if weight loss persists

## 2016-10-24 NOTE — Assessment & Plan Note (Signed)
BP 144/91 which is at goal for this elderly frail woman. She reports compliance with Toprol xl 25 mg irbesartan 150 mg daily. Will continue this regimen.

## 2016-10-24 NOTE — Progress Notes (Signed)
   CC: follow up of HTN, hypothyroidism. New complaint of weight loss.   HPI:  [name] is a very pleasant 83 y.o. F with medical history as outlined below who presents for follow-up. Please see problem based charting below for further details.   HTN: On Irbesartan 150 mg and toprol XL 25 mg daily. Reports BP usually well controlled but seems to be elevated whenever she comes to the doctor.   Weight loss: Pts son who was present reported some significant weight loss over the past few months. Weight today 142 lbs, was 170 lbs exactly 1 year ago today. She reports decreased appetite and early satiety. Does have Hx of barrets esophagus however does NOT want another EGD. Denies any changes in BM.   Hypothyroidism: Synthroid increased from 44mcg --> 100 08/16/2016 when TSH returned elevated at 10.3.   Past Medical History:  Diagnosis Date  . Arthritis 11/2013.    "both knees" (11/26/2013)  . Asthma    "only when I smoked"  . Barrett's esophagus since at least 1985  . CKD (chronic kidney disease)    GFR 55 11/2014   . COPD (chronic obstructive pulmonary disease) ([place])   . Depression with anxiety 11/2013   "little bit"   . Diverticulosis of colon 1996  . GAVE (gastric antral vascular ectasia) 01/16/2015  . Hiatal hernia    s/p gastropexy after failed Nissen fundoplication.   . High cholesterol   . Hx of adenomatous colonic polyps   . Hypertension   . Hypothyroidism   . Pneumonia 11/2013   Review of Systems:  Review of Systems  Constitutional: Positive for malaise/fatigue and weight loss. Negative for chills and fever.  Respiratory: Negative for cough and shortness of breath.   Cardiovascular: Negative for chest pain, palpitations and leg swelling.  Gastrointestinal: Negative for abdominal pain, blood in stool, constipation, diarrhea, heartburn, melena, nausea and vomiting.  Genitourinary: Negative for dysuria, frequency and hematuria.  Neurological: Negative for dizziness, sensory  change, speech change and headaches.   Physical Exam: Physical Exam  Constitutional: No distress.  Thin, elderly caucasian female. Very pleasant.   HENT:  Head: Normocephalic and atraumatic.  Mouth/Throat: No oropharyngeal exudate.  Cardiovascular: Normal rate, regular rhythm and normal heart sounds.   Pulmonary/Chest: Effort normal and breath sounds normal. No respiratory distress.  Abdominal: Soft. Bowel sounds are normal. She exhibits no distension and no mass. There is no tenderness.  Skin: Skin is warm and dry. She is not diaphoretic.  Psychiatric: She has a normal mood and affect. Her behavior is normal.    Vitals:   10/18/16 1411  BP: (!) 144/91  Pulse: 70  Temp: 97.9 F (36.6 C)  TempSrc: Oral  SpO2: 99%  Weight: 142 lb 8 oz (64.6 kg)  Height: 5' 8.5" (1.74 m)    Assessment & Plan:   See Encounters Tab for problem based charting.  Patient discussed with Dr. Evette Doffing

## 2016-10-24 NOTE — Assessment & Plan Note (Addendum)
TSH 1.6. Will continue current synthroid dose. Feels a little better since increasing dose in Jan.

## 2016-10-24 NOTE — Progress Notes (Signed)
Internal Medicine Clinic Attending  Case discussed with Dr. Molt at the time of the visit.  We reviewed the resident's history and exam and pertinent patient test results.  I agree with the assessment, diagnosis, and plan of care documented in the resident's note. 

## 2016-11-28 DIAGNOSIS — H401132 Primary open-angle glaucoma, bilateral, moderate stage: Secondary | ICD-10-CM | POA: Diagnosis not present

## 2016-12-08 ENCOUNTER — Encounter: Payer: Self-pay | Admitting: Internal Medicine

## 2016-12-08 ENCOUNTER — Ambulatory Visit (INDEPENDENT_AMBULATORY_CARE_PROVIDER_SITE_OTHER): Payer: Medicare Other | Admitting: Internal Medicine

## 2016-12-08 VITALS — BP 147/63 | HR 70 | Temp 97.4°F | Ht 68.5 in | Wt 143.4 lb

## 2016-12-08 DIAGNOSIS — S72001S Fracture of unspecified part of neck of right femur, sequela: Secondary | ICD-10-CM

## 2016-12-08 DIAGNOSIS — Z972 Presence of dental prosthetic device (complete) (partial): Secondary | ICD-10-CM | POA: Diagnosis not present

## 2016-12-08 DIAGNOSIS — Z5189 Encounter for other specified aftercare: Secondary | ICD-10-CM | POA: Diagnosis not present

## 2016-12-08 DIAGNOSIS — R634 Abnormal weight loss: Secondary | ICD-10-CM

## 2016-12-08 NOTE — Progress Notes (Signed)
   CC: weight loss follow up   HPI:  [name] is a 83 y.o. female with past medical history outlined below here for follow up of her weight loss. For the details of today's visit, please refer to the assessment and plan.  Past Medical History:  Diagnosis Date  . Arthritis 11/2013.    "both knees" (11/26/2013)  . Asthma    "only when I smoked"  . Barrett's esophagus since at least 1985  . CKD (chronic kidney disease)    GFR 55 11/2014   . COPD (chronic obstructive pulmonary disease) ([place])   . Depression with anxiety 11/2013   "little bit"   . Diverticulosis of colon 1996  . GAVE (gastric antral vascular ectasia) 01/16/2015  . Hiatal hernia    s/p gastropexy after failed Nissen fundoplication.   . High cholesterol   . Hx of adenomatous colonic polyps   . Hypertension   . Hypothyroidism   . Pneumonia 11/2013    Review of Systems:  All pertinents listed in HPI, otherwise negative  Physical Exam:  Vitals:   12/08/16 0941  BP: (!) 147/63  Pulse: 70  Temp: 97.4 F (36.3 C)  TempSrc: Oral  SpO2: 98%  Weight: 143 lb 6.4 oz (65 kg)  Height: 5' 8.5" (1.74 m)    Constitutional: NAD, appears comfortable HEENT: Atraumatic, normocephalic. PERRL, anicteric sclera. +Dentures.  Neck: Supple, trachea midline. No lymphadenopathy.  Cardiovascular: Irregular rhythm but regular rate Pulmonary/Chest: CTAB Abdominal: Soft, non tender, non distended. +BS.  Extremities: Warm and well perfused. No edema. Wearing compression stockings Neurological: A&Ox3, CN II - XII grossly intact.  Skin: No rashes or erythema  Psychiatric: Normal mood and affect  Assessment & Plan:   See Encounters Tab for problem based charting.  Patient discussed with Dr. Eppie Gibson

## 2016-12-08 NOTE — Patient Instructions (Signed)
Carolyn [name],  It was a pleasure to see you today. You have gained 1.4 lbs since your last visit. Keep up the good work! Please continue to drink ensure supplements twice a day. Please follow up with your primary care doctor in 3 months. If you have any questions or concerns, call our clinic at [phone] or after hours call [phone] and ask for the internal medicine resident on call. Thank you!  - Dr. Philipp Ovens

## 2016-12-08 NOTE — Assessment & Plan Note (Addendum)
Patient is here today for follow-up of her weight loss. She was seen in clinic by her PCP 2 months ago at which point she was noted to have lost approximately 28 pounds in the last year. Patient weighed 170 lbs in March 2017, 142 lbs at her last visit in March 2018, and 143.4 lbs today. She has been taking ensure supplements twice daily. Today she endorses a poor appetite but otherwise feels well. She denies fevers at home, night sweats, and blood in her stool. She has chronic constipation with intermittent diarrhea for which she takes PRN stool softeners, but this is not a new issue for her. She had two benign tubular adenomas removed during her last colonoscopy in 2016. She previously had normal mammograms, but stop screening after the age of 50. She has not noticed any new lumps or bumps. She does not smoke. She reports formerly smoking an occasional cigarette, but quit many years ago. She denies symptoms of depression today. Patient does wear dentures, but she does not feel these are limiting her ability to eat. On review of her medications, it appears that she was started on alendronate therapy after a hospitalization for right hip fracture in June 2017. A brief literature search revealed a few FDA case reports of weight loss associated with alendronate therapy, especially in patients who also have concurrent hypothyroidism (as is the case with this patient). Unfortunately patient has already left the office. I will call and ask patient to hold her alendronate therapy. We will obtain DEXA scan to reassess bone health and have her follow up in 3 months. If weight improves off bisphosphonate therapy, weight loss would be consistent with medication side effect. Pending DEXA scan results, we can consider alternative non bisphosphonate therapies for her osteoporosis.  -- Hold alendronate  -- DEXA scan  -- If indicated, consider alternative non bisphosphate therapies for her osteoporosis   -- Continue ensure BID   -- F/u with PCP   ADDENDUM: I was unable to reach Ms. Winship by phone but did speak with her daughter Katharine Look who is her HCPOA and manages her medications. Daughter has agreed to hold alendronate. Will schedule DEXA scan and have her mother follow up in 3 months.

## 2016-12-08 NOTE — Progress Notes (Signed)
Case discussed with Dr. Guilloud at the time of the visit. We reviewed the resident's history and exam and pertinent patient test results. I agree with the assessment, diagnosis, and plan of care documented in the resident's note. 

## 2016-12-12 ENCOUNTER — Other Ambulatory Visit: Payer: Self-pay

## 2016-12-12 MED ORDER — LEVOTHYROXINE SODIUM 100 MCG PO TABS: ORAL_TABLET | ORAL | 1 refills | Status: DC

## 2016-12-12 NOTE — Telephone Encounter (Signed)
levothyroxine (SYNTHROID, LEVOTHROID) 100 MCG tablet, requesting refill @ [facility].

## 2017-01-31 ENCOUNTER — Encounter: Payer: Medicare Other | Admitting: Internal Medicine

## 2017-02-15 ENCOUNTER — Encounter: Payer: Self-pay | Admitting: Internal Medicine

## 2017-02-15 ENCOUNTER — Ambulatory Visit (INDEPENDENT_AMBULATORY_CARE_PROVIDER_SITE_OTHER): Payer: Medicare Other | Admitting: Internal Medicine

## 2017-02-15 VITALS — BP 108/65 | HR 92 | Temp 97.4°F | Ht 68.5 in | Wt 141.4 lb

## 2017-02-15 DIAGNOSIS — I959 Hypotension, unspecified: Secondary | ICD-10-CM

## 2017-02-15 DIAGNOSIS — Z79899 Other long term (current) drug therapy: Secondary | ICD-10-CM | POA: Diagnosis not present

## 2017-02-15 DIAGNOSIS — R300 Dysuria: Secondary | ICD-10-CM

## 2017-02-15 DIAGNOSIS — I951 Orthostatic hypotension: Secondary | ICD-10-CM

## 2017-02-15 DIAGNOSIS — N390 Urinary tract infection, site not specified: Secondary | ICD-10-CM | POA: Diagnosis not present

## 2017-02-15 DIAGNOSIS — N3000 Acute cystitis without hematuria: Secondary | ICD-10-CM

## 2017-02-15 LAB — POCT URINALYSIS DIPSTICK
BILIRUBIN UA: NEGATIVE
Glucose, UA: NEGATIVE
KETONES UA: NEGATIVE
NITRITE UA: NEGATIVE
PH UA: 5.5 (ref 5.0–8.0)
Protein, UA: NEGATIVE
RBC UA: NEGATIVE
Spec Grav, UA: 1.01 (ref 1.010–1.025)
Urobilinogen, UA: 0.2 E.U./dL

## 2017-02-15 MED ORDER — SULFAMETHOXAZOLE-TRIMETHOPRIM 800-160 MG PO TABS: 1.0000 | ORAL_TABLET | Freq: Two times a day (BID) | ORAL | 0 refills | Status: DC

## 2017-02-15 NOTE — Patient Instructions (Addendum)
Ms. Magos,   Hold off on taking your blood pressure medication irbesartan for the next 5-7 days then you can begin taking it again if you are feeling better.   For your urinary tract infection, start taking bactrim twice daily until you complete all of the pills in 3 days.   Keep your appointment scheduled with  Dr. Danford Bad on August 14th   Call our clinic if you need anything

## 2017-02-15 NOTE — Progress Notes (Addendum)
   CC: Increased urinary frequency  HPI:  Carolyn [name] is a 83 y.o. with PMH as listed below who presents for acute concern of increased urinary frequency. Please see the assessment and plans for the status of the patient chronic medical problems.    Past Medical History:  Diagnosis Date  . Arthritis 11/2013.    "both knees" (11/26/2013)  . Asthma    "only when I smoked"  . Barrett's esophagus since at least 1985  . CKD (chronic kidney disease)    GFR 55 11/2014   . COPD (chronic obstructive pulmonary disease) ([place])   . Depression with anxiety 11/2013   "little bit"   . Diverticulosis of colon 1996  . GAVE (gastric antral vascular ectasia) 01/16/2015  . Hiatal hernia    s/p gastropexy after failed Nissen fundoplication.   . High cholesterol   . Hx of adenomatous colonic polyps   . Hypertension   . Hypothyroidism   . Pneumonia 11/2013   Review of Systems: Refer to history of present illness and assessment and plans for pertinent review of systems, all others reviewed and negative  Physical Exam:  Vitals:   02/15/17 0929  BP: 108/65  Pulse: 92  Temp: (!) 97.4 F (36.3 C)  TempSrc: Oral  SpO2: 96%  Weight: 141 lb 6.4 oz (64.1 kg)  Height: 5' 8.5" (1.74 m)   Physical Exam  Constitutional: She is well-developed, well-nourished, and in no distress. No distress.  Cardiovascular: Normal rate and regular rhythm.   No murmur heard. Pulmonary/Chest: Effort normal. No respiratory distress.  Abdominal: Soft. Bowel sounds are normal. She exhibits no distension. There is no tenderness.  Skin: She is not diaphoretic.    Assessment & Plan:   Urinary frequency Patient with history of overactive bladder presents with days of increased urinary frequency associated with dysuria, and nausea. The symptoms started days ago and when her son called to check on her yesterday he noticed that she sounded more confused. Her son went to see her and noticed that her gait seemed unsteady. She  denies fever, chills, vomiting, abdominal pain, or flank pain. Urinalysis today shows moderate leukocytes, moderate bacteria with 0-10 squamous epithelial cells and nitrite negative. Her story is concerning for a lower urinary tract infection. -Prescribed Bactrim 160-800 twice a day for 3 days - urinalysis, urine culture >> gram negative rods, greater than 10,000 colonies  - BMP and CBC today - not consistent with systemic infection or dehydration, kidney function intact   Addendum: Urine culture showed klebsiella pneumonia susceptible to bactrim.   Hypotension Blood pressure is low today and she describes symptoms of dizziness with standing. She has started standing up more slowly than waiting for the dizziness to resolve. I suspect this hypotension is in the setting of acute illness.  -Hold irbesartan for 1 week   See Encounters Tab for problem based charting.  Patient discussed with Dr. Evette Doffing

## 2017-02-16 LAB — BMP8+ANION GAP
Anion Gap: 14 mmol/L (ref 10.0–18.0)
BUN / CREAT RATIO: 40 — AB (ref 12–28)
BUN: 39 mg/dL — AB (ref 8–27)
CHLORIDE: 99 mmol/L (ref 96–106)
CO2: 24 mmol/L (ref 20–29)
Calcium: 10 mg/dL (ref 8.7–10.3)
Creatinine, Ser: 0.98 mg/dL (ref 0.57–1.00)
GFR, EST AFRICAN AMERICAN: 62 mL/min/{1.73_m2} (ref >59)
GFR, EST NON AFRICAN AMERICAN: 54 mL/min/{1.73_m2} — AB (ref >59)
GLUCOSE: 91 mg/dL (ref 65–99)
Potassium: 5.1 mmol/L (ref 3.5–5.2)
Sodium: 137 mmol/L (ref 134–144)

## 2017-02-16 LAB — URINALYSIS, ROUTINE W REFLEX MICROSCOPIC
BILIRUBIN UA: NEGATIVE
Glucose, UA: NEGATIVE
KETONES UA: NEGATIVE
Nitrite, UA: NEGATIVE
PH UA: 6.5 (ref 5.0–7.5)
PROTEIN UA: NEGATIVE
RBC UA: NEGATIVE
Specific Gravity, UA: 1.02 (ref 1.005–1.030)
Urobilinogen, Ur: 0.2 mg/dL (ref 0.2–1.0)

## 2017-02-16 LAB — CBC
HEMATOCRIT: 41.4 % (ref 34.0–46.6)
HEMOGLOBIN: 13.8 g/dL (ref 11.1–15.9)
MCH: 32.2 pg (ref 26.6–33.0)
MCHC: 33.3 g/dL (ref 31.5–35.7)
MCV: 97 fL (ref 79–97)
Platelets: 225 10*3/uL (ref 150–379)
RBC: 4.29 x10E6/uL (ref 3.77–5.28)
RDW: 13.5 % (ref 12.3–15.4)
WBC: 5.5 10*3/uL (ref 3.4–10.8)

## 2017-02-16 LAB — MICROSCOPIC EXAMINATION
CASTS: NONE SEEN /LPF
WBC, UA: >30 /hpf — AB (ref 0–?)

## 2017-02-20 DIAGNOSIS — B961 Klebsiella pneumoniae [K. pneumoniae] as the cause of diseases classified elsewhere: Secondary | ICD-10-CM

## 2017-02-20 DIAGNOSIS — I959 Hypotension, unspecified: Secondary | ICD-10-CM | POA: Insufficient documentation

## 2017-02-20 DIAGNOSIS — N390 Urinary tract infection, site not specified: Secondary | ICD-10-CM | POA: Insufficient documentation

## 2017-02-20 DIAGNOSIS — B9689 Other specified bacterial agents as the cause of diseases classified elsewhere: Secondary | ICD-10-CM | POA: Insufficient documentation

## 2017-02-20 NOTE — Assessment & Plan Note (Signed)
Blood pressure is low today and she describes symptoms of dizziness with standing. She has started standing up more slowly than waiting for the dizziness to resolve. I suspect this hypotension is in the setting of acute illness.  -Hold irbesartan for 1 week

## 2017-02-20 NOTE — Assessment & Plan Note (Addendum)
Patient with history of overactive bladder presents with days of increased urinary frequency associated with dysuria, and nausea. The symptoms started days ago and when her son called to check on her yesterday he noticed that she sounded more confused. Her son went to see her and noticed that her gait seemed unsteady. She denies fever, chills, vomiting, abdominal pain, or flank pain. Urinalysis today shows moderate leukocytes, moderate bacteria with 0-10 squamous epithelial cells and nitrite negative. Her story is concerning for a lower urinary tract infection. -Prescribed Bactrim 160-800 twice a day for 3 days - urinalysis, urine culture >> gram negative rods, greater than 10,000 colonies  - BMP and CBC today - not consistent with systemic infection or dehydration, kidney function intact

## 2017-02-21 LAB — URINE CULTURE

## 2017-02-21 NOTE — Progress Notes (Signed)
Internal Medicine Clinic Attending  Case discussed with Dr. Blum at the time of the visit.  We reviewed the resident's history and exam and pertinent patient test results.  I agree with the assessment, diagnosis, and plan of care documented in the resident's note. 

## 2017-02-27 ENCOUNTER — Other Ambulatory Visit: Payer: Self-pay | Admitting: Internal Medicine

## 2017-03-07 ENCOUNTER — Encounter: Payer: Self-pay | Admitting: Internal Medicine

## 2017-03-07 ENCOUNTER — Ambulatory Visit (INDEPENDENT_AMBULATORY_CARE_PROVIDER_SITE_OTHER): Payer: Medicare Other | Admitting: Internal Medicine

## 2017-03-07 DIAGNOSIS — R627 Adult failure to thrive: Secondary | ICD-10-CM | POA: Diagnosis not present

## 2017-03-07 DIAGNOSIS — I959 Hypotension, unspecified: Secondary | ICD-10-CM

## 2017-03-07 DIAGNOSIS — Z87891 Personal history of nicotine dependence: Secondary | ICD-10-CM | POA: Diagnosis not present

## 2017-03-07 DIAGNOSIS — Z8052 Family history of malignant neoplasm of bladder: Secondary | ICD-10-CM

## 2017-03-07 DIAGNOSIS — R42 Dizziness and giddiness: Secondary | ICD-10-CM

## 2017-03-07 DIAGNOSIS — I10 Essential (primary) hypertension: Secondary | ICD-10-CM

## 2017-03-07 DIAGNOSIS — Z6820 Body mass index (BMI) 20.0-20.9, adult: Secondary | ICD-10-CM

## 2017-03-07 DIAGNOSIS — B961 Klebsiella pneumoniae [K. pneumoniae] as the cause of diseases classified elsewhere: Secondary | ICD-10-CM

## 2017-03-07 DIAGNOSIS — Z79899 Other long term (current) drug therapy: Secondary | ICD-10-CM | POA: Diagnosis not present

## 2017-03-07 DIAGNOSIS — N390 Urinary tract infection, site not specified: Secondary | ICD-10-CM

## 2017-03-07 NOTE — Progress Notes (Signed)
   CC: Weight loss follow up  HPI:  [name] is a 83 y.o. female with past medical history outlined below here for follow up of her weight loss. For the details of today's visit, please refer to the assessment and plan.  Past Medical History:  Diagnosis Date  . Arthritis 11/2013.    "both knees" (11/26/2013)  . Asthma    "only when I smoked"  . Barrett's esophagus since at least 1985  . CKD (chronic kidney disease)    GFR 55 11/2014   . COPD (chronic obstructive pulmonary disease) ([place])   . Depression with anxiety 11/2013   "little bit"   . Diverticulosis of colon 1996  . GAVE (gastric antral vascular ectasia) 01/16/2015  . Hiatal hernia    s/p gastropexy after failed Nissen fundoplication.   . High cholesterol   . Hx of adenomatous colonic polyps   . Hypertension   . Hypothyroidism   . Pneumonia 11/2013    Review of Systems:  Denies N/V/D. Endorses frequent postural dizziness. No syncope.   Physical Exam:  Vitals:   03/07/17 1324  BP: (!) 141/63  Pulse: 66  Temp: (!) 97.5 F (36.4 C)  TempSrc: Oral  SpO2: 94%  Weight: 139 lb 12.8 oz (63.4 kg)    Constitutional: Thin woman in NAD HEENT: Dentures Cardiovascular: RRR Pulmonary/Chest: CTAB Extremities: Warm and well perfused.  No edema.  Skin: No rashes or erythema  Psychiatric: Normal mood and affect  Assessment & Plan:   See Encounters Tab for problem based charting.  Patient discussed with Dr. Angelia Mould mc

## 2017-03-07 NOTE — Patient Instructions (Signed)
Carolyn [name],  It was a pleasure to see you today. Please continue to take all of your mediations as previously prescribed. I will touch base with your daughter regarding your medications. I have ordered a DEXA scan (aka bone scan) to check your bone strength. You will be called to schedule this study. Please return to clinic in 3 months for follow up or sooner if you need [place]. If you have any questions or concerns, call our clinic at [phone] or after hours call [phone] and ask for the internal medicine resident on call. Thank you!  - Dr. Philipp Ovens

## 2017-03-08 ENCOUNTER — Other Ambulatory Visit: Payer: Self-pay | Admitting: *Deleted

## 2017-03-08 DIAGNOSIS — S72001S Fracture of unspecified part of neck of right femur, sequela: Secondary | ICD-10-CM

## 2017-03-08 DIAGNOSIS — M810 Age-related osteoporosis without current pathological fracture: Secondary | ICD-10-CM

## 2017-03-08 NOTE — Assessment & Plan Note (Addendum)
Patient was seen in clinic 2 weeks ago for urinary frequency worse than her baseline (overactive bladder) and AMS. Urinalysis was consistent with UTI and she was prescribed a 3 day course of bactrim. Urine cultures later grew > 100,000 colonies of Klebsiella pneumoniae (resistant to amp, sensitive to cephalosporins and bactrim). She reports her urinary frequency is now back to baseline and son reports her AMS has resolved. She denies any fevers, flank pain, nausea, or vomiting.   -- Resolved with bactrim (susceptible per sensitives)

## 2017-03-09 ENCOUNTER — Other Ambulatory Visit: Payer: Self-pay | Admitting: *Deleted

## 2017-03-09 DIAGNOSIS — N390 Urinary tract infection, site not specified: Secondary | ICD-10-CM

## 2017-03-09 NOTE — Assessment & Plan Note (Addendum)
Patient is here today for weight loss follow up. She has lost approximately 32 lbs over the past year and a half without clear etiology. She was last seen for this issue three months ago at which point her weight was 143 lbs. Today she is down to 139 lbs. At that visit, there was some concern that Alendronate may be contributing and she was instructed to hold this medication. Patient was hospitalized for a right hip fracture in June of 2017 and subsequently started on alendronate therapy, around the time she began to lose weight. She has never had a DEXA scan. Plan was to obtain DEXA to reassess fracture risk and arrange for other non bisphosphonate therapies if indicated. Unfortunately scan was not obtained for unknown reasons. She has not taken alendronate since that visit but unfortunately has continued to lose weight. She is up to date with age appropriate cancer screenings. She endorses an overall loss of appetite and poor po intake. Son reports she normally eats 1-2 small meals a day, but she is compliant with her BID ensure supplements. Patient wears dentures but does not think these are limiting her ability to eat. Given her on going failure to thrive, moving forward it is likely wise to limit additional medications and eliminate medications if possible. We will continue holding alendronate and obtain DEXA. Encouraged patient to eat three meals a day and continue ensure supplements.  -- Continue holding alendronate -- F/u DEXA -- Encourage PO intake -- Ensure BID -- F/u PCP 3 months

## 2017-03-09 NOTE — Progress Notes (Signed)
Internal Medicine Clinic Attending  Case discussed with Dr. Guilloud at the time of the visit.  We reviewed the resident's history and exam and pertinent patient test results.  I agree with the assessment, diagnosis, and plan of care documented in the resident's note.  

## 2017-03-09 NOTE — Assessment & Plan Note (Addendum)
Patient was instructed to hold her irbesartan for 1 week in the setting of dizziness and hypotension associated with her UTI. Patient has resumed her irbesartan 150 mg daily since completing antibiotic therapy. She is also taking metoprolol 25 mg daily. BP today is improved 141/63, however she reports on-going symptoms of intermittent postural dizziness. She reports this has been going on for awhile, prior to her UTI. I suspect dizziness is related to poor po intake in the setting of her failure to thrive and unlikely related to on-going infection.  -- Continue Metoprolol 25 mg daily -- Continue Irbesartan 150 mg daily -- Encouraged PO intake

## 2017-03-14 MED ORDER — OXYBUTYNIN CHLORIDE ER 5 MG PO TB24: 5.0000 mg | ORAL_TABLET | Freq: Every day | ORAL | 2 refills | Status: DC

## 2017-03-29 ENCOUNTER — Other Ambulatory Visit: Payer: Self-pay | Admitting: *Deleted

## 2017-03-29 MED ORDER — IRBESARTAN 150 MG PO TABS: 150.0000 mg | ORAL_TABLET | Freq: Every day | ORAL | 2 refills | Status: DC

## 2017-04-17 ENCOUNTER — Other Ambulatory Visit: Payer: Self-pay | Admitting: Internal Medicine

## 2017-04-17 DIAGNOSIS — M8000XD Age-related osteoporosis with current pathological fracture, unspecified site, subsequent encounter for fracture with routine healing: Secondary | ICD-10-CM

## 2017-04-17 DIAGNOSIS — Z780 Asymptomatic menopausal state: Secondary | ICD-10-CM

## 2017-04-17 DIAGNOSIS — S72001S Fracture of unspecified part of neck of right femur, sequela: Secondary | ICD-10-CM

## 2017-04-17 DIAGNOSIS — M810 Age-related osteoporosis without current pathological fracture: Secondary | ICD-10-CM | POA: Insufficient documentation

## 2017-04-17 NOTE — Progress Notes (Unsigned)
Placed new DEXA order with appropriate diagnosis code.

## 2017-04-25 NOTE — Progress Notes (Signed)
Dx codes unacceptable (for pt's insurance) dx code changed.Despina Hidden Cassady10/2/20184:24 PM

## 2017-04-26 ENCOUNTER — Other Ambulatory Visit: Payer: Self-pay | Admitting: Internal Medicine

## 2017-04-26 DIAGNOSIS — E2839 Other primary ovarian failure: Secondary | ICD-10-CM

## 2017-04-26 DIAGNOSIS — S72001S Fracture of unspecified part of neck of right femur, sequela: Secondary | ICD-10-CM

## 2017-05-16 ENCOUNTER — Ambulatory Visit
Admission: RE | Admit: 2017-05-16 | Discharge: 2017-05-16 | Disposition: A | Discharge status: Home / Self Care | Payer: Medicare Other | Source: Ambulatory Visit | Attending: Internal Medicine | Admitting: Internal Medicine

## 2017-05-16 DIAGNOSIS — Z780 Asymptomatic menopausal state: Secondary | ICD-10-CM | POA: Diagnosis not present

## 2017-05-16 DIAGNOSIS — S72001S Fracture of unspecified part of neck of right femur, sequela: Secondary | ICD-10-CM

## 2017-05-16 DIAGNOSIS — M810 Age-related osteoporosis without current pathological fracture: Secondary | ICD-10-CM | POA: Diagnosis not present

## 2017-05-16 DIAGNOSIS — E2839 Other primary ovarian failure: Secondary | ICD-10-CM

## 2017-06-06 ENCOUNTER — Ambulatory Visit (INDEPENDENT_AMBULATORY_CARE_PROVIDER_SITE_OTHER): Payer: Medicare Other | Admitting: Internal Medicine

## 2017-06-06 ENCOUNTER — Encounter: Payer: Self-pay | Admitting: Internal Medicine

## 2017-06-06 ENCOUNTER — Other Ambulatory Visit: Payer: Self-pay

## 2017-06-06 VITALS — BP 114/67 | HR 96 | Temp 98.2°F | Ht 68.5 in | Wt 145.9 lb

## 2017-06-06 DIAGNOSIS — K2270 Barrett's esophagus without dysplasia: Secondary | ICD-10-CM

## 2017-06-06 DIAGNOSIS — R634 Abnormal weight loss: Secondary | ICD-10-CM | POA: Diagnosis not present

## 2017-06-06 DIAGNOSIS — Z79899 Other long term (current) drug therapy: Secondary | ICD-10-CM

## 2017-06-06 DIAGNOSIS — Z7983 Long term (current) use of bisphosphonates: Secondary | ICD-10-CM

## 2017-06-06 DIAGNOSIS — Z87891 Personal history of nicotine dependence: Secondary | ICD-10-CM | POA: Diagnosis not present

## 2017-06-06 DIAGNOSIS — Z6821 Body mass index (BMI) 21.0-21.9, adult: Secondary | ICD-10-CM

## 2017-06-06 DIAGNOSIS — E039 Hypothyroidism, unspecified: Secondary | ICD-10-CM | POA: Diagnosis not present

## 2017-06-06 DIAGNOSIS — I10 Essential (primary) hypertension: Secondary | ICD-10-CM

## 2017-06-06 DIAGNOSIS — Z23 Encounter for immunization: Secondary | ICD-10-CM | POA: Diagnosis not present

## 2017-06-06 DIAGNOSIS — N390 Urinary tract infection, site not specified: Secondary | ICD-10-CM

## 2017-06-06 MED ORDER — VITAMIN D3 10 MCG (400 UNIT) PO TABS: 800.0000 [IU] | ORAL_TABLET | Freq: Every day | ORAL | 11 refills | Status: AC

## 2017-06-06 MED ORDER — ESTROGENS, CONJUGATED 0.625 MG/GM VA CREA: 1.0000 | TOPICAL_CREAM | Freq: Every day | VAGINAL | 12 refills | Status: DC

## 2017-06-06 MED ORDER — LEVOTHYROXINE SODIUM 100 MCG PO TABS: ORAL_TABLET | ORAL | 1 refills | Status: DC

## 2017-06-06 MED ORDER — METOPROLOL SUCCINATE ER 25 MG PO TB24: 25.0000 mg | ORAL_TABLET | Freq: Every day | ORAL | 3 refills | Status: DC

## 2017-06-06 MED ORDER — IRBESARTAN 150 MG PO TABS: 150.0000 mg | ORAL_TABLET | Freq: Every day | ORAL | 2 refills | Status: DC

## 2017-06-06 MED ORDER — OXYBUTYNIN CHLORIDE ER 5 MG PO TB24: 5.0000 mg | ORAL_TABLET | Freq: Every day | ORAL | 2 refills | Status: DC

## 2017-06-06 NOTE — Progress Notes (Signed)
   CC: follow-up of weight loss, HTN, hypothyroidism.  HPI:  [name] is a 83 y.o.   Weight loss: Weight 170 lbs 09/24/15 and son complained of wt loss at her visit 3/18 at which time her weight was 142. Complains of decreased appetite and early fullness. Does have hx of barrets esophagus however historically has refused further EGDs and further screening. Was started on Ensure BID at initial visit with subsequent 2 lb weight gain. She had been on Alendronate therapy starting 12/2015 which was held as some case reports of elderly on alendronate therapy experiencing weight loss, especially in those with concurrent hypothyroidism. Weight up several pounds today. Patient notes increased appetite and increased energy  HTN: On Irbesartan 150 mg daily, toprol xl 25 mg daily  HLD: On Simvastatin 10 mg daily  Past Medical History:  Diagnosis Date  . Arthritis 11/2013.    "both knees" (11/26/2013)  . Asthma    "only when I smoked"  . Barrett's esophagus since at least 1985  . CKD (chronic kidney disease)    GFR 55 11/2014   . COPD (chronic obstructive pulmonary disease) ([place])   . Depression with anxiety 11/2013   "little bit"   . Diverticulosis of colon 1996  . GAVE (gastric antral vascular ectasia) 01/16/2015  . Hiatal hernia    s/p gastropexy after failed Nissen fundoplication.   . High cholesterol   . Hx of adenomatous colonic polyps   . Hypertension   . Hypothyroidism   . Pneumonia 11/2013   Review of Systems:   General: Denies fevers, chills, weight loss HEENT: Denies changes in vision, sore throat Cardiac: Denies CP, SOB, palpitations Pulmonary: Denies cough, wheezes, PND Abd: Denies diarrhea, changes in bowels Extremities: Denies weakness or swelling  Physical Exam: General: Alert, in no acute distress. Pleasant and conversant. Son present.  HEENT: No icterus, injection or ptosis. No hoarseness or dysarthria  Cardiac: RRR, no MGR appreciated Pulmonary: CTA BL with normal  WOB on RA. Able to speak in complete sentences Abd: Soft, non-tended. +bs Extremities: Warm, perfused. No significant pedal edema.   Vitals:   06/06/17 1429  BP: 114/67  Pulse: 96  Temp: 98.2 F (36.8 C)  TempSrc: Oral  SpO2: 96%  Weight: 145 lb 14.4 oz (66.2 kg)  Height: 5' 8.5" (1.74 m)   Assessment & Plan:   See Encounters Tab for problem based charting.  Patient discussed with Dr. Evette Doffing

## 2017-06-06 NOTE — Assessment & Plan Note (Addendum)
Has gained several pounds since her recent clinic visit and has improved appetite! She notes she has been supplementing her diet with Boost or Ensure shakes twice daily. Feels that her energy has improved as well and son notices a difference.  Initial work-up for her weight loss was unrevealing, including normal tsh. She does have hx of barrets esophagus and endorsed early satiety however does not want repeat EGD. She denies any changes in her bowels.  -Continue nutritional shake supplementation 2-3x per day in addition to several nutritious meals -If weight loss returns, or if patient/family interested, could consider full body CT in search of malignancy

## 2017-06-06 NOTE — Assessment & Plan Note (Signed)
Most recent TSH 1.6 10/2016. Please repeat this at next clinic visit.

## 2017-06-06 NOTE — Assessment & Plan Note (Signed)
BP well controlled and denies any dizziness. Continue Irbesartan 150mg  daily and Toprol XL 25 mg daily.

## 2017-06-06 NOTE — Patient Instructions (Addendum)
It was a pleasure seeing you today! I'm glad you are doing well and feeling well.   You have gained 7 lbs since your last visit! Keep up the great work! Please continue drinking Boost or Ensure 2 or 3 times per day.   I have sent in a refill on your medications.   Please come back and see me in 6 months, or sooner if needed!

## 2017-06-06 NOTE — Assessment & Plan Note (Signed)
Refill sent of topical estrogen cream. She denies any dysuria.

## 2017-06-06 NOTE — Assessment & Plan Note (Signed)
Received flu shot today. Tolerated well. 

## 2017-06-07 NOTE — Progress Notes (Signed)
Internal Medicine Clinic Attending  Case discussed with Dr. Molt at the time of the visit.  We reviewed the resident's history and exam and pertinent patient test results.  I agree with the assessment, diagnosis, and plan of care documented in the resident's note. 

## 2017-06-25 ENCOUNTER — Other Ambulatory Visit: Payer: Self-pay | Admitting: Internal Medicine

## 2017-07-04 ENCOUNTER — Ambulatory Visit: Payer: Medicare Other | Admitting: Internal Medicine

## 2017-08-16 ENCOUNTER — Other Ambulatory Visit: Payer: Self-pay | Admitting: *Deleted

## 2017-08-16 NOTE — Progress Notes (Signed)
Received refill request from pt pharmacy-will send to pcp for review.Despina Hidden Cassady1/23/201911:25 AM   Simvastatin no longer on medication list, but pt has an appt tomorrow tomorrow 1/30 with her pcp.Despina Hidden Cassady1/29/20194:18 PM

## 2017-08-23 ENCOUNTER — Encounter: Payer: Self-pay | Admitting: Internal Medicine

## 2017-08-23 ENCOUNTER — Ambulatory Visit (INDEPENDENT_AMBULATORY_CARE_PROVIDER_SITE_OTHER): Payer: Medicare Other | Admitting: Internal Medicine

## 2017-08-23 ENCOUNTER — Other Ambulatory Visit: Payer: Self-pay

## 2017-08-23 ENCOUNTER — Telehealth: Payer: Self-pay | Admitting: *Deleted

## 2017-08-23 VITALS — BP 154/72 | HR 71 | Temp 97.8°F | Ht 68.5 in | Wt 141.7 lb

## 2017-08-23 DIAGNOSIS — Z0000 Encounter for general adult medical examination without abnormal findings: Secondary | ICD-10-CM

## 2017-08-23 NOTE — Telephone Encounter (Signed)
[facility] called to request refills for simvastatin, looks like you stopped it on 06/06/17 Please advise

## 2017-08-23 NOTE — Progress Notes (Signed)
   Subjective:   Carolyn [name] is a 84 y.o. female who presents for a Medicare Annual Wellness Visit.  The following items have been reviewed and updated today in the appropriate area in the EMR.   Health Risk Assessment  Height, weight, BMI, and BP Depression screen Fall risk / safety level Medical and family history were reviewed and updated Updating list of other providers & suppliers Medication reconciliation, including over the counter medicines Cognitive screen Written screening schedule Risk Factor list Personalized health advice, risky behaviors, and treatment advice       Objective:    Vitals: BP (!) 154/72 (BP Location: Right Arm, Patient Position: Sitting, Cuff Size: Small)   Pulse 71   Temp 97.8 F (36.6 C) (Oral)   Ht 5' 8.5" (1.74 m)   Wt 141 lb 11.2 oz (64.3 kg)   SpO2 93%   BMI 21.23 kg/m   Activities of Daily Living In your present state of health, do you have any difficulty performing the following activities: 08/23/2017 06/06/2017  Hearing? N N  Vision? N N  Difficulty concentrating or making decisions? N N  Comment - -  Walking or climbing stairs? N Y  Dressing or bathing? N N  Doing errands, shopping? N N  Comment - -  Some recent data might be hidden   Goals Goals    None     Fall Risk Fall Risk  08/23/2017 06/06/2017 03/07/2017 02/15/2017 12/08/2016  Falls in the past year? No Yes No No No  Number falls in past yr: - 1 - - -  Injury with Fall? - Yes - - -  Comment - - - - -  Risk Factor Category  - - - - -  Risk for fall due to : - Impaired balance/gait - - Impaired mobility;Impaired balance/gait  Follow up - - - - -   Depression Screen PHQ 2/9 Scores 08/23/2017 06/06/2017 03/07/2017 02/15/2017  PHQ - 2 Score 0 0 0 1  PHQ- 9 Score - - - -    Cognitive Testing I assessed the patient for cognitive issues using MoCA and the patient does not have issues with his / her cognition.    Assessment and Plan:    During the course of the  visit the patient was educated and counseled about appropriate screening and preventive services as documented in the assessment and plan.  The printed AVS was given to the patient and included an updated screening schedule, a list of risk factors, and personalized health advice.    Powell Halbert, DO  08/23/2017

## 2017-08-23 NOTE — Patient Instructions (Signed)
Annual Wellness Visit   Medicare Covered Preventative Screenings and Services  Services & Screenings Men and Women Who How Often Need? Date of Last Service Action  Abdominal Aortic Aneurysm Adults with AAA risk factors Once No    Alcohol Misuse and Counseling All Adults Screening once a year if no alcohol misuse. Counseling up to 4 face to face sessions. No    Bone Density Measurement  Adults at risk for osteoporosis Once every 2 yrs No 2018 No osteoporosis, continue Calcium and Vitamin D Supplements  Lipid Panel Z13.6 All adults without CV disease Once every 5 yrs No 2017 Normal! Continue current diet  Colorectal Cancer   Stool sample or  Colonoscopy All adults 63 and older   Once every year  Every 10 years No 2016 No further colonscopy needed  Depression All Adults Once a year No Today Doing well  Diabetes Screening Blood glucose, post glucose load, or GTT Z13.1  All adults at risk  Pre-diabetics  Once per year  Twice per year No 2018 No diabetes                          HIV Z11.4 All adults based on risk  Annually btw ages 52 & 63 regardless of risk  Annually > 65 yrs if at increased risk No 2016 Negative for HIV  Lung Cancer Screening Asymptomatic adults aged 71-77 with 84 pack yr history and current smoker OR quit within the last 15 yrs Annually Must have counseling and shared decision making documentation before first screen No 2016 Chest x ray negative          Obesity and Counseling All adults Screening once a year Counseling if BMI 30 or higher  Today Great weight!  Tobacco Use Counseling Adults who use tobacco  Up to 8 visits in one year No  Not applicable  Vaccines N23  Hepatitis B  Influenza   Pneumonia  Adults   Once  Once every flu season  Two different vaccines separated by one year  2018   Next Annual Wellness Visit People with Medicare Every year  Today Next year    Things That May Be Affecting Your Health:  Alcohol  Hearing loss  Pain     Depression  Home Safety  Sexual Health   Diabetes  Lack of physical activity  Stress   Difficulty with daily activities  Loneliness  Tiredness   Drug use  Medicines  Tobacco use  x Falls  Motor Vehicle Safety  Weight   Food choices  Oral Health  Other    YOUR PERSONALIZED HEALTH PLAN : 1. Schedule your next subsequent Medicare Wellness visit in one year 2. Attend all of your regular appointments to address your medical issues 3. Complete the preventative screenings and services 4. You are up to date on all of your health care screens! I am glad you are doing so well!

## 2017-08-24 ENCOUNTER — Encounter: Payer: Self-pay | Admitting: Internal Medicine

## 2017-08-24 DIAGNOSIS — Z0000 Encounter for general adult medical examination without abnormal findings: Secondary | ICD-10-CM | POA: Insufficient documentation

## 2017-08-24 NOTE — Assessment & Plan Note (Signed)
Reviewed appropriate screens and healthcare maintenance screens. Patient lives at home with her son and small dog Personal assistant). She feels safe at home and is without falls. Ambulates with cane. Son and daughter either cook/bring meals. She spends her days watching her favorite shows on TV and spending time with family. She feels well and has had no changes in medications. She has not been hospitalized recently. We reviewed her medications, health history and family history. We completed cognitive and depression screens.

## 2017-08-25 NOTE — Progress Notes (Signed)
Internal Medicine Clinic Attending  Case discussed with Dr. Molt at the time of the visit.  We reviewed the resident's history and exam and pertinent patient test results.  I agree with the assessment, diagnosis, and plan of care documented in the resident's note. 

## 2017-09-04 NOTE — Progress Notes (Signed)
Per last visit, lipids normal continue diet control

## 2017-10-01 ENCOUNTER — Other Ambulatory Visit: Payer: Self-pay | Admitting: Internal Medicine

## 2017-11-06 ENCOUNTER — Telehealth: Payer: Self-pay | Admitting: *Deleted

## 2017-11-06 NOTE — Telephone Encounter (Signed)
Received fax from [facility] requesting 90 day supply of zocor. Per OV note on 06/06/2017 patient on simvastatin 10 mg daily, however, not listed on current med list. Hubbard Hartshorn, RN, BSN

## 2017-11-08 NOTE — Telephone Encounter (Signed)
This medication was discontinued many months ago, shortly after that visit. She does not need refill.

## 2017-11-08 NOTE — Telephone Encounter (Signed)
Call made to [facility]-simvastatin refill request denied-pt no longer taking. Attempted to contact patient to confirm that she is no longer taking medication , no answer-HIPPA compliant message left on recorder to contact office regarding a refill request.[name], Carolyn Cassady4/17/20191:45 PM

## 2017-11-14 ENCOUNTER — Telehealth: Payer: Self-pay | Admitting: Internal Medicine

## 2017-11-14 NOTE — Telephone Encounter (Signed)
Patient daughter is requesting refill on cholestrol medicine, the request was faxed over per [facility] mail service

## 2017-11-14 NOTE — Telephone Encounter (Signed)
Returned call to daughter. Made aware that simvastatin d/c on 06/06/2017. Patient continues to take fish oil. Daughter requesting that she be notified of all med changes as she is the one who fixes pill box for patient. Hubbard Hartshorn, RN, BSN

## 2017-11-14 NOTE — Telephone Encounter (Signed)
Thank you! Will keep that in mind. Son is always with pt at visits, will inform daughter as well.

## 2017-11-14 NOTE — Telephone Encounter (Signed)
Daughter Francis Dowse made aware that simvastatin was d/c on 06/06/2017. Hubbard Hartshorn, RN, BSN

## 2017-12-21 ENCOUNTER — Other Ambulatory Visit: Payer: Self-pay | Admitting: Internal Medicine

## 2018-01-07 ENCOUNTER — Other Ambulatory Visit: Payer: Self-pay | Admitting: Internal Medicine

## 2018-01-31 ENCOUNTER — Other Ambulatory Visit: Payer: Self-pay

## 2018-01-31 MED ORDER — GABAPENTIN 300 MG PO CAPS: 600.0000 mg | ORAL_CAPSULE | Freq: Every day | ORAL | 2 refills | Status: DC

## 2018-01-31 NOTE — Telephone Encounter (Signed)
gabapentin (NEURONTIN) 300 MG capsule, Refill request @ [facility].

## 2018-02-20 ENCOUNTER — Other Ambulatory Visit: Payer: Self-pay

## 2018-02-20 ENCOUNTER — Ambulatory Visit (INDEPENDENT_AMBULATORY_CARE_PROVIDER_SITE_OTHER): Payer: Medicare Other | Admitting: Internal Medicine

## 2018-02-20 ENCOUNTER — Encounter: Payer: Self-pay | Admitting: Internal Medicine

## 2018-02-20 VITALS — BP 130/78 | HR 64 | Temp 98.1°F | Wt 135.8 lb

## 2018-02-20 DIAGNOSIS — N3281 Overactive bladder: Secondary | ICD-10-CM | POA: Diagnosis not present

## 2018-02-20 DIAGNOSIS — M545 Low back pain, unspecified: Secondary | ICD-10-CM

## 2018-02-20 DIAGNOSIS — I10 Essential (primary) hypertension: Secondary | ICD-10-CM | POA: Diagnosis not present

## 2018-02-20 DIAGNOSIS — G8929 Other chronic pain: Secondary | ICD-10-CM | POA: Diagnosis not present

## 2018-02-20 DIAGNOSIS — Z6820 Body mass index (BMI) 20.0-20.9, adult: Secondary | ICD-10-CM

## 2018-02-20 DIAGNOSIS — R634 Abnormal weight loss: Secondary | ICD-10-CM

## 2018-02-20 DIAGNOSIS — Z791 Long term (current) use of non-steroidal anti-inflammatories (NSAID): Secondary | ICD-10-CM

## 2018-02-20 DIAGNOSIS — F0390 Unspecified dementia without behavioral disturbance: Secondary | ICD-10-CM

## 2018-02-20 DIAGNOSIS — R413 Other amnesia: Secondary | ICD-10-CM | POA: Diagnosis not present

## 2018-02-20 DIAGNOSIS — R5383 Other fatigue: Secondary | ICD-10-CM

## 2018-02-20 DIAGNOSIS — Z79899 Other long term (current) drug therapy: Secondary | ICD-10-CM

## 2018-02-20 MED ORDER — OXYBUTYNIN CHLORIDE ER 5 MG PO TB24: 5.0000 mg | ORAL_TABLET | Freq: Every day | ORAL | 2 refills | Status: DC

## 2018-02-20 MED ORDER — MELOXICAM 15 MG PO TABS
7.5000 mg | ORAL_TABLET | Freq: Every day | ORAL | 1 refills | Status: DC
Start: 2018-02-20 — End: 2018-04-24

## 2018-02-20 MED ORDER — DULOXETINE HCL 30 MG PO CPEP: 30.0000 mg | ORAL_CAPSULE | Freq: Every day | ORAL | 2 refills | Status: DC

## 2018-02-20 MED ORDER — MEMANTINE HCL 5 MG PO TABS
5.0000 mg | ORAL_TABLET | Freq: Every day | ORAL | 2 refills | Status: DC
Start: 2018-02-20 — End: 2018-04-24

## 2018-02-20 NOTE — Patient Instructions (Addendum)
It was great seeing you both today!   Today we talked about: #1) Pain: I'm sorry you are having bad back pain. Today we discussed several options for pain control. Unfortunately a lot of the pain medications (like Tramadol) have side effects including trouble with memory, falls and drowsiness. This makes them dangerous to use.  We discussed a few treatment options and ultimately decided on: -Starting Duloxetine 30mg  daily. This is a medication that works for chronic pain but unfortunately takes several weeks for it to reach the right level in your blood. Please take this medication once daily. This should also help with mood and appetite -Starting Meloxicam 7.5mg  as needed. This is a strong anti-inflammatory medication and should NOT be taken with Ibuprofen or Naproxen. You can take this twice daily as needed. You can try 15mg  daily if needed.   #2) Urinary issues: I've provided a refill of your Oxybutinin.   #3) Memory trouble: We talked about difficulties with memory today. A test we did showed you do have Dementia. Unfortunately not much can be done to reverse it, however medications can hopefully slow its progression. I've started a new medicine today called Memantine which will be taken once daily.  -Please take Memantine once daily -Please work on brain puzzles like crosswords and word searches -You can try Fish Oil supplements too to see if this helps.    Please stop by the lab on your way out so I can check some blood work.   I'll see you back in 6-8 weeks to see how you are doing!

## 2018-02-21 DIAGNOSIS — M545 Low back pain: Secondary | ICD-10-CM

## 2018-02-21 DIAGNOSIS — G8929 Other chronic pain: Secondary | ICD-10-CM | POA: Insufficient documentation

## 2018-02-21 LAB — BMP8+ANION GAP
Anion Gap: 15 mmol/L (ref 10.0–18.0)
BUN/Creatinine Ratio: 22 (ref 12–28)
BUN: 21 mg/dL (ref 8–27)
CO2: 23 mmol/L (ref 20–29)
Calcium: 10.1 mg/dL (ref 8.7–10.3)
Chloride: 93 mmol/L — ABNORMAL LOW (ref 96–106)
Creatinine, Ser: 0.94 mg/dL (ref 0.57–1.00)
GFR calc Af Amer: 64 mL/min/{1.73_m2} (ref >59)
GFR calc non Af Amer: 56 mL/min/{1.73_m2} — ABNORMAL LOW (ref >59)
Glucose: 84 mg/dL (ref 65–99)
Potassium: 5.1 mmol/L (ref 3.5–5.2)
Sodium: 131 mmol/L — ABNORMAL LOW (ref 134–144)

## 2018-02-21 LAB — VITAMIN B12: Vitamin B-12: >2000 pg/mL — ABNORMAL HIGH (ref 232–1245)

## 2018-02-21 LAB — TSH: TSH: 4.22 u[IU]/mL (ref 0.450–4.500)

## 2018-02-21 LAB — VITAMIN D 25 HYDROXY (VIT D DEFICIENCY, FRACTURES): Vit D, 25-Hydroxy: 46.5 ng/mL (ref 30.0–100.0)

## 2018-02-21 NOTE — Progress Notes (Signed)
   CC: follow-up of urinary frequency, memory trouble  HPI:  [name] is a 84 y.o. F with medical history as noted below who presents today with her son to discuss several concerns including trouble with memory and bothersome LBP.   For details regarding today's visit and the status of their chronic medical issues, please refer to the assessment and plan.  Past Medical History:  Diagnosis Date  . Arthritis 11/2013.    "both knees" (11/26/2013)  . Asthma    "only when I smoked"  . Barrett's esophagus since at least 1985  . CKD (chronic kidney disease)    GFR 55 11/2014   . COPD (chronic obstructive pulmonary disease) ([place])   . Depression with anxiety 11/2013   "little bit"   . Diverticulosis of colon 1996  . Failure to thrive in adult 10/24/2016  . GAVE (gastric antral vascular ectasia) 01/16/2015  . Hiatal hernia    s/p gastropexy after failed Nissen fundoplication.   . High cholesterol   . Hx of adenomatous colonic polyps   . Hypertension   . Hypothyroidism   . Pneumonia 11/2013   Review of Systems:   General: Admits to fatigue and weight loss. Denies fevers, chills HEENT: Denies acute changes in vision, sore throat, dysphagia Cardiac: Denies CP, SOB Pulmonary: Denies cough or wheezing Abd: Denies abdominal pain, changes in bowels Extremities: Denies swelling or increased weakness. Denies falls.  GU: Admits to urinary urgency. Denies frequency, dysuria or hematuria  Physical Exam: General: Frail elderly female sitting in wheelchair. Son present. Appears fatigued.  HEENT: No icterus, injection or ptosis. No hoarseness or dysarthria. No thrush.  Cardiac: RRR, no Murmur Pulmonary: CTA BL with normal WOB on RA. Able to speak in complete sentences Abd: Soft, non-tender. +bs Extremities: Warm, perfused. No significant pedal edema.  Neurologic: No tremor. Maybe mild rigidity of LUE. Facial muscles symmetric and expressive. No dysarthria. Strength appropriate for age. Gait  slow to start but no wide-stance or shuffling. Kyphotic.   Vitals:   02/20/18 1420  BP: 130/78  Pulse: 64  Temp: 98.1 F (36.7 C)  TempSrc: Oral  SpO2: 100%  Weight: 135 lb 12.8 oz (61.6 kg)   Body mass index is 20.35 kg/m.  Assessment & Plan:   See Encounters Tab for problem based charting.  Patient seen with Dr. Evette Doffing

## 2018-02-25 ENCOUNTER — Other Ambulatory Visit: Payer: Self-pay | Admitting: Internal Medicine

## 2018-02-25 DIAGNOSIS — N390 Urinary tract infection, site not specified: Secondary | ICD-10-CM

## 2018-02-25 NOTE — Assessment & Plan Note (Signed)
BP is excellent today at 1300/78 with pulse 64. She is doing well on Irbesartan 150mg  daily and denied dizziness, LH or muscle cramping.

## 2018-02-25 NOTE — Assessment & Plan Note (Signed)
Patient and family request refill of Oxybutynin and feel this makes quite a difference in her quality of life. She denies any dysuria, increased frequency but does admit to her baseline urgency. Refill of Oxybutynin sent to pharmacy.

## 2018-02-25 NOTE — Assessment & Plan Note (Signed)
Assessment:  Patient and son both relay a gradual but steady decline in memory for >1 year. Patient recently started becoming frustrated with her poor short-term memory and family would like her to be evaluated for dementia. We performed the Mini-cog with score <3. Exam was without tremor, rigidity, or wide-based gait to suggest PD. She has no hallucinations or behavioral disturbances to suggest LBD or FTD.     Plan: Suspect memory difficulties related to advanced age however will ensure renal function/lytes, TSH, Vitamin D and B12 are wnl. Discussed that we cannot reverse her memory issues but can try a medication to help slow progression for which they were agreeable.  -Start namenda 5mg  daily -Check TSH, B12, Vitamin D, BMET -Encouraged patient to perform puzzles/word-search, etc.  -Will see patient back in 6-weeks to see how she is doing with this new medication

## 2018-02-25 NOTE — Assessment & Plan Note (Signed)
Assessment:  Mrs. Canevari today reports frustration over her chronic LBP which she feels has been dismissed by providers in the past due to her age and expected age-related osteoarthritis. She and her son feel it is contributing to a lower quality of life and feel that if her pain were better controlled she would have more energy, better appetite and be a little more active at home.      Plan: Discussed different treatment options and we decided on initiating Cymbalta 30mg  daily with Meloxicam to be used prn until Cymbalta effective. I'll see her back in 6 weeks to see how she's doing with this change.

## 2018-02-26 NOTE — Progress Notes (Signed)
Internal Medicine Clinic Attending  I saw and evaluated the patient.  I personally confirmed the key portions of the history and exam documented by Dr. Molt and I reviewed pertinent patient test results.  The assessment, diagnosis, and plan were formulated together and I agree with the documentation in the resident's note. 

## 2018-02-27 ENCOUNTER — Encounter: Payer: Self-pay | Admitting: Internal Medicine

## 2018-04-24 ENCOUNTER — Other Ambulatory Visit: Payer: Self-pay

## 2018-04-24 ENCOUNTER — Ambulatory Visit (INDEPENDENT_AMBULATORY_CARE_PROVIDER_SITE_OTHER): Payer: Medicare Other | Admitting: Internal Medicine

## 2018-04-24 ENCOUNTER — Encounter: Payer: Self-pay | Admitting: Internal Medicine

## 2018-04-24 VITALS — BP 165/100 | HR 62 | Temp 97.6°F | Wt 129.7 lb

## 2018-04-24 DIAGNOSIS — Z23 Encounter for immunization: Secondary | ICD-10-CM

## 2018-04-24 DIAGNOSIS — I10 Essential (primary) hypertension: Secondary | ICD-10-CM

## 2018-04-24 DIAGNOSIS — M545 Low back pain, unspecified: Secondary | ICD-10-CM

## 2018-04-24 DIAGNOSIS — F0390 Unspecified dementia without behavioral disturbance: Secondary | ICD-10-CM

## 2018-04-24 DIAGNOSIS — Z8719 Personal history of other diseases of the digestive system: Secondary | ICD-10-CM

## 2018-04-24 DIAGNOSIS — E039 Hypothyroidism, unspecified: Secondary | ICD-10-CM

## 2018-04-24 DIAGNOSIS — R634 Abnormal weight loss: Secondary | ICD-10-CM

## 2018-04-24 DIAGNOSIS — R296 Repeated falls: Secondary | ICD-10-CM

## 2018-04-24 DIAGNOSIS — G8929 Other chronic pain: Secondary | ICD-10-CM

## 2018-04-24 DIAGNOSIS — Z79899 Other long term (current) drug therapy: Secondary | ICD-10-CM

## 2018-04-24 DIAGNOSIS — M810 Age-related osteoporosis without current pathological fracture: Secondary | ICD-10-CM | POA: Diagnosis not present

## 2018-04-24 DIAGNOSIS — Z791 Long term (current) use of non-steroidal anti-inflammatories (NSAID): Secondary | ICD-10-CM

## 2018-04-24 DIAGNOSIS — Z87310 Personal history of (healed) osteoporosis fracture: Secondary | ICD-10-CM

## 2018-04-24 DIAGNOSIS — Z681 Body mass index (BMI) 19 or less, adult: Secondary | ICD-10-CM

## 2018-04-24 DIAGNOSIS — Z9181 History of falling: Secondary | ICD-10-CM

## 2018-04-24 MED ORDER — MELOXICAM 15 MG PO TABS: 7.5000 mg | ORAL_TABLET | Freq: Every day | ORAL | 1 refills | Status: DC

## 2018-04-24 MED ORDER — IRBESARTAN 150 MG PO TABS: 150.0000 mg | ORAL_TABLET | Freq: Every day | ORAL | 2 refills | Status: DC

## 2018-04-24 MED ORDER — DULOXETINE HCL 30 MG PO CPEP: 30.0000 mg | ORAL_CAPSULE | Freq: Every day | ORAL | 2 refills | Status: DC

## 2018-04-24 NOTE — Patient Instructions (Signed)
It was nice seeing you today. Thank you for choosing [facility] for your Primary Care.   Today we talked about:  1) Back pain. I have sent refills in of Meloxicam and Duloxetine. Please try to use heat and other topical treatments like Biofreeze or Blue Emu.  2) Memory: Please STOP the Namenda. I'm not sure this medicine is providing much benefit.  3) Osteoporosis: Please continue taking Calcium and Vitamin D. I've given you information on an Injectable medicine for osteoporosis. Please read over this information and let me know if you are interested.  4) If you change your mind about physical therapy or home health services, please let me know and I'm happy to get these arranged for you.    FOLLOW-UP INSTRUCTIONS When: 49-months For: blood pressure, back pain, memory, osteoporosis What to bring: medications  Please contact the clinic if you have any problems, or need to be seen sooner.

## 2018-04-24 NOTE — Assessment & Plan Note (Addendum)
Assessment:  Carolyn [name] has advanced osteoporosis with femoral neck T-score of -4 and history of femoral neck fracture. She is adherant to calcium and vitamin D supplementation however unable to tolerate bisphosphonates due to GI upset and reported weight loss. She's had 2 falls since our last visit although has had no further falls since she started ambulating with assistance of a walker.      Plan: She is at high-risk for fall and osteoporotic fracture which would be debilitating in this frail patient. Family is opposed to another trial of bisphosphonates however did discuss injectible medications, namely Denosumab and provided patients son with information on this medication to discuss with family. Son will call if family is agreeable and will work on obtaining insurance authorization if family agreeable. Offered physical therapy but patient not interested. Encouraged her to continue to use the walker to help prevent falls. Continue Vitamin D and Calcium supplementation.

## 2018-04-24 NOTE — Assessment & Plan Note (Signed)
Assessment:  Patient seen 2 months ago with >1 yr history of cognitive decline. Evaluation suggested possible dementia vs pseudodementia related to uncontrolled pain & associated depression and she was started on Cymbalta 30mg , Meloxicam 7.5mg  prn and Namenda 5mg  daily. Son has not noticed improvement in her mental status and it sounds like they are having more "bad days" now. They do feel her pain is better controlled with Cymbalta and Meloxicam. On exam, patient has some mental slowing and is more fatigued than usual.      Plan: I'm pleased she is getting some improvement in her chronic back pain however I'm concerned about her increased fatigue and continued decline in memory. Discussed stopping Namenda today as I'm not sure this is providing much benefit at this point, and could be contributing to her increased fatigue. Cymbalta could also be related, however given her improvement in pain, will continue this for now and see how she feels off of Namenda. I'll see her back in 3 months to see how she's doing. Discussed possibility of [place] aide, RN, PT or even ALF however patient and son not interested at this time.

## 2018-04-24 NOTE — Assessment & Plan Note (Signed)
Assessment:  BP above goal today at 165/100 however patient out of Irbesartan.      Plan: Refill of Irbesartan 150mg  daily sent to pharmacy. Patient to continue Toprol-XL 25mg  daily.

## 2018-04-24 NOTE — Assessment & Plan Note (Signed)
Assessment:  Patients pain has improved somewhat since starting Cymbalta 30mg  and prn Meloxicam. Does continue to have "bad days" and "good days" but overall feel they are starting to balance out.      Plan: Continue Meloxicam and Cymbalta for now. She is more fatigued than usual on exam but will first d/c Namenda to see if this helps. Will follow-up her pain control at our next visit in 2-3 months. She may benefit from palliative care referral however suspect family will be apprehensive. Consider discussing their role in pain management and symptom control at next visit.

## 2018-04-24 NOTE — Assessment & Plan Note (Signed)
Received flu vaccination today. Tolerated well without immediate complications.

## 2018-04-24 NOTE — Assessment & Plan Note (Signed)
Assessment:  Patient continues to lose weight despite 2 supplemental meal shakes per day. Son does report her appetite has improved lately and is having more "good days" than before when it comes to her PO intake. Work-up for weight loss so far without readily identifiable cause however she does have Barrett's esophagus (chooses not to follow with EGDs), dementia and chronic pain which could be contributing.      Plan: Will work on controlling her pain and encouraging PO intake. She declines invasive work-ups like EGDs, colonoscopies or mammograms. Have discussed appetite stimulants with family previously although ultimately decided against this given lack of mortality benefit.

## 2018-04-24 NOTE — Progress Notes (Signed)
   CC: follow-up of dementia, back pain, osteoporosis  HPI:  [name] is a 83 y.o. F with HTN, hypothyroidism, osteoporosis with history of femoral neck fracture, history of barretts esophagus no longer desiring EGDs, chronic LBP and suspected dementia who presents today for follow-up of dementia, chronic back pain and osteoporosis.   For details regarding today's visit and the status of their chronic medical issues, please refer to the assessment and plan.  Past Medical History:  Diagnosis Date  . Arthritis 11/2013.    "both knees" (11/26/2013)  . Asthma    "only when I smoked"  . Barrett's esophagus since at least 1985  . CKD (chronic kidney disease)    GFR 55 11/2014   . COPD (chronic obstructive pulmonary disease) ([place])   . Depression with anxiety 11/2013   "little bit"   . Diverticulosis of colon 1996  . Failure to thrive in adult 10/24/2016  . GAVE (gastric antral vascular ectasia) 01/16/2015  . Hiatal hernia    s/p gastropexy after failed Nissen fundoplication.   . High cholesterol   . Hx of adenomatous colonic polyps   . Hypertension   . Hypothyroidism   . Pneumonia 11/2013   Review of Systems:   General: Admits to fatigue, poor memory, weight loss. Denies fevers, chills Cardiac: Denies CP or palpitations Pulmonary: Denies cough, or dyspnea Abd: Admits to increased appetite. Denies abdominal pain, nausea, vomiting  Extremities: Admits to 2 falls. Denies swelling, numbness or injury  Physical Exam: General: Frail caucasian female in wheelchair. Awake but fatigued, slower than usual to respond to questions however in no acute distress. Pleasant. Son steven present. HEENT: No icterus, injection or ptosis. No hoarseness or dysarthria  Cardiac: RRR, no murmur Pulmonary: CTA BL with normal WOB on RA. Able to speak in complete sentences Abd: Soft, non-tender. +bs Extremities: Thin. Warm, perfused. No significant pedal edema.   Vitals:   04/24/18 1414  BP: (!)  165/100  Pulse: 62  Temp: 97.6 F (36.4 C)  TempSrc: Oral  SpO2: 97%  Weight: 129 lb 11.2 oz (58.8 kg)   Body mass index is 19.43 kg/m.  Assessment & Plan:   See Encounters Tab for problem based charting.  Patient discussed with Dr. Daryll Drown

## 2018-04-25 ENCOUNTER — Encounter (INDEPENDENT_AMBULATORY_CARE_PROVIDER_SITE_OTHER): Payer: Self-pay | Admitting: Orthopaedic Surgery

## 2018-04-25 ENCOUNTER — Ambulatory Visit (INDEPENDENT_AMBULATORY_CARE_PROVIDER_SITE_OTHER): Payer: Medicare Other | Admitting: Orthopaedic Surgery

## 2018-04-25 VITALS — BP 137/81 | HR 70 | Ht 67.0 in | Wt 129.0 lb

## 2018-04-25 DIAGNOSIS — M25562 Pain in left knee: Principal | ICD-10-CM

## 2018-04-25 DIAGNOSIS — G8929 Other chronic pain: Secondary | ICD-10-CM

## 2018-04-25 NOTE — Progress Notes (Signed)
Internal Medicine Clinic Attending  Case discussed with Dr. Molt at the time of the visit.  We reviewed the resident's history and exam and pertinent patient test results.  I agree with the assessment, diagnosis, and plan of care documented in the resident's note. 

## 2018-05-01 ENCOUNTER — Encounter (INDEPENDENT_AMBULATORY_CARE_PROVIDER_SITE_OTHER): Payer: Self-pay | Admitting: Orthopaedic Surgery

## 2018-05-01 DIAGNOSIS — M25562 Pain in left knee: Secondary | ICD-10-CM | POA: Diagnosis not present

## 2018-05-01 DIAGNOSIS — G8929 Other chronic pain: Secondary | ICD-10-CM | POA: Diagnosis not present

## 2018-05-01 MED ORDER — METHYLPREDNISOLONE ACETATE 40 MG/ML IJ SUSP
40.0000 mg | INTRAMUSCULAR | Status: AC | PRN
  Administered 2018-05-01: 40 mg via INTRA_ARTICULAR

## 2018-05-01 MED ORDER — BUPIVACAINE HCL 0.5 % IJ SOLN
3.0000 mL | INTRAMUSCULAR | Status: AC | PRN
  Administered 2018-05-01: 3 mL via INTRA_ARTICULAR

## 2018-05-01 MED ORDER — LIDOCAINE HCL 1 % IJ SOLN
0.5000 mL | INTRAMUSCULAR | Status: AC | PRN
  Administered 2018-05-01: .5 mL

## 2018-05-01 NOTE — Progress Notes (Signed)
Office Visit Note   Patient: Carolyn [name]           Date of Birth: 12/27/1930           MRN: [id number] Visit Date: 04/25/2018              Requested by: Molt, Glade, DO [place], [place] [id number] PCP: Einar Gip, DO   Assessment & Plan: Visit Diagnoses:  1. Chronic pain of left knee     Plan: Left knee injection performed with good relief.  She will follow-up if she has persistent problems.  Fall prevention discussed.  Follow-Up Instructions: No follow-ups on file.   Orders:  Orders Placed This Encounter  Procedures  . Large Joint Inj   No orders of the defined types were placed in this encounter.     Procedures: Large Joint Inj: L knee on 05/01/2018 8:31 AM Indications: joint swelling and pain Details: 22 G 1.5 in needle, anterolateral approach  Arthrogram: No  Medications: 0.5 mL lidocaine 1 %; 3 mL bupivacaine 0.5 %; 40 mg methylPREDNISolone acetate 40 MG/ML Outcome: tolerated well, no immediate complications Procedure, treatment alternatives, risks and benefits explained, specific risks discussed. Consent was given by the patient. Immediately prior to procedure a time out was called to verify the correct patient, procedure, equipment, support staff and site/side marked as required. Patient was prepped and draped in the usual sterile fashion.       Clinical Data: No additional findings.   Subjective: Chief Complaint  Patient presents with  . Left Knee - Pain    HPI 84 year old female seen with left knee pain requesting a cortisone injection in her knee.  She is had previous injection in the past with good relief none in the last 9 months.  Previous history of bilateral femoral neck fractures treated with hemiarthroplasties both doing well.  Dr. Ninfa Linden did right hip and I did her left hip hemiarthroplasty.  Review of Systems 14 point review of systems positive for osteoporosis, previous femoral neck fractures.  Barrett's esophagus.   History of DVT PA-C, hyperlipidemia, weight loss, dementia, chronic back pain and left knee pain.   Objective: Vital Signs: BP 137/81   Pulse 70   Ht 5\' 7"  (1.702 m)   Wt 129 lb (58.5 kg)   BMI 20.20 kg/m   Physical Exam  Constitutional: She is oriented to person, place, and time. She appears well-developed.  HENT:  Head: Normocephalic.  Right Ear: External ear normal.  Left Ear: External ear normal.  Eyes: Pupils are equal, round, and reactive to light.  Neck: No tracheal deviation present. No thyromegaly present.  Cardiovascular: Normal rate.  Pulmonary/Chest: Effort normal.  Abdominal: Soft.  Neurological: She is alert and oriented to person, place, and time.  Skin: Skin is warm and dry.  Psychiatric: She has a normal mood and affect. Her behavior is normal.    Ortho Exam well-healed hip incisions.  She has crepitus with left knee range of motion collateral ligaments are stable no varus or valgus deformity no palpable Baker's cyst.  Distal pulses are palpable.  Normal ankle range of motion.  Minimal trochanteric bursal tenderness. Specialty Comments:  No specialty comments available.  Imaging: No results found.   PMFS History: Patient Active Problem List   Diagnosis Date Noted  . Chronic low back pain 02/21/2018  . Dementia ([place]) 02/20/2018  . Weight loss 06/06/2017  . Need for immunization against influenza 06/06/2017  . Osteoporosis 04/17/2017  . Healthcare  maintenance 02/23/2016  . OAB (overactive bladder) 02/02/2016  . Hyperlipidemia 12/08/2015  . Nonrheumatic aortic valve insufficiency 10/19/2015  . PAC (premature atrial contraction) 09/09/2015  . History of DVT (deep vein thrombosis) 10/02/2014  . Barrett's esophagus   . Fracture of femoral neck, right ([place]) 02/07/2014  . Hypothyroidism 02/07/2014  . Essential hypertension 02/07/2014   Past Medical History:  Diagnosis Date  . Arthritis 11/2013.    "both knees" (11/26/2013)  . Asthma    "only when I  smoked"  . Barrett's esophagus since at least 1985  . CKD (chronic kidney disease)    GFR 55 11/2014   . COPD (chronic obstructive pulmonary disease) ([place])   . Depression with anxiety 11/2013   "little bit"   . Diverticulosis of colon 1996  . Failure to thrive in adult 10/24/2016  . GAVE (gastric antral vascular ectasia) 01/16/2015  . Hiatal hernia    s/p gastropexy after failed Nissen fundoplication.   . High cholesterol   . Hx of adenomatous colonic polyps   . Hypertension   . Hypothyroidism   . Pneumonia 11/2013    Family History  Problem Relation Age of Onset  . Heart disease Mother   . Stomach cancer Mother   . Heart disease Father   . Heart disease Brother   . Bladder Cancer Sister   . Heart disease Brother     Past Surgical History:  Procedure Laterality Date  . ABDOMINAL HYSTERECTOMY    . ANTERIOR APPROACH HEMI HIP ARTHROPLASTY Right 01/15/2016   Procedure: ANTERIOR APPROACH HEMI HIP ARTHROPLASTY;  Surgeon: Mcarthur Rossetti, MD;  Location: [facility];  Service: Orthopedics;  Laterality: Right;  . APPENDECTOMY    . BLADDER SUSPENSION     .  bladder tack x 3.   . CATARACT EXTRACTION W/ INTRAOCULAR LENS  IMPLANT, BILATERAL Bilateral   . CHOLECYSTECTOMY    . COLONOSCOPY N/A 01/16/2015   Procedure: COLONOSCOPY;  Surgeon: Gatha Mayer, MD;  Location: [facility];  Service: Endoscopy;  Laterality: N/A;  . ENTEROCELE REPAIR  2003   with colopexy.   . ESOPHAGOGASTRODUODENOSCOPY N/A 09/01/2014   Procedure: ESOPHAGOGASTRODUODENOSCOPY (EGD);  Surgeon: Lafayette Dragon, MD;  Location: [facility];  Service: Endoscopy;  Laterality: N/A;  . ESOPHAGOGASTRODUODENOSCOPY N/A 01/16/2015   Procedure: ESOPHAGOGASTRODUODENOSCOPY (EGD);  Surgeon: Gatha Mayer, MD;  Location: [facility] END[facility]COPY;  Service: Endoscopy;  Laterality: N/A;  . gastropexy  1989  . HIP ARTHROPLASTY Left 02/07/2014   Procedure: LEFT HIP PRESS FIT MONOPOLAR HEMIARTHROPLASTY ;  Surgeon: Marybelle Killings, MD;  Location: [facility];   Service: Orthopedics;  Laterality: Left;  . NISSEN FUNDOPLICATION     this ultimately failed and she underwent gastropexy   Social History   Occupational History  . Occupation: Retired    Fish farm manager: [employer]  Tobacco Use  . Smoking status: Former Smoker    Packs/day: 0.25    Years: 40.00    Pack years: 10.00    Types: Cigarettes  . Smokeless tobacco: Never Used  . Tobacco comment: "quit smoking ~ 2005" but was on/off during that time  Substance and Sexual Activity  . Alcohol use: No    Alcohol/week: 0.0 standard drinks    Comment: 11/26/2013 "drank a little alcohol; last drink was a long time ago"  . Drug use: No  . Sexual activity: Never

## 2018-05-07 ENCOUNTER — Encounter: Payer: Self-pay | Admitting: *Deleted

## 2018-05-14 DIAGNOSIS — H10523 Angular blepharoconjunctivitis, bilateral: Secondary | ICD-10-CM | POA: Diagnosis not present

## 2018-07-02 ENCOUNTER — Other Ambulatory Visit: Payer: Self-pay

## 2018-07-02 ENCOUNTER — Ambulatory Visit (INDEPENDENT_AMBULATORY_CARE_PROVIDER_SITE_OTHER): Payer: Medicare Other | Admitting: Internal Medicine

## 2018-07-02 ENCOUNTER — Encounter: Payer: Self-pay | Admitting: Internal Medicine

## 2018-07-02 VITALS — BP 143/82 | HR 74 | Temp 97.5°F | Ht 69.0 in | Wt 124.6 lb

## 2018-07-02 DIAGNOSIS — R630 Anorexia: Secondary | ICD-10-CM | POA: Diagnosis not present

## 2018-07-02 DIAGNOSIS — R1310 Dysphagia, unspecified: Secondary | ICD-10-CM | POA: Diagnosis not present

## 2018-07-02 LAB — POCT GLYCOSYLATED HEMOGLOBIN (HGB A1C): Hemoglobin A1C: 5.3 % (ref 4.0–5.6)

## 2018-07-02 LAB — GLUCOSE, CAPILLARY: Glucose-Capillary: 94 mg/dL (ref 70–99)

## 2018-07-02 NOTE — Patient Instructions (Addendum)
[name],  Carolyn [name]!! It was a pleasure meeting you today. I have sent in a referral to GI for a possible upper endoscopy due to your weight loss and pain with swallowing. I also want you to read more about Mirtazapine- this medication helps treat depression and also will help stimulate your appetite. We may also want to consider switching Meloxicam, your pain medication, to a different one to help decrease GI side effects. I want you to also read about Celebrix as another option for your pain medication.  Plan to follow up with your PCP in February. However, if you decide you want to start any of these medications please call and schedule an appointment sooner.   Carolyn Holidays!  Mirtazapine tablets What is this medicine? MIRTAZAPINE (mir TAZ a peen) is used to treat depression. This medicine may be used for other purposes; ask your health care provider or pharmacist if you have questions. COMMON BRAND NAME(S): Remeron What should I tell my health care provider before I take this medicine? They need to know if you have any of these conditions: -bipolar disorder -glaucoma -kidney disease -liver disease -suicidal thoughts -an unusual or allergic reaction to mirtazapine, other medicines, foods, dyes, or preservatives -pregnant or trying to get pregnant -breast-feeding How should I use this medicine? Take this medicine by mouth with a glass of water. Follow the directions on the prescription label. Take your medicine at regular intervals. Do not take your medicine more often than directed. Do not stop taking this medicine suddenly except upon the advice of your doctor. Stopping this medicine too quickly may cause serious side effects or your condition may worsen. A special MedGuide will be given to you by the pharmacist with each prescription and refill. Be sure to read this information carefully each time. Talk to your pediatrician regarding the use of this medicine in children.  Special care may be needed. Overdosage: If you think you have taken too much of this medicine contact a poison control center or emergency room at once. NOTE: This medicine is only for you. Do not share this medicine with others. What if I miss a dose? If you miss a dose, take it as soon as you can. If it is almost time for your next dose, take only that dose. Do not take double or extra doses. What may interact with this medicine? Do not take this medicine with any of the following medications: -linezolid -MAOIs like Carbex, Eldepryl, Marplan, Nardil, and Parnate -methylene blue (injected into a vein) This medicine may also interact with the following medications: -alcohol -antiviral medicines for HIV or AIDS -certain medicines that treat or prevent blood clots like warfarin -certain medicines for depression, anxiety, or psychotic disturbances -certain medicines for fungal infections like ketoconazole and itraconazole -certain medicines for migraine headache like almotriptan, eletriptan, frovatriptan, naratriptan, rizatriptan, sumatriptan, zolmitriptan -certain medicines for seizures like carbamazepine or phenytoin -certain medicines for sleep -cimetidine -erythromycin -fentanyl -lithium -medicines for blood pressure -nefazodone -rasagiline -rifampin -supplements like St. John's wort, kava kava, valerian -tramadol -tryptophan This list may not describe all possible interactions. Give your health care provider a list of all the medicines, herbs, non-prescription drugs, or dietary supplements you use. Also tell them if you smoke, drink alcohol, or use illegal drugs. Some items may interact with your medicine. What should I watch for while using this medicine? Tell your doctor if your symptoms do not get better or if they get worse. Visit your doctor or health care professional for  regular checks on your progress. Because it may take several weeks to see the full effects of this  medicine, it is important to continue your treatment as prescribed by your doctor. Patients and their families should watch out for new or worsening thoughts of suicide or depression. Also watch out for sudden changes in feelings such as feeling anxious, agitated, panicky, irritable, hostile, aggressive, impulsive, severely restless, overly excited and hyperactive, or not being able to sleep. If this happens, especially at the beginning of treatment or after a change in dose, call your health care professional. Dennis Bast may get drowsy or dizzy. Do not drive, use machinery, or do anything that needs mental alertness until you know how this medicine affects you. Do not stand or sit up quickly, especially if you are an older patient. This reduces the risk of dizzy or fainting spells. Alcohol may interfere with the effect of this medicine. Avoid alcoholic drinks. This medicine may cause dry eyes and blurred vision. If you wear contact lenses you may feel some discomfort. Lubricating drops may help. See your eye doctor if the problem does not go away or is severe. Your mouth may get dry. Chewing sugarless gum or sucking hard candy, and drinking plenty of water may help. Contact your doctor if the problem does not go away or is severe. What side effects may I notice from receiving this medicine? Side effects that you should report to your doctor or health care professional as soon as possible: -allergic reactions like skin rash, itching or hives, swelling of the face, lips, or tongue -anxious -changes in vision -chest pain -confusion -elevated mood, decreased need for sleep, racing thoughts, impulsive behavior -eye pain -fast, irregular heartbeat -feeling faint or lightheaded, falls -feeling agitated, angry, or irritable -fever or chills, sore throat -hallucination, loss of contact with reality -loss of balance or coordination -mouth sores -redness, blistering, peeling or loosening of the skin, including  inside the mouth -restlessness, pacing, inability to keep still -seizures -stiff muscles -suicidal thoughts or other mood changes -trouble passing urine or change in the amount of urine -trouble sleeping -unusual bleeding or bruising -unusually weak or tired -vomiting Side effects that usually do not require medical attention (report to your doctor or health care professional if they continue or are bothersome): -change in appetite -constipation -dizziness -dry mouth -muscle aches or pains -nausea -tired -weight gain This list may not describe all possible side effects. Call your doctor for medical advice about side effects. You may report side effects to FDA at 1-800-FDA-1088. Where should I keep my medicine? Keep out of the reach of children. Store at room temperature between 15 and 30 degrees C (59 and 86 degrees F) Protect from light and moisture. Throw away any unused medicine after the expiration date. NOTE: This sheet is a summary. It may not cover all possible information. If you have questions about this medicine, talk to your doctor, pharmacist, or health care provider.  2018 Elsevier/Gold Standard (2015-12-10 17:30:45)   Celecoxib capsules What is this medicine? CELECOXIB (sell a KOX ib) is a non-steroidal anti-inflammatory drug (NSAID). This medicine is used to treat arthritis and ankylosing spondylitis. It may be also used for pain or painful monthly periods. This medicine may be used for other purposes; ask your health care provider or pharmacist if you have questions. COMMON BRAND NAME(S): Celebrex What should I tell my health care provider before I take this medicine? They need to know if you have any of these conditions: -asthma -coronary  artery bypass graft (CABG) surgery within the past 2 weeks -drink more than 3 alcohol-containing drinks a day -heart disease or circulation problems like heart failure or leg edema (fluid retention) -high blood  pressure -kidney disease -liver disease -stomach bleeding or ulcers -an unusual or allergic reaction to celecoxib, sulfa drugs, aspirin, other NSAIDs, other medicines, foods, dyes, or preservatives -pregnant or trying to get pregnant -breast-feeding How should I use this medicine? Take this medicine by mouth with a full glass of water. Follow the directions on the prescription label. Take it with food if it upsets your stomach or if you take 400 mg at one time. Try to not lie down for at least 10 minutes after you take the medicine. Take the medicine at the same time each day. Do not take more medicine than you are told to take. Long-term, continuous use may increase the risk of heart attack or stroke. A special MedGuide will be given to you by the pharmacist with each prescription and refill. Be sure to read this information carefully each time. Talk to your pediatrician regarding the use of this medicine in children. Special care may be needed. Overdosage: If you think you have taken too much of this medicine contact a poison control center or emergency room at once. NOTE: This medicine is only for you. Do not share this medicine with others. What if I miss a dose? If you miss a dose, take it as soon as you can. If it is almost time for your next dose, take only that dose. Do not take double or extra doses. What may interact with this medicine? Do not take this medicine with any of the following medications: -cidofovir -methotrexate -other NSAIDs, medicines for pain and inflammation, like ibuprofen or naproxen -pemetrexed This medicine may also interact with the following medications: -alcohol -aspirin and aspirin-like drugs -diuretics -fluconazole -lithium -medicines for high blood pressure -steroid medicines like prednisone or cortisone -warfarin This list may not describe all possible interactions. Give your health care provider a list of all the medicines, herbs, non-prescription  drugs, or dietary supplements you use. Also tell them if you smoke, drink alcohol, or use illegal drugs. Some items may interact with your medicine. What should I watch for while using this medicine? Tell your doctor or health care professional if your pain does not get better. Talk to your doctor before taking another medicine for pain. Do not treat yourself. This medicine does not prevent heart attack or stroke. In fact, this medicine may increase the chance of a heart attack or stroke. The chance may increase with longer use of this medicine and in people who have heart disease. If you take aspirin to prevent heart attack or stroke, talk with your doctor or health care professional. Do not take medicines such as ibuprofen and naproxen with this medicine. Side effects such as stomach upset, nausea, or ulcers may be more likely to occur. Many medicines available without a prescription should not be taken with this medicine. This medicine can cause ulcers and bleeding in the stomach and intestines at any time during treatment. Ulcers and bleeding can happen without warning symptoms and can cause death. What side effects may I notice from receiving this medicine? Side effects that you should report to your doctor or health care professional as soon as possible: -allergic reactions like skin rash, itching or hives, swelling of the face, lips, or tongue -black or bloody stools, blood in the urine or vomit -blurred vision -breathing problems -  chest pain -nausea, vomiting -problems with balance, talking, walking -redness, blistering, peeling or loosening of the skin, including inside the mouth -unexplained weight gain or swelling -unusually weak or tired -yellowing of eyes, skin Side effects that usually do not require medical attention (report to your doctor or health care professional if they continue or are bothersome): -constipation or diarrhea -dizziness -gas or heartburn -upset stomach This  list may not describe all possible side effects. Call your doctor for medical advice about side effects. You may report side effects to FDA at 1-800-FDA-1088. Where should I keep my medicine? Keep out of the reach of children. Store at room temperature between 15 and 30 degrees C (59 and 86 degrees F). Keep container tightly closed. Throw away any unused medicine after the expiration date. NOTE: This sheet is a summary. It may not cover all possible information. If you have questions about this medicine, talk to your doctor, pharmacist, or health care provider.  2018 Elsevier/Gold Standard (2009-09-09 10:54:17)

## 2018-07-02 NOTE — Progress Notes (Signed)
   CC: decreased appetite   HPI:  Carolyn [name] is a 84 y.o. with HTN, hypothyroidism, osteoporosis with history of femoral neck fracture, history of barretts esophagus, chronic LBP and suspected dementia who presents today for decreased appetite over the past few weeks.  For details of today's visit and the status of his chronic medical issues please refer to the assessment and plan.   Past Medical History:  Diagnosis Date  . Arthritis 11/2013.    "both knees" (11/26/2013)  . Asthma    "only when I smoked"  . Barrett's esophagus since at least 1985  . CKD (chronic kidney disease)    GFR 55 11/2014   . COPD (chronic obstructive pulmonary disease) ([place])   . Depression with anxiety 11/2013   "little bit"   . Diverticulosis of colon 1996  . Failure to thrive in adult 10/24/2016  . GAVE (gastric antral vascular ectasia) 01/16/2015  . Hiatal hernia    s/p gastropexy after failed Nissen fundoplication.   . High cholesterol   . Hx of adenomatous colonic polyps   . Hypertension   . Hypothyroidism   . Pneumonia 11/2013   Review of Systems:   Review of Systems  Constitutional: Positive for malaise/fatigue and weight loss. Negative for fever.  Gastrointestinal: Positive for abdominal pain, constipation and diarrhea. Negative for blood in stool, melena, nausea and vomiting.     Physical Exam:  Vitals:   07/02/18 1344  BP: (!) 143/82  Pulse: 74  Temp: (!) 97.5 F (36.4 C)  TempSrc: Oral  Weight: 124 lb 9.6 oz (56.5 kg)  Height: 5\' 9"  (1.753 m)    Physical Exam  Constitutional: She is oriented to person, place, and time.  Frail caucasian female in wheelchair. No acute distress.  Son present.  Cardiovascular: Normal rate and regular rhythm.  Pulmonary/Chest: Effort normal and breath sounds normal. No respiratory distress. She has no wheezes.  Abdominal: Soft. Bowel sounds are normal. She exhibits no distension. There is no tenderness. There is no guarding.  Musculoskeletal:  She exhibits no edema.  Neurological: She is alert and oriented to person, place, and time.  Skin: Skin is warm and dry.  Psychiatric: Mood, memory, affect and judgment normal.    Assessment & Plan:   See Encounters Tab for problem based charting.  Patient seen with Dr. Angelia Mould

## 2018-07-02 NOTE — Assessment & Plan Note (Addendum)
Patient presented to the clinic today to be evaluated for chronic decreased appetite which has been worse over the past few weeks.  Her son who was present during the evaluation noted that she has been eating and drinking less and becoming full quicker.  Patient reported pain and difficulty with swallowing solid foods.  She said she sometimes has a cramping abdominal pain and sometimes has diarrhea and constipation.  She denied any nausea, vomiting or blood in stool.  She said she is still been drinking two nutritional supplements daily.  Patient weight 129 pounds during her last visit in October.  Today she weighs 124.6 pounds.  Given her recent weight loss, history of Barrett's esophagus and pain with swallowing I think patient would benefit from an EGD.  Patient was also informed about a medication called mirtazapine that would treat her depression and stimulate appetite.  Patient's son wanted to discuss this medication with his sister and will schedule a closer follow-up if they want to start this medication.  Patient and son are also informed that meloxicam can sometimes cause GI symptoms and can consider switching her to another medication called celecoxib.  Patient sent reported there to follow-up with PCP in February; however they will schedule closer follow-up if they decide to start either of these medications.  Plan: - Ambulatory referral to low-power GI for possible EGD -Follow-up CBC, CMP and hemoglobin A1c -Follow-up to see if patient wants to switch from Cymbalta to mirtazapine and which meloxicam to celecoxib

## 2018-07-03 LAB — CBC
Hematocrit: 39.2 % (ref 34.0–46.6)
Hemoglobin: 13.2 g/dL (ref 11.1–15.9)
MCH: 31.1 pg (ref 26.6–33.0)
MCHC: 33.7 g/dL (ref 31.5–35.7)
MCV: 93 fL (ref 79–97)
Platelets: 257 10*3/uL (ref 150–450)
RBC: 4.24 x10E6/uL (ref 3.77–5.28)
RDW: 12.7 % (ref 12.3–15.4)
WBC: 6 10*3/uL (ref 3.4–10.8)

## 2018-07-03 LAB — CMP14 + ANION GAP
ALT: 7 IU/L (ref 0–32)
AST: 17 IU/L (ref 0–40)
Albumin/Globulin Ratio: 1.2 (ref 1.2–2.2)
Albumin: 3.9 g/dL (ref 3.5–4.7)
Alkaline Phosphatase: 58 IU/L (ref 39–117)
Anion Gap: 19 mmol/L — ABNORMAL HIGH (ref 10.0–18.0)
BUN/Creatinine Ratio: 28 (ref 12–28)
BUN: 31 mg/dL — ABNORMAL HIGH (ref 8–27)
Bilirubin Total: 0.3 mg/dL (ref 0.0–1.2)
CO2: 22 mmol/L (ref 20–29)
Calcium: 9.9 mg/dL (ref 8.7–10.3)
Chloride: 94 mmol/L — ABNORMAL LOW (ref 96–106)
Creatinine, Ser: 1.09 mg/dL — ABNORMAL HIGH (ref 0.57–1.00)
GFR calc Af Amer: 53 mL/min/{1.73_m2} — ABNORMAL LOW (ref >59)
GFR calc non Af Amer: 46 mL/min/{1.73_m2} — ABNORMAL LOW (ref >59)
Globulin, Total: 3.2 g/dL (ref 1.5–4.5)
Glucose: 89 mg/dL (ref 65–99)
Potassium: 5.3 mmol/L — ABNORMAL HIGH (ref 3.5–5.2)
Sodium: 135 mmol/L (ref 134–144)
Total Protein: 7.1 g/dL (ref 6.0–8.5)

## 2018-07-03 NOTE — Progress Notes (Signed)
Internal Medicine Clinic Attending  I saw and evaluated the patient.  I personally confirmed the key portions of the history and exam documented by Dr.  Rehman  and I reviewed pertinent patient test results.  The assessment, diagnosis, and plan were formulated together and I agree with the documentation in the resident's note.  

## 2018-07-03 NOTE — Assessment & Plan Note (Addendum)
Patient presented to the clinic today to be evaluated for chronic decreased appetite which has been worse over the past few weeks.  Her son who was present during the evaluation noted that she has been eating and drinking less and becoming full quicker.  Patient reported pain and difficulty with swallowing solid foods.  She said she sometimes has a cramping abdominal pain and sometimes has diarrhea and constipation.  She denied any nausea, vomiting or blood in stool.  She said she is still been drinking two nutritional supplements daily.  Patient weight 129 pounds during her last visit in October.  Today she weighs 124.6 pounds.  Given her recent weight loss, history of Barrett's esophagus and pain with swallowing I think patient would benefit from an EGD.  Patient was also informed about a medication called mirtazapine that would treat her depression and stimulate appetite.  Patient's son wanted to discuss this medication with his sister and will schedule a closer follow-up if they want to start this medication.  Patient and son are also informed that meloxicam can sometimes cause GI symptoms and can consider switching her to another medication called celecoxib.  Patient sent reported there to follow-up with PCP in February; however they will schedule closer follow-up if they decide to start either of these medications.  Plan: - Ambulatory referral to low-power GI for possible EGD -Follow-up CBC, CMP and hemoglobin A1c -Follow-up to see if patient wants to switch from Cymbalta to mirtazapine and which meloxicam to celecoxib

## 2018-07-04 ENCOUNTER — Telehealth: Payer: Self-pay | Admitting: Internal Medicine

## 2018-07-04 ENCOUNTER — Other Ambulatory Visit: Payer: Self-pay | Admitting: Internal Medicine

## 2018-07-04 DIAGNOSIS — F329 Major depressive disorder, single episode, unspecified: Secondary | ICD-10-CM

## 2018-07-04 DIAGNOSIS — F32A Depression, unspecified: Secondary | ICD-10-CM

## 2018-07-04 DIAGNOSIS — M810 Age-related osteoporosis without current pathological fracture: Secondary | ICD-10-CM

## 2018-07-04 DIAGNOSIS — R634 Abnormal weight loss: Secondary | ICD-10-CM

## 2018-07-04 DIAGNOSIS — G8929 Other chronic pain: Secondary | ICD-10-CM

## 2018-07-04 DIAGNOSIS — M545 Low back pain: Secondary | ICD-10-CM

## 2018-07-04 DIAGNOSIS — F0390 Unspecified dementia without behavioral disturbance: Secondary | ICD-10-CM

## 2018-07-04 DIAGNOSIS — R630 Anorexia: Secondary | ICD-10-CM

## 2018-07-04 MED ORDER — MIRTAZAPINE 15 MG PO TABS: 15.0000 mg | ORAL_TABLET | Freq: Every day | ORAL | 2 refills | Status: DC

## 2018-07-04 MED ORDER — CELECOXIB 200 MG PO CAPS: 200.0000 mg | ORAL_CAPSULE | Freq: Two times a day (BID) | ORAL | 2 refills | Status: DC

## 2018-07-04 NOTE — Telephone Encounter (Signed)
Called patient's daughter regarding patient's lab work. She wanted to go ahead and try the two new medications that were requested- switch Cymbalta to Mirtazapine and switch Meloxicam to Celecoxib. Patient's daughter is also requesting home health services because patient's confusion is getting worse and she is not able to get around on her own.  Plan: -stop cymbalta -start mirtazapine 15 mg once daily and increase as tolerated  -stop meloxicam -start celecoxib 200 mg once daily

## 2018-07-10 ENCOUNTER — Encounter: Payer: Self-pay | Admitting: Physician Assistant

## 2018-07-16 ENCOUNTER — Encounter: Payer: Self-pay | Admitting: Physician Assistant

## 2018-07-23 ENCOUNTER — Other Ambulatory Visit: Payer: Self-pay

## 2018-07-23 DIAGNOSIS — F32A Depression, unspecified: Secondary | ICD-10-CM

## 2018-07-23 DIAGNOSIS — G8929 Other chronic pain: Secondary | ICD-10-CM

## 2018-07-23 DIAGNOSIS — M545 Low back pain: Principal | ICD-10-CM

## 2018-07-23 DIAGNOSIS — R630 Anorexia: Secondary | ICD-10-CM

## 2018-07-23 DIAGNOSIS — F329 Major depressive disorder, single episode, unspecified: Secondary | ICD-10-CM

## 2018-07-23 DIAGNOSIS — R634 Abnormal weight loss: Secondary | ICD-10-CM

## 2018-07-23 MED ORDER — MIRTAZAPINE 15 MG PO TABS: 15.0000 mg | ORAL_TABLET | Freq: Every day | ORAL | 2 refills | Status: DC

## 2018-07-23 MED ORDER — CELECOXIB 200 MG PO CAPS: 200.0000 mg | ORAL_CAPSULE | Freq: Two times a day (BID) | ORAL | 2 refills | Status: DC

## 2018-07-23 NOTE — Telephone Encounter (Signed)
mirtazapine (REMERON) 15 MG tablet,   celecoxib (CELEBREX) 200 MG capsule   Refill request @  [facility] [place], [place] [phone] (Phone) [phone] (Fax)   Requesting for 90 days supply.

## 2018-07-30 ENCOUNTER — Ambulatory Visit: Payer: Medicare Other | Admitting: Physician Assistant

## 2018-07-31 ENCOUNTER — Encounter: Payer: Medicare Other | Admitting: Internal Medicine

## 2018-08-01 ENCOUNTER — Telehealth: Payer: Self-pay | Admitting: *Deleted

## 2018-08-01 NOTE — Telephone Encounter (Signed)
Met with Glyn Ade, RN with [facility]. She is unable to take patient 2/2 patient's insurance [facility]). Will call different agency. Hubbard Hartshorn, RN, BSN

## 2018-08-01 NOTE — Telephone Encounter (Signed)
Have emailed Joen Laura, RN with [facility] for [facility] PT and [facility] Aide orders. She will let us know if she is able to take this patient. Hubbard Hartshorn, RN, BSN

## 2018-08-02 ENCOUNTER — Ambulatory Visit: Payer: Medicare Other

## 2018-08-02 NOTE — Telephone Encounter (Signed)
Received call from [facility] at [facility]. They will be able to provide [facility] PT and [facility] Aide for patient. SOC date 08/06/2018 or 08/07/2018. Hubbard Hartshorn, RN, BSN

## 2018-08-03 ENCOUNTER — Other Ambulatory Visit: Payer: Self-pay | Admitting: Student in an Organized Health Care Education/Training Program

## 2018-08-03 DIAGNOSIS — G8929 Other chronic pain: Secondary | ICD-10-CM

## 2018-08-03 DIAGNOSIS — M545 Low back pain, unspecified: Secondary | ICD-10-CM

## 2018-08-06 ENCOUNTER — Encounter: Payer: Self-pay | Admitting: Physician Assistant

## 2018-08-06 ENCOUNTER — Ambulatory Visit: Payer: Medicare Other | Admitting: Physician Assistant

## 2018-08-06 VITALS — BP 130/78 | HR 88 | Ht 66.0 in | Wt 127.0 lb

## 2018-08-06 DIAGNOSIS — K449 Diaphragmatic hernia without obstruction or gangrene: Secondary | ICD-10-CM

## 2018-08-06 DIAGNOSIS — Z8719 Personal history of other diseases of the digestive system: Secondary | ICD-10-CM

## 2018-08-06 DIAGNOSIS — R1310 Dysphagia, unspecified: Secondary | ICD-10-CM

## 2018-08-06 NOTE — Patient Instructions (Signed)
Continue Nexium 40 mg over the counter-( take 20 mg by mouth twice daily.) Continue thick liquids to a very sofr diet.   You have been scheduled for an endoscopy. Please follow written instructions given to you at your visit today. If you use inhalers (even only as needed), please bring them with you on the day of your procedure.  Normal BMI (Body Mass Index- based on height and weight) is between 23 and 30. Your BMI today is Body mass index is 20.5 kg/m. Marland Kitchen Please consider follow up  regarding your BMI with your Primary Care Provider.

## 2018-08-06 NOTE — Progress Notes (Signed)
Subjective:    Patient ID: Carolyn [name], female    DOB: [birth year], 85 y.o.   MRN: [id number]  HPI Carolyn [name] is a pleasant 85 year old white female, referred by internal medicine clinic/[place] Dr. Dereck [name] for odynophagia.  Patient is previously known to Dr. Delfin [name], and was seen by Dr. Carlean [name] during hospitalization in 2016 for GI bleeding and underwent endoscopic evaluation. She has history of hypertension, dementia, chronic kidney disease, and GAVE syndrome. Patient also with history of Barrett's esophagus documented in 1999, in 2004, no Barrett's at EGD in 2007 oh obvious Barrett's at EGD 2016 though not biopsied.  She has history of chronic GERD and had a remote Nissen fundoplication which failed and was then surgically managed with a Belsey gastropexy. Patient has been on long-term Nexium 40 mg twice daily which she says works pretty well.  According to her daughter she does occasionally complain of heartburn and indigestion.  More recently she has been complaining of dysphasia.  She says it feels like her food fills up in her throat" she will have occasional "spitting up".  She is also complaining of intermittent odynophagia will sometimes get a sharp pain with swallowing.  She has been avoiding most solid foods recently, not eating meat other than occasional chicken tenders and tends to want to stick to very soft foods or liquids.  She is a poor historian and Her family says that she really has not had an appetite for a long time, they currently have her drinking Ensure twice daily and other than that she will generally eat 1 small meal per day.  She did drink a milkshake yesterday which she enjoyed.  Her weight has been stable.  She has no complaints of abdominal pain.  She is on chronic NSAID-just switched to Celebrex. Last EGD June 2016 for bleeding with finding of GAVE which was treated with APC also noted hiatal hernia, no obvious Barrett's. Colonoscopy June 2016 with 2 diminutive  polyps removed and sigmoid diverticulosis  Patient had upper GI done in February 2016 which showed a small to moderate paraesophageal hernia, there was mild extrinsic compression on the distal thoracic esophagus, and a large amount of free reflux with patient horizontal..  Review of Systems Pertinent positive and negative review of systems were noted in the above HPI section.  All other review of systems was otherwise negative.  Outpatient Encounter Medications as of 08/06/2018  Medication Sig  . Calcium Carbonate-Vitamin D (CALTRATE 600+D) 600-400 MG-UNIT per tablet Take 1 tablet by mouth daily.  . celecoxib (CELEBREX) 200 MG capsule Take 1 capsule (200 mg total) by mouth 2 (two) times daily. Stop taking meloxicam before starting this medication.  . Cholecalciferol (VITAMIN D3) 400 units tablet Take 2 tablets (800 Units total) daily by mouth.  . conjugated estrogens (PREMARIN) vaginal cream Place 1 Applicatorful daily vaginally.  . gabapentin (NEURONTIN) 300 MG capsule Take 2 capsules (600 mg total) by mouth at bedtime.  . irbesartan (AVAPRO) 150 MG tablet Take 1 tablet (150 mg total) by mouth daily.  Marland Kitchen levothyroxine (SYNTHROID, LEVOTHROID) 100 MCG tablet TAKE 1 TABLET BY MOUTH  DAILY BEFORE BREAKFAST FOR  HYPOTHYROIDISM  . metoprolol succinate (TOPROL-XL) 25 MG 24 hr tablet TAKE 1 TABLET BY MOUTH  DAILY  . mirtazapine (REMERON) 15 MG tablet Take 1 tablet (15 mg total) by mouth at bedtime. Stop taking Cymbalta before starting this medication.  . Omega-3 Fatty Acids (FISH OIL) 1000 MG CAPS Take 100 mg by mouth daily.  Marland Kitchen  omeprazole (PRILOSEC) 20 MG capsule Take 20 mg by mouth 2 (two) times daily.  Marland Kitchen oxybutynin (DITROPAN-XL) 5 MG 24 hr tablet TAKE 1 TABLET BY MOUTH  EVERY NIGHT AT BEDTIME  . ondansetron (ZOFRAN) 4 MG tablet Take by mouth.   No facility-administered encounter medications on file as of 08/06/2018.    Allergies  Allergen Reactions  . Demerol [Meperidine Hcl] Other (See Comments)      Made crazy  . Penicillins Hives   Patient Active Problem List   Diagnosis Date Noted  . Paraesophageal hernia 08/06/2018  . Odynophagia 07/02/2018  . Decreased appetite 07/02/2018  . Chronic low back pain 02/21/2018  . Dementia ([place]) 02/20/2018  . Weight loss 06/06/2017  . Need for immunization against influenza 06/06/2017  . Osteoporosis 04/17/2017  . Healthcare maintenance 02/23/2016  . OAB (overactive bladder) 02/02/2016  . Hyperlipidemia 12/08/2015  . Nonrheumatic aortic valve insufficiency 10/19/2015  . PAC (premature atrial contraction) 09/09/2015  . History of DVT (deep vein thrombosis) 10/02/2014  . Barrett's esophagus   . Fracture of femoral neck, right ([place]) 02/07/2014  . Hypothyroidism 02/07/2014  . Essential hypertension 02/07/2014   Social History   Socioeconomic History  . Marital status: Widowed    Spouse name: Not on file  . Number of children: 3  . Years of education: 8th  . Highest education level: Not on file  Occupational History  . Occupation: Retired    Fish farm manager: [employer]  Social Needs  . Financial resource strain: Not on file  . Food insecurity:    Worry: Not on file    Inability: Not on file  . Transportation needs:    Medical: Not on file    Non-medical: Not on file  Tobacco Use  . Smoking status: Former Smoker    Packs/day: 0.25    Years: 40.00    Pack years: 10.00    Types: Cigarettes  . Smokeless tobacco: Never Used  . Tobacco comment: "quit smoking ~ 2005" but was on/off during that time  Substance and Sexual Activity  . Alcohol use: No    Alcohol/week: 0.0 standard drinks    Comment: 11/26/2013 "drank a little alcohol; last drink was a long time ago"  . Drug use: No  . Sexual activity: Never  Lifestyle  . Physical activity:    Days per week: Not on file    Minutes per session: Not on file  . Stress: Not on file  Relationships  . Social connections:    Talks on phone: Not on file    Gets together: Not on file     Attends religious service: Not on file    Active member of club or organization: Not on file    Attends meetings of clubs or organizations: Not on file    Relationship status: Not on file  . Intimate partner violence:    Fear of current or ex partner: Not on file    Emotionally abused: Not on file    Physically abused: Not on file    Forced sexual activity: Not on file  Other Topics Concern  . 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 Routzahn's family history includes Bladder Cancer in her sister; Heart disease in her brother, brother, father, and mother; Stomach cancer in her mother.      Objective:    Vitals:   08/06/18  1003  BP: 130/78  Pulse: 88    Physical Exam; well-developed thin, elderly white female in no acute distress, she is accompanied by her daughter.  Daughter gives much of the history.  Patient is ambulatory with a cane.  Height 5 foot 6, weight 127, BMI 20.5.  HEENT; nontraumatic normocephalic EOMI PERRLA sclera anicteric oral mucosa moist, Cardiovascular; regular rate and rhythm with S1-S2.  Pulmonary clear bilaterally, Abdomen; soft, nontender nondistended bowel sounds are active there is no palpable mass or hepatosplenomegaly Rectal ;exam not done, Extremities; no clubbing cyanosis or edema skin warm and dry, Neuropsych; alert and oriented, grossly nonfocal, poor historian, affect appropriate       Assessment & Plan:   #23 85 year old white female with long history of chronic GERD maintained on twice daily PPI with recent complaints of odynophagia, and dysphagia to solids. Patient is status post remote Nissen fundoplication which failed and was followed by Belsey  gastropexy.  Upper GI in February 2016 showed a small to moderate paraesophageal hernia and mild extrinsic compression of the distal thoracic esophagus as well as a large amount of free reflux with supine  position.  Rule out esophageal stricture, rule out esophageal neoplasm, rule out acute esophagitis superimposed on paraesophageal hernia.  Her symptoms may all be secondary to paraesophageal hernia.  #2 history of gave syndrome status post APC 2016 #3.  Diverticulosis #4.  Chronic kidney disease #5.  Mild dementia #6.  Hypertension #7.  History of Barrett's esophagus-no Barrett's on biopsies 2007.  Plan; Continue OTC Nexium 20 mg #2 p.o. twice daily (this has  been cheaper for the patient and prescription Nexium) Continue full liquid to very soft diet, this was discussed in detail with the patient and her daughter ,Advised she avoid meat and most breads, and drink at least 2 Ensure every day. We discussed options of proceeding with barium swallow initially versus EGD.  Patient's daughter does not think that she will be amenable to drinking barium. Will schedule for EGD, with possible dilation with Dr. Carlean [name].  Procedure was discussed in detail with the patient and her daughter including indications risks and benefits and they are agreeable to proceed. Further plans pending results of the EGD.   S  PA-C 08/06/2018   Cc: Rehman, Areeg N, DO

## 2018-08-07 ENCOUNTER — Ambulatory Visit (AMBULATORY_SURGERY_CENTER): Payer: Medicare Other | Admitting: Internal Medicine

## 2018-08-07 ENCOUNTER — Encounter: Payer: Self-pay | Admitting: Internal Medicine

## 2018-08-07 VITALS — BP 142/72 | HR 81 | Temp 98.0°F | Resp 14 | Ht 66.0 in | Wt 127.0 lb

## 2018-08-07 DIAGNOSIS — K209 Esophagitis, unspecified: Secondary | ICD-10-CM | POA: Diagnosis not present

## 2018-08-07 DIAGNOSIS — R1310 Dysphagia, unspecified: Secondary | ICD-10-CM | POA: Diagnosis not present

## 2018-08-07 DIAGNOSIS — K2950 Unspecified chronic gastritis without bleeding: Secondary | ICD-10-CM | POA: Diagnosis not present

## 2018-08-07 MED ORDER — SODIUM CHLORIDE 0.9 % IV SOLN: 500.0000 mL | Freq: Once | INTRAVENOUS | Status: DC

## 2018-08-07 NOTE — Patient Instructions (Addendum)
The end of the esophagus is irritated and inflamed. I took biopsies to understand it better. Did not see anything to dilate. Some food in the stomach also - suggests delayed stomach emptying.  Please stay on current medications and we will contact you with biopsy results and plans.  Follow an antireflux regimen.  This includes:      - Do not lie down for at least 3 to 4 hours after meals.       - Raise the head of the bed 4 to 6 inches.       - Decrease excess weight.       - Avoid citrus juices and other acidic foods, alcohol, chocolate, mints, coffee and other caffeinated beverages, carbonated beverages, fatty and fried foods.       - Avoid tight-fitting clothing.       - Avoid cigarettes and other tobacco products.   I appreciate the opportunity to care for you.  Gatha Mayer, MD, [facility]  YOU HAD AN ENDOSCOPIC PROCEDURE TODAY AT [facility]:   Refer to the procedure report that was given to you for any specific questions about what was found during the examination.  If the procedure report does not answer your questions, please call your gastroenterologist to clarify.  If you requested that your care partner not be given the details of your procedure findings, then the procedure report has been included in a sealed envelope for you to review at your convenience later.  YOU SHOULD EXPECT: Some feelings of bloating in the abdomen. Passage of more gas than usual.  Walking can help get rid of the air that was put into your GI tract during the procedure and reduce the bloating. If you had a lower endoscopy (such as a colonoscopy or flexible sigmoidoscopy) you may notice spotting of blood in your stool or on the toilet paper. If you underwent a bowel prep for your procedure, you may not have a normal bowel movement for a few days.  Please Note:  You might notice some irritation and congestion in your nose or some drainage.  This is from the  oxygen used during your procedure.  There is no need for concern and it should clear up in a day or so.  SYMPTOMS TO REPORT IMMEDIATELY:    Following upper endoscopy (EGD)  Vomiting of blood or coffee ground material  New chest pain or pain under the shoulder blades  Painful or persistently difficult swallowing  New shortness of breath  Fever of 100F or higher  Black, tarry-looking stools  For urgent or emergent issues, a gastroenterologist can be reached at any hour by calling [phone].   DIET:  We do recommend a small meal at first, but then you may proceed to your regular diet.  Drink plenty of fluids but you should avoid alcoholic beverages for 24 hours.  ACTIVITY:  You should plan to take it easy for the rest of today and you should NOT DRIVE or use heavy machinery until tomorrow (because of the sedation medicines used during the test).    FOLLOW UP: Our staff will call the number listed on your records the next business day following your procedure to check on you and address any questions or concerns that you may have regarding the information given to you following your procedure. If we do not reach you, we will leave a message.  However, if you are feeling well and you are not experiencing  any problems, there is no need to return our call.  We will assume that you have returned to your regular daily activities without incident.  If any biopsies were taken you will be contacted by phone or by letter within the next 1-3 weeks.  Please call us at [phone] if you have not heard about the biopsies in 3 weeks.    SIGNATURES/CONFIDENTIALITY: You and/or your care partner have signed paperwork which will be entered into your electronic medical record.  These signatures attest to the fact that that the information above on your After Visit Summary has been reviewed and is understood.  Full responsibility of the confidentiality of this discharge information lies with you and/or  your care-partner.

## 2018-08-07 NOTE — Progress Notes (Signed)
Called to room to assist during endoscopic procedure.  Patient ID and intended procedure confirmed with present staff. Received instructions for my participation in the procedure from the performing physician.  

## 2018-08-07 NOTE — Op Note (Signed)
[facility] Patient Name: Carolyn [name] Procedure Date: 08/07/2018 10:24 AM MRN: [id number] Endoscopist: Gatha Mayer , MD Age: 85 Referring MD:  Date of Birth: [birth year] Gender: Female Account #: [id number] Procedure:                Upper GI endoscopy Indications:              Dysphagia, Esophageal reflux, Follow-up of                            esophageal reflux Medicines:                Propofol per Anesthesia, Monitored Anesthesia Care Procedure:                Pre-Anesthesia Assessment:                           - Prior to the procedure, a History and Physical                            was performed, and patient medications and                            allergies were reviewed. The patient's tolerance of                            previous anesthesia was also reviewed. The risks                            and benefits of the procedure and the sedation                            options and risks were discussed with the patient.                            All questions were answered, and informed consent                            was obtained. Prior Anticoagulants: The patient has                            taken no previous anticoagulant or antiplatelet                            agents. ASA Grade Assessment: III - A patient with                            severe systemic disease. After reviewing the risks                            and benefits, the patient was deemed in                            satisfactory condition to undergo the procedure.  After obtaining informed consent, the endoscope was                            passed under direct vision. Throughout the                            procedure, the patient's blood pressure, pulse, and                            oxygen saturations were monitored continuously. The                            Endoscope was introduced through the mouth, and                            advanced to the  second part of duodenum. The upper                            GI endoscopy was accomplished without difficulty.                            The patient tolerated the procedure well. Scope In: Scope Out: Findings:                 Esophagitis with no bleeding was found 29 to 35 cm                            from the incisors. Biopsies were taken with a cold                            forceps for histology. Verification of patient                            identification for the specimen was done. Estimated                            blood loss was minimal.                           A 5 cm hiatal hernia was present.                           A medium amount of food (residue) was found in the                            cardia, in the gastric fundus, in the gastric body                            and in the gastric antrum.                           The examined duodenum was normal.  The cardia and gastric fundus were otherwise normal                            on retroflexion, limitede by retained food. Complications:            No immediate complications. Estimated Blood Loss:     Estimated blood loss was minimal. Impression:               - Reflux esophagitis. Biopsied. Cobblestone                            appearance and some larger inflammatory patches                            also.                           - 5 cm hiatal hernia. No classic finidngs of                            paraesophageal hernia.                           - A medium amount of food (residue) in the stomach.                           - Normal examined duodenum. Recommendation:           - Patient has a contact number available for                            emergencies. The signs and symptoms of potential                            delayed complications were discussed with the                            patient. Return to normal activities tomorrow.                            Written  discharge instructions were provided to the                            patient.                           - Resume previous diet.                           - Continue present medications.                           - Await pathology results.                           - Follow an antireflux regimen.  This includes:                           - Do not lie down for at least 3 to 4 hours after                            meals.                           - Raise the head of the bed 4 to 6 inches.                           - Decrease excess weight.                           - Avoid citrus juices and other acidic foods,                            alcohol, chocolate, mints, coffee and other                            caffeinated beverages, carbonated beverages, fatty                            and fried foods.                           - Avoid tight-fitting clothing.                           - Avoid cigarettes and other tobacco products. Gatha Mayer, MD 08/07/2018 10:59:45 AM This report has been signed electronically.

## 2018-08-07 NOTE — Progress Notes (Signed)
Spontaneous respirations throughout. VSS. Resting comfortably. To PACU on room air. Report to  RN. 

## 2018-08-07 NOTE — Progress Notes (Signed)
Pt's states no medical or surgical changes since previsit or office visit. 

## 2018-08-08 ENCOUNTER — Telehealth: Payer: Self-pay

## 2018-08-08 DIAGNOSIS — K2270 Barrett's esophagus without dysplasia: Secondary | ICD-10-CM | POA: Diagnosis not present

## 2018-08-08 DIAGNOSIS — N189 Chronic kidney disease, unspecified: Secondary | ICD-10-CM | POA: Diagnosis not present

## 2018-08-08 DIAGNOSIS — K5730 Diverticulosis of large intestine without perforation or abscess without bleeding: Secondary | ICD-10-CM | POA: Diagnosis not present

## 2018-08-08 DIAGNOSIS — M545 Low back pain: Secondary | ICD-10-CM | POA: Diagnosis not present

## 2018-08-08 DIAGNOSIS — I129 Hypertensive chronic kidney disease with stage 1 through stage 4 chronic kidney disease, or unspecified chronic kidney disease: Secondary | ICD-10-CM | POA: Diagnosis not present

## 2018-08-08 DIAGNOSIS — E039 Hypothyroidism, unspecified: Secondary | ICD-10-CM | POA: Diagnosis not present

## 2018-08-08 DIAGNOSIS — Z9181 History of falling: Secondary | ICD-10-CM | POA: Diagnosis not present

## 2018-08-08 DIAGNOSIS — G8929 Other chronic pain: Secondary | ICD-10-CM | POA: Diagnosis not present

## 2018-08-08 DIAGNOSIS — M170 Bilateral primary osteoarthritis of knee: Secondary | ICD-10-CM | POA: Diagnosis not present

## 2018-08-08 DIAGNOSIS — M810 Age-related osteoporosis without current pathological fracture: Secondary | ICD-10-CM | POA: Diagnosis not present

## 2018-08-08 DIAGNOSIS — J449 Chronic obstructive pulmonary disease, unspecified: Secondary | ICD-10-CM | POA: Diagnosis not present

## 2018-08-08 NOTE — Telephone Encounter (Signed)
No answer, left message to call back later today, B.Teah Votaw RN. 

## 2018-08-08 NOTE — Telephone Encounter (Signed)
Left message to call if questions or concerns.  

## 2018-08-13 ENCOUNTER — Other Ambulatory Visit: Payer: Self-pay

## 2018-08-13 ENCOUNTER — Other Ambulatory Visit: Payer: Self-pay | Admitting: Internal Medicine

## 2018-08-13 DIAGNOSIS — M810 Age-related osteoporosis without current pathological fracture: Secondary | ICD-10-CM | POA: Diagnosis not present

## 2018-08-13 DIAGNOSIS — J449 Chronic obstructive pulmonary disease, unspecified: Secondary | ICD-10-CM | POA: Diagnosis not present

## 2018-08-13 DIAGNOSIS — K2270 Barrett's esophagus without dysplasia: Secondary | ICD-10-CM | POA: Diagnosis not present

## 2018-08-13 DIAGNOSIS — K5730 Diverticulosis of large intestine without perforation or abscess without bleeding: Secondary | ICD-10-CM | POA: Diagnosis not present

## 2018-08-13 DIAGNOSIS — Z9181 History of falling: Secondary | ICD-10-CM | POA: Diagnosis not present

## 2018-08-13 DIAGNOSIS — I129 Hypertensive chronic kidney disease with stage 1 through stage 4 chronic kidney disease, or unspecified chronic kidney disease: Secondary | ICD-10-CM | POA: Diagnosis not present

## 2018-08-13 DIAGNOSIS — N189 Chronic kidney disease, unspecified: Secondary | ICD-10-CM | POA: Diagnosis not present

## 2018-08-13 DIAGNOSIS — E039 Hypothyroidism, unspecified: Secondary | ICD-10-CM | POA: Diagnosis not present

## 2018-08-13 DIAGNOSIS — M545 Low back pain: Secondary | ICD-10-CM | POA: Diagnosis not present

## 2018-08-13 DIAGNOSIS — G8929 Other chronic pain: Secondary | ICD-10-CM | POA: Diagnosis not present

## 2018-08-13 DIAGNOSIS — M170 Bilateral primary osteoarthritis of knee: Secondary | ICD-10-CM | POA: Diagnosis not present

## 2018-08-13 MED ORDER — SUCRALFATE 1 GM/10ML PO SUSP: 1.0000 g | Freq: Four times a day (QID) | ORAL | 1 refills | Status: DC

## 2018-08-13 NOTE — Progress Notes (Signed)
Notifying Amy Esterwood, PA-C  No recall/letter from [employer] - there is inflammation/reflux esophagitis seen No stricture Do not think much we can do with a scope and would not be a surgery candidate Consider adding carafate -  Stay on modified diet F/u you or me in 2 months Thanks

## 2018-08-15 DIAGNOSIS — G8929 Other chronic pain: Secondary | ICD-10-CM | POA: Diagnosis not present

## 2018-08-15 DIAGNOSIS — Z9181 History of falling: Secondary | ICD-10-CM | POA: Diagnosis not present

## 2018-08-15 DIAGNOSIS — M810 Age-related osteoporosis without current pathological fracture: Secondary | ICD-10-CM | POA: Diagnosis not present

## 2018-08-15 DIAGNOSIS — M170 Bilateral primary osteoarthritis of knee: Secondary | ICD-10-CM | POA: Diagnosis not present

## 2018-08-15 DIAGNOSIS — J449 Chronic obstructive pulmonary disease, unspecified: Secondary | ICD-10-CM | POA: Diagnosis not present

## 2018-08-15 DIAGNOSIS — E039 Hypothyroidism, unspecified: Secondary | ICD-10-CM | POA: Diagnosis not present

## 2018-08-15 DIAGNOSIS — N189 Chronic kidney disease, unspecified: Secondary | ICD-10-CM | POA: Diagnosis not present

## 2018-08-15 DIAGNOSIS — M545 Low back pain: Secondary | ICD-10-CM | POA: Diagnosis not present

## 2018-08-15 DIAGNOSIS — K2270 Barrett's esophagus without dysplasia: Secondary | ICD-10-CM | POA: Diagnosis not present

## 2018-08-15 DIAGNOSIS — I129 Hypertensive chronic kidney disease with stage 1 through stage 4 chronic kidney disease, or unspecified chronic kidney disease: Secondary | ICD-10-CM | POA: Diagnosis not present

## 2018-08-15 DIAGNOSIS — K5730 Diverticulosis of large intestine without perforation or abscess without bleeding: Secondary | ICD-10-CM | POA: Diagnosis not present

## 2018-08-22 DIAGNOSIS — K5730 Diverticulosis of large intestine without perforation or abscess without bleeding: Secondary | ICD-10-CM | POA: Diagnosis not present

## 2018-08-22 DIAGNOSIS — N189 Chronic kidney disease, unspecified: Secondary | ICD-10-CM | POA: Diagnosis not present

## 2018-08-22 DIAGNOSIS — Z9181 History of falling: Secondary | ICD-10-CM | POA: Diagnosis not present

## 2018-08-22 DIAGNOSIS — M810 Age-related osteoporosis without current pathological fracture: Secondary | ICD-10-CM | POA: Diagnosis not present

## 2018-08-22 DIAGNOSIS — K2270 Barrett's esophagus without dysplasia: Secondary | ICD-10-CM | POA: Diagnosis not present

## 2018-08-22 DIAGNOSIS — M170 Bilateral primary osteoarthritis of knee: Secondary | ICD-10-CM | POA: Diagnosis not present

## 2018-08-22 DIAGNOSIS — M545 Low back pain: Secondary | ICD-10-CM | POA: Diagnosis not present

## 2018-08-22 DIAGNOSIS — E039 Hypothyroidism, unspecified: Secondary | ICD-10-CM | POA: Diagnosis not present

## 2018-08-22 DIAGNOSIS — G8929 Other chronic pain: Secondary | ICD-10-CM | POA: Diagnosis not present

## 2018-08-22 DIAGNOSIS — I129 Hypertensive chronic kidney disease with stage 1 through stage 4 chronic kidney disease, or unspecified chronic kidney disease: Secondary | ICD-10-CM | POA: Diagnosis not present

## 2018-08-22 DIAGNOSIS — J449 Chronic obstructive pulmonary disease, unspecified: Secondary | ICD-10-CM | POA: Diagnosis not present

## 2018-08-23 ENCOUNTER — Other Ambulatory Visit: Payer: Self-pay | Admitting: *Deleted

## 2018-08-23 MED ORDER — GABAPENTIN 300 MG PO CAPS: 600.0000 mg | ORAL_CAPSULE | Freq: Every day | ORAL | 1 refills | Status: DC

## 2018-08-23 MED ORDER — LEVOTHYROXINE SODIUM 100 MCG PO TABS: 100.0000 ug | ORAL_TABLET | Freq: Every day | ORAL | 1 refills | Status: DC

## 2018-08-24 DIAGNOSIS — E039 Hypothyroidism, unspecified: Secondary | ICD-10-CM | POA: Diagnosis not present

## 2018-08-24 DIAGNOSIS — J449 Chronic obstructive pulmonary disease, unspecified: Secondary | ICD-10-CM | POA: Diagnosis not present

## 2018-08-24 DIAGNOSIS — M170 Bilateral primary osteoarthritis of knee: Secondary | ICD-10-CM | POA: Diagnosis not present

## 2018-08-24 DIAGNOSIS — K2270 Barrett's esophagus without dysplasia: Secondary | ICD-10-CM | POA: Diagnosis not present

## 2018-08-24 DIAGNOSIS — N189 Chronic kidney disease, unspecified: Secondary | ICD-10-CM | POA: Diagnosis not present

## 2018-08-24 DIAGNOSIS — Z9181 History of falling: Secondary | ICD-10-CM | POA: Diagnosis not present

## 2018-08-24 DIAGNOSIS — M545 Low back pain: Secondary | ICD-10-CM | POA: Diagnosis not present

## 2018-08-24 DIAGNOSIS — G8929 Other chronic pain: Secondary | ICD-10-CM | POA: Diagnosis not present

## 2018-08-24 DIAGNOSIS — I129 Hypertensive chronic kidney disease with stage 1 through stage 4 chronic kidney disease, or unspecified chronic kidney disease: Secondary | ICD-10-CM | POA: Diagnosis not present

## 2018-08-24 DIAGNOSIS — M810 Age-related osteoporosis without current pathological fracture: Secondary | ICD-10-CM | POA: Diagnosis not present

## 2018-08-24 DIAGNOSIS — K5730 Diverticulosis of large intestine without perforation or abscess without bleeding: Secondary | ICD-10-CM | POA: Diagnosis not present

## 2018-08-27 ENCOUNTER — Ambulatory Visit (INDEPENDENT_AMBULATORY_CARE_PROVIDER_SITE_OTHER): Payer: Medicare Other | Admitting: Internal Medicine

## 2018-08-27 ENCOUNTER — Encounter: Payer: Self-pay | Admitting: Internal Medicine

## 2018-08-27 VITALS — BP 129/72 | HR 79 | Temp 98.2°F | Wt 130.2 lb

## 2018-08-27 DIAGNOSIS — K2270 Barrett's esophagus without dysplasia: Secondary | ICD-10-CM

## 2018-08-27 DIAGNOSIS — F329 Major depressive disorder, single episode, unspecified: Secondary | ICD-10-CM

## 2018-08-27 DIAGNOSIS — M545 Low back pain, unspecified: Secondary | ICD-10-CM

## 2018-08-27 DIAGNOSIS — R634 Abnormal weight loss: Secondary | ICD-10-CM

## 2018-08-27 DIAGNOSIS — R300 Dysuria: Secondary | ICD-10-CM | POA: Diagnosis not present

## 2018-08-27 DIAGNOSIS — R630 Anorexia: Secondary | ICD-10-CM

## 2018-08-27 DIAGNOSIS — R8281 Pyuria: Secondary | ICD-10-CM

## 2018-08-27 DIAGNOSIS — F0280 Dementia in other diseases classified elsewhere without behavioral disturbance: Secondary | ICD-10-CM

## 2018-08-27 DIAGNOSIS — Z79899 Other long term (current) drug therapy: Secondary | ICD-10-CM

## 2018-08-27 DIAGNOSIS — R1310 Dysphagia, unspecified: Secondary | ICD-10-CM

## 2018-08-27 DIAGNOSIS — F32A Depression, unspecified: Secondary | ICD-10-CM

## 2018-08-27 DIAGNOSIS — G8929 Other chronic pain: Secondary | ICD-10-CM

## 2018-08-27 DIAGNOSIS — M1990 Unspecified osteoarthritis, unspecified site: Secondary | ICD-10-CM

## 2018-08-27 DIAGNOSIS — Z6821 Body mass index (BMI) 21.0-21.9, adult: Secondary | ICD-10-CM

## 2018-08-27 DIAGNOSIS — G309 Alzheimer's disease, unspecified: Secondary | ICD-10-CM

## 2018-08-27 DIAGNOSIS — K209 Esophagitis, unspecified: Secondary | ICD-10-CM

## 2018-08-27 LAB — POCT URINALYSIS DIPSTICK
Bilirubin, UA: NEGATIVE
Blood, UA: NEGATIVE
Glucose, UA: NEGATIVE
Ketones, UA: NEGATIVE
Nitrite, UA: NEGATIVE
Protein, UA: NEGATIVE
Spec Grav, UA: 1.02 (ref 1.010–1.025)
UROBILINOGEN UA: 0.2 U/dL
pH, UA: 7 (ref 5.0–8.0)

## 2018-08-27 MED ORDER — DICLOFENAC SODIUM 1 % TD GEL: 2.0000 g | Freq: Four times a day (QID) | TRANSDERMAL | 1 refills | Status: AC

## 2018-08-27 MED ORDER — MIRTAZAPINE 15 MG PO TABS: 30.0000 mg | ORAL_TABLET | Freq: Every day | ORAL | 2 refills | Status: DC

## 2018-08-27 NOTE — Progress Notes (Signed)
   CC: dysuria   HPI:  [name] is a 85 y.o. woman with history of arthritis, Barret's esophagus, Alzheimer's dementia who presents for follow-up on her chronic medical conditions. She is accompanied by her daughter who is also concerned that [name] has been having dysuria for the past week and half.   Past Medical History:  Diagnosis Date  . Arthritis 11/2013.    "both knees" (11/26/2013)  . Asthma    "only when I smoked"  . Barrett's esophagus since at least 1985  . CKD (chronic kidney disease)    GFR 55 11/2014   . COPD (chronic obstructive pulmonary disease) ([place])   . Depression with anxiety 11/2013   "little bit"   . Diverticulosis of colon 1996  . Failure to thrive in adult 10/24/2016  . Gallstones   . GAVE (gastric antral vascular ectasia) 01/16/2015  . GERD (gastroesophageal reflux disease)   . Hiatal hernia    s/p gastropexy after failed Nissen fundoplication.   . High cholesterol   . Hx of adenomatous colonic polyps   . Hypertension   . Hypothyroidism   . Pneumonia 11/2013  . Status post dilation of esophageal narrowing    Review of Systems:   General: she has gained 6 pounds since last visit. No fevers or chills.  Cardiac: no chest pain, palpitations, shortness of breath Resp: no cough  GI: minimal improvement in appetite. Odynophagia has improved  GU: no increased urinary frequency or hematuria   Physical Exam:  Vitals:   08/27/18 1515  BP: 129/72  Pulse: 79  Temp: 98.2 F (36.8 C)  TempSrc: Oral  SpO2: 99%  Weight: 130 lb 3.2 oz (59.1 kg)   General: frail pleasantly demented woman sitting in her wheel chair in NAD CV: RRR Pulm: normal respiratory effort; lungs CTAB Abd: BS+; abdomen is soft, non-distended. No suprapubic tenderness  MSK: no edema  Skin: warm and dry  Psych: somewhat blunted affect   Assessment & Plan:   See Encounters Tab for problem based charting.  Patient discussed with Dr. Angelia Mould

## 2018-08-27 NOTE — Patient Instructions (Signed)
Ms. Twiggs,  It was a pleasure meeting you! I look forward to serving as your doctor for the next few years.   I will prescribe you a gel for your back pain that should help. I am also increasing one of your medicines to help increase your appetite.   We are checking your urine today for an infection. I will call in an antibiotic for you if you need one.   Glad to see you are doing well, and we'll see you again in about 3 months.  Please call with any questions or concerns if something comes up.   Take care, Dr. Koleen Distance

## 2018-08-28 ENCOUNTER — Encounter (INDEPENDENT_AMBULATORY_CARE_PROVIDER_SITE_OTHER): Payer: Self-pay | Admitting: Orthopaedic Surgery

## 2018-08-28 ENCOUNTER — Ambulatory Visit (INDEPENDENT_AMBULATORY_CARE_PROVIDER_SITE_OTHER): Payer: Medicare Other | Admitting: Orthopaedic Surgery

## 2018-08-28 VITALS — BP 147/89 | HR 83 | Ht 67.0 in | Wt 130.0 lb

## 2018-08-28 DIAGNOSIS — M1712 Unilateral primary osteoarthritis, left knee: Secondary | ICD-10-CM | POA: Diagnosis not present

## 2018-08-28 DIAGNOSIS — Z87310 Personal history of (healed) osteoporosis fracture: Secondary | ICD-10-CM | POA: Diagnosis not present

## 2018-08-28 DIAGNOSIS — Z86718 Personal history of other venous thrombosis and embolism: Secondary | ICD-10-CM

## 2018-08-28 DIAGNOSIS — Z6820 Body mass index (BMI) 20.0-20.9, adult: Secondary | ICD-10-CM

## 2018-08-28 LAB — URINALYSIS, ROUTINE W REFLEX MICROSCOPIC
Bilirubin, UA: NEGATIVE
Glucose, UA: NEGATIVE
Ketones, UA: NEGATIVE
Nitrite, UA: NEGATIVE
Protein, UA: NEGATIVE
RBC, UA: NEGATIVE
Specific Gravity, UA: 1.022 (ref 1.005–1.030)
Urobilinogen, Ur: 0.2 mg/dL (ref 0.2–1.0)
pH, UA: 6.5 (ref 5.0–7.5)

## 2018-08-28 LAB — MICROSCOPIC EXAMINATION: WBC, UA: >30 /hpf — AB (ref 0–5)

## 2018-08-28 MED ORDER — METHYLPREDNISOLONE ACETATE 40 MG/ML IJ SUSP
40.0000 mg | INTRAMUSCULAR | Status: AC | PRN
  Administered 2018-08-28: 40 mg via INTRA_ARTICULAR

## 2018-08-28 MED ORDER — LIDOCAINE HCL 1 % IJ SOLN
0.5000 mL | INTRAMUSCULAR | Status: AC | PRN
  Administered 2018-08-28: .5 mL

## 2018-08-28 MED ORDER — BUPIVACAINE HCL 0.5 % IJ SOLN
3.0000 mL | INTRAMUSCULAR | Status: AC | PRN
  Administered 2018-08-28: 3 mL via INTRA_ARTICULAR

## 2018-08-28 NOTE — Progress Notes (Signed)
Office Visit Note   Patient: Carolyn [name]           Date of Birth: Dec 14, 1930           MRN: [id number] Visit Date: 08/28/2018              Requested by: Modena Nunnery D, DO [address], [place] [address] PCP: Delice Bison, DO   Assessment & Plan: Visit Diagnoses:  1. Unilateral primary osteoarthritis, left knee     Plan: Knee injection performed which she tolerated well.  We will recheck her back on an as-needed basis.  If she returns with recurrent problems with her knee will obtain standing x-rays of her knee for evaluation.  Follow-Up Instructions: No follow-ups on file.   Orders:  Orders Placed This Encounter  Procedures  . Large Joint Inj   No orders of the defined types were placed in this encounter.     Procedures: Large Joint Inj: L knee on 08/28/2018 3:02 PM Indications: joint swelling and pain Details: 22 G 1.5 in needle, anterolateral approach  Arthrogram: No  Medications: 0.5 mL lidocaine 1 %; 3 mL bupivacaine 0.5 %; 40 mg methylPREDNISolone acetate 40 MG/ML Outcome: tolerated well, no immediate complications Procedure, treatment alternatives, risks and benefits explained, specific risks discussed. Consent was given by the patient. Immediately prior to procedure a time out was called to verify the correct patient, procedure, equipment, support staff and site/side marked as required. Patient was prepped and draped in the usual sterile fashion.       Clinical Data: No additional findings.   Subjective: Chief Complaint  Patient presents with  . Left Knee - Pain    HPI patient returns for ongoing problems with her left knee and requests a repeat injection which was last done 04/25/2018 with good relief for a few months.  She is not had any recent x-rays last few years.  She would like to defer the x-rays on today's visits.  Patient ambulates with a cane.  Review of Systems reviewed updated unchanged from last office visit.   Positive for osteoporosis previous femoral neck fractures.  History of DVT PA-C dementia weight loss chronic back pain.   Objective: Vital Signs: BP (!) 147/89   Pulse 83   Ht 5\' 7"  (1.702 m)   Wt 130 lb (59 kg)   BMI 20.36 kg/m   Physical Exam Constitutional:      Appearance: She is well-developed.  HENT:     Head: Normocephalic.     Right Ear: External ear normal.     Left Ear: External ear normal.  Eyes:     Pupils: Pupils are equal, round, and reactive to light.  Neck:     Thyroid: No thyromegaly.     Trachea: No tracheal deviation.  Cardiovascular:     Rate and Rhythm: Normal rate.  Pulmonary:     Effort: Pulmonary effort is normal.  Abdominal:     Palpations: Abdomen is soft.  Skin:    General: Skin is warm and dry.  Neurological:     Mental Status: She is alert and oriented to person, place, and time.  Psychiatric:        Behavior: Behavior normal.     Ortho Exam patient has crepitus with knee range of motion.  Specialty Comments:  No specialty comments available.  Imaging: No results found.   PMFS History: Patient Active Problem List   Diagnosis Date Noted  . Paraesophageal hernia 08/06/2018  .  Odynophagia 07/02/2018  . Decreased appetite 07/02/2018  . Chronic low back pain 02/21/2018  . Dementia ([place]) 02/20/2018  . Weight loss 06/06/2017  . Need for immunization against influenza 06/06/2017  . Osteoporosis 04/17/2017  . Healthcare maintenance 02/23/2016  . OAB (overactive bladder) 02/02/2016  . Hyperlipidemia 12/08/2015  . Nonrheumatic aortic valve insufficiency 10/19/2015  . PAC (premature atrial contraction) 09/09/2015  . History of DVT (deep vein thrombosis) 10/02/2014  . Barrett's esophagus   . Fracture of femoral neck, right ([place]) 02/07/2014  . Hypothyroidism 02/07/2014  . Essential hypertension 02/07/2014   Past Medical History:  Diagnosis Date  . Arthritis 11/2013.    "both knees" (11/26/2013)  . Asthma    "only when I smoked"  .  Barrett's esophagus since at least 1985  . CKD (chronic kidney disease)    GFR 55 11/2014   . COPD (chronic obstructive pulmonary disease) ([place])   . Depression with anxiety 11/2013   "little bit"   . Diverticulosis of colon 1996  . Failure to thrive in adult 10/24/2016  . Gallstones   . GAVE (gastric antral vascular ectasia) 01/16/2015  . GERD (gastroesophageal reflux disease)   . Hiatal hernia    s/p gastropexy after failed Nissen fundoplication.   . High cholesterol   . Hx of adenomatous colonic polyps   . Hypertension   . Hypothyroidism   . Pneumonia 11/2013  . Status post dilation of esophageal narrowing     Family History  Problem Relation Age of Onset  . Heart disease Mother   . Stomach cancer Mother   . Heart disease Father   . Heart disease Brother   . Bladder Cancer Sister   . Heart disease Brother     Past Surgical History:  Procedure Laterality Date  . ABDOMINAL HYSTERECTOMY    . ANTERIOR APPROACH HEMI HIP ARTHROPLASTY Right 01/15/2016   Procedure: ANTERIOR APPROACH HEMI HIP ARTHROPLASTY;  Surgeon: Mcarthur Rossetti, MD;  [facility];  Service: Orthopedics;  Laterality: Right;  . APPENDECTOMY    . BLADDER SUSPENSION     .  bladder tack x 3.   . CATARACT EXTRACTION W/ INTRAOCULAR LENS  IMPLANT, BILATERAL Bilateral   . CHOLECYSTECTOMY    . COLONOSCOPY N/A 01/16/2015   Procedure: COLONOSCOPY;  Surgeon: Gatha Mayer, MD;  Location: [facility];  Service: Endoscopy;  Laterality: N/A;  . ENTEROCELE REPAIR  2003   with colopexy.   . ESOPHAGOGASTRODUODENOSCOPY N/A 09/01/2014   Procedure: ESOPHAGOGASTRODUODENOSCOPY (EGD);  Surgeon: Lafayette Dragon, MD;  Location: [facility];  Service: Endoscopy;  Laterality: N/A;  . ESOPHAGOGASTRODUODENOSCOPY N/A 01/16/2015   Procedure: ESOPHAGOGASTRODUODENOSCOPY (EGD);  Surgeon: Gatha Mayer, MD;  Location: [facility] ENDOSCOPY;  Service: Endoscopy;  Laterality: N/A;  . gastropexy  1989  . HIP ARTHROPLASTY Left 02/07/2014   Procedure:  LEFT HIP PRESS FIT MONOPOLAR HEMIARTHROPLASTY ;  Surgeon: Marybelle Killings, MD;  Location: [facility];  Service: Orthopedics;  Laterality: Left;  . NISSEN FUNDOPLICATION     this ultimately failed and she underwent gastropexy   Social History   Occupational History  . Occupation: Retired    Fish farm manager: [employer]  Tobacco Use  . Smoking status: Former Smoker    Packs/day: 0.25    Years: 40.00    Pack years: 10.00    Types: Cigarettes  . Smokeless tobacco: Never Used  . Tobacco comment: "quit smoking ~ 2005" but was on/off during that time  Substance and Sexual Activity  . Alcohol use: No  Alcohol/week: 0.0 standard drinks    Comment: 11/26/2013 "drank a little alcohol; last drink was a long time ago"  . Drug use: No  . Sexual activity: Never

## 2018-08-29 ENCOUNTER — Encounter: Payer: Self-pay | Admitting: Internal Medicine

## 2018-08-29 DIAGNOSIS — K5730 Diverticulosis of large intestine without perforation or abscess without bleeding: Secondary | ICD-10-CM | POA: Diagnosis not present

## 2018-08-29 DIAGNOSIS — G8929 Other chronic pain: Secondary | ICD-10-CM | POA: Diagnosis not present

## 2018-08-29 DIAGNOSIS — K2270 Barrett's esophagus without dysplasia: Secondary | ICD-10-CM | POA: Diagnosis not present

## 2018-08-29 DIAGNOSIS — M545 Low back pain: Secondary | ICD-10-CM | POA: Diagnosis not present

## 2018-08-29 DIAGNOSIS — N189 Chronic kidney disease, unspecified: Secondary | ICD-10-CM | POA: Diagnosis not present

## 2018-08-29 DIAGNOSIS — R300 Dysuria: Secondary | ICD-10-CM | POA: Insufficient documentation

## 2018-08-29 DIAGNOSIS — I129 Hypertensive chronic kidney disease with stage 1 through stage 4 chronic kidney disease, or unspecified chronic kidney disease: Secondary | ICD-10-CM | POA: Diagnosis not present

## 2018-08-29 DIAGNOSIS — F32A Depression, unspecified: Secondary | ICD-10-CM | POA: Insufficient documentation

## 2018-08-29 DIAGNOSIS — F329 Major depressive disorder, single episode, unspecified: Secondary | ICD-10-CM | POA: Insufficient documentation

## 2018-08-29 DIAGNOSIS — Z9181 History of falling: Secondary | ICD-10-CM | POA: Diagnosis not present

## 2018-08-29 DIAGNOSIS — E039 Hypothyroidism, unspecified: Secondary | ICD-10-CM | POA: Diagnosis not present

## 2018-08-29 DIAGNOSIS — M810 Age-related osteoporosis without current pathological fracture: Secondary | ICD-10-CM | POA: Diagnosis not present

## 2018-08-29 DIAGNOSIS — M170 Bilateral primary osteoarthritis of knee: Secondary | ICD-10-CM | POA: Diagnosis not present

## 2018-08-29 DIAGNOSIS — J449 Chronic obstructive pulmonary disease, unspecified: Secondary | ICD-10-CM | POA: Diagnosis not present

## 2018-08-29 NOTE — Assessment & Plan Note (Signed)
Cymbalta was stopped at last visit, and she was initiated on Mirtazapine. Additionally, her Mobic was switched to Celecoxib in order to minimize GI side effects.  Patient has gained 6 lbs since last visit. However, daughter notes minimal improvement in appetite. Another medication list review was done at today's visit. PPI's can certainly decrease appetite, but she will need to continue therapy based on EGD findings.  We will increase Mirtazapine to 30 mg today and continue to monitor.

## 2018-08-29 NOTE — Assessment & Plan Note (Signed)
Patient's daughter notes this has been an ongoing problem for her and questions if there is anything to add to her regimen.  Will try prescription for Voltaren gel in addition to continuing Celecoxib.

## 2018-08-29 NOTE — Assessment & Plan Note (Signed)
Patient's U/A today has some leukocytes, but is nitrite negative and with few bacteria. Given that she is afebrile and non-tender on exam without current symptoms, will not initiate treatment.

## 2018-08-29 NOTE — Assessment & Plan Note (Signed)
Patient s/p EGD on 1/14 which showed esophagitis. She was continued on twice daily PPI, as well as Carafate which seems to have improved the odynophagia.

## 2018-08-30 NOTE — Progress Notes (Addendum)
Internal Medicine Clinic Attending  Case discussed with Dr. Koleen Distance at the time of the visit.  We reviewed the resident's history and exam and pertinent patient test results.  I agree with the assessment, diagnosis, and plan of care documented in the resident's note.    Given her dysuria with UA findings of pyuria I would treat for acute cystitis with a 3 day course of bactrim or 5 day course of macrobid.

## 2018-09-03 ENCOUNTER — Other Ambulatory Visit: Payer: Self-pay | Admitting: Internal Medicine

## 2018-09-03 DIAGNOSIS — R300 Dysuria: Secondary | ICD-10-CM

## 2018-09-03 MED ORDER — NITROFURANTOIN MONOHYD MACRO 100 MG PO CAPS: 100.0000 mg | ORAL_CAPSULE | Freq: Two times a day (BID) | ORAL | 0 refills | Status: AC

## 2018-09-04 DIAGNOSIS — G8929 Other chronic pain: Secondary | ICD-10-CM | POA: Diagnosis not present

## 2018-09-04 DIAGNOSIS — M545 Low back pain: Secondary | ICD-10-CM | POA: Diagnosis not present

## 2018-09-04 DIAGNOSIS — K2270 Barrett's esophagus without dysplasia: Secondary | ICD-10-CM | POA: Diagnosis not present

## 2018-09-04 DIAGNOSIS — M810 Age-related osteoporosis without current pathological fracture: Secondary | ICD-10-CM | POA: Diagnosis not present

## 2018-09-04 DIAGNOSIS — Z9181 History of falling: Secondary | ICD-10-CM | POA: Diagnosis not present

## 2018-09-04 DIAGNOSIS — E039 Hypothyroidism, unspecified: Secondary | ICD-10-CM | POA: Diagnosis not present

## 2018-09-04 DIAGNOSIS — I129 Hypertensive chronic kidney disease with stage 1 through stage 4 chronic kidney disease, or unspecified chronic kidney disease: Secondary | ICD-10-CM | POA: Diagnosis not present

## 2018-09-04 DIAGNOSIS — N189 Chronic kidney disease, unspecified: Secondary | ICD-10-CM | POA: Diagnosis not present

## 2018-09-04 DIAGNOSIS — K5730 Diverticulosis of large intestine without perforation or abscess without bleeding: Secondary | ICD-10-CM | POA: Diagnosis not present

## 2018-09-04 DIAGNOSIS — J449 Chronic obstructive pulmonary disease, unspecified: Secondary | ICD-10-CM | POA: Diagnosis not present

## 2018-09-04 DIAGNOSIS — M170 Bilateral primary osteoarthritis of knee: Secondary | ICD-10-CM | POA: Diagnosis not present

## 2018-09-06 DIAGNOSIS — M545 Low back pain: Secondary | ICD-10-CM | POA: Diagnosis not present

## 2018-09-06 DIAGNOSIS — E039 Hypothyroidism, unspecified: Secondary | ICD-10-CM | POA: Diagnosis not present

## 2018-09-06 DIAGNOSIS — I129 Hypertensive chronic kidney disease with stage 1 through stage 4 chronic kidney disease, or unspecified chronic kidney disease: Secondary | ICD-10-CM | POA: Diagnosis not present

## 2018-09-06 DIAGNOSIS — M170 Bilateral primary osteoarthritis of knee: Secondary | ICD-10-CM | POA: Diagnosis not present

## 2018-09-06 DIAGNOSIS — K5730 Diverticulosis of large intestine without perforation or abscess without bleeding: Secondary | ICD-10-CM | POA: Diagnosis not present

## 2018-09-06 DIAGNOSIS — G8929 Other chronic pain: Secondary | ICD-10-CM | POA: Diagnosis not present

## 2018-09-06 DIAGNOSIS — J449 Chronic obstructive pulmonary disease, unspecified: Secondary | ICD-10-CM | POA: Diagnosis not present

## 2018-09-06 DIAGNOSIS — M810 Age-related osteoporosis without current pathological fracture: Secondary | ICD-10-CM | POA: Diagnosis not present

## 2018-09-06 DIAGNOSIS — K2270 Barrett's esophagus without dysplasia: Secondary | ICD-10-CM | POA: Diagnosis not present

## 2018-09-06 DIAGNOSIS — Z9181 History of falling: Secondary | ICD-10-CM | POA: Diagnosis not present

## 2018-09-06 DIAGNOSIS — N189 Chronic kidney disease, unspecified: Secondary | ICD-10-CM | POA: Diagnosis not present

## 2018-09-19 NOTE — Telephone Encounter (Signed)
Received community message from [place] that [facility] was 08/08/2018. Hubbard Hartshorn, RN, BSN

## 2018-11-16 ENCOUNTER — Telehealth: Payer: Self-pay | Admitting: *Deleted

## 2018-11-16 NOTE — Telephone Encounter (Signed)
Pt's daughter calls and states that she came to stay the night and visit with mother and brother. She states pt was c/o swelling in her hands/ fingers. Daughter states hands/ fingers are swollen noticeably arms are not. They are warm to touch, not hot but they are usually cool. No other swelling noted. She is voiding as usual. She does not eat/ drink a lot anymore but has taken ensure today and drinking iced tea. Pt denies anything abnormal, such as pain, n&v, chest pain, short of breath. Daughter is concerned because this has never happened before. Pt is able to do with hands as she desires, holding her head up with hands at this time. She pivots to a chair on her own and has done that today.  Also daughter would like to check into getting services [facility], she is making contact with them pt's dementia is getting worse and she feels this may be a great help since she works full time and her brother is the main caregiver.  Will send to dr bloomfield, attending for advise  [phone] sandra

## 2018-11-19 ENCOUNTER — Ambulatory Visit (INDEPENDENT_AMBULATORY_CARE_PROVIDER_SITE_OTHER): Payer: Medicare Other | Admitting: Internal Medicine

## 2018-11-19 ENCOUNTER — Encounter: Payer: Self-pay | Admitting: Internal Medicine

## 2018-11-19 ENCOUNTER — Other Ambulatory Visit: Payer: Self-pay

## 2018-11-19 DIAGNOSIS — F32A Depression, unspecified: Secondary | ICD-10-CM

## 2018-11-19 DIAGNOSIS — M545 Low back pain: Secondary | ICD-10-CM

## 2018-11-19 DIAGNOSIS — R630 Anorexia: Secondary | ICD-10-CM

## 2018-11-19 DIAGNOSIS — M159 Polyosteoarthritis, unspecified: Secondary | ICD-10-CM | POA: Insufficient documentation

## 2018-11-19 DIAGNOSIS — M7989 Other specified soft tissue disorders: Secondary | ICD-10-CM | POA: Diagnosis not present

## 2018-11-19 DIAGNOSIS — G8929 Other chronic pain: Secondary | ICD-10-CM

## 2018-11-19 DIAGNOSIS — R634 Abnormal weight loss: Secondary | ICD-10-CM

## 2018-11-19 DIAGNOSIS — F0390 Unspecified dementia without behavioral disturbance: Secondary | ICD-10-CM

## 2018-11-19 DIAGNOSIS — F329 Major depressive disorder, single episode, unspecified: Secondary | ICD-10-CM

## 2018-11-19 MED ORDER — CELECOXIB 200 MG PO CAPS: 200.0000 mg | ORAL_CAPSULE | Freq: Two times a day (BID) | ORAL | 2 refills | Status: AC

## 2018-11-19 MED ORDER — MIRTAZAPINE 15 MG PO TABS: 30.0000 mg | ORAL_TABLET | Freq: Every day | ORAL | 1 refills | Status: DC

## 2018-11-19 MED ORDER — DICLOFENAC SODIUM 1 % TD GEL: 4.0000 g | Freq: Four times a day (QID) | TRANSDERMAL | 12 refills | Status: AC

## 2018-11-19 NOTE — Progress Notes (Signed)
  [facility] Internal Medicine Residency Telephone Encounter Continuity Care Appointment  HPI:   This telephone encounter was created for [name] on 11/19/2018 for the following purpose/cc: bilateral hand swelling, Dementia     Past Medical History:  Past Medical History:  Diagnosis Date  . Arthritis 11/2013.    "both knees" (11/26/2013)  . Asthma    "only when I smoked"  . Barrett's esophagus since at least 1985  . CKD (chronic kidney disease)    GFR 55 11/2014   . COPD (chronic obstructive pulmonary disease) ([place])   . Depression with anxiety 11/2013   "little bit"   . Diverticulosis of colon 1996  . Failure to thrive in adult 10/24/2016  . Gallstones   . GAVE (gastric antral vascular ectasia) 01/16/2015  . GERD (gastroesophageal reflux disease)   . Hiatal hernia    s/p gastropexy after failed Nissen fundoplication.   . High cholesterol   . Hx of adenomatous colonic polyps   . Hypertension   . Hypothyroidism   . Pneumonia 11/2013  . Status post dilation of esophageal narrowing       ROS:   Denies fevers, chills, other joint swelling, erythema, rash.    Assessment / Plan / Recommendations:   Please see A&P under problem oriented charting for assessment of the patient's acute and chronic medical conditions.   As always, pt is advised that if symptoms worsen or new symptoms arise, they should go to an urgent care facility or to to ER for further evaluation.   Consent and Medical Decision Making:   Patient discussed with Dr. Dareen Piano  This is a telephone encounter between Carolyn [name] and Delice Bison on 11/19/2018 for bilateral hand swelling, Dementia. The visit was conducted with the patient located at home and Delice Bison at [facility]. The patient's identity was confirmed using their DOB and current address. The his/her legal guardian has consented to being evaluated through a telephone encounter and understands the associated risks (an examination  cannot be done and the patient may need to come in for an appointment) / benefits (allows the patient to remain at home, decreasing exposure to coronavirus). I personally spent 20 minutes on medical discussion.

## 2018-11-19 NOTE — Assessment & Plan Note (Signed)
Discussed with patient's daughter regarding her mother's continued decline. She has become concerned for her safety living alone. Based on our discussion, I agree that we have reached a time where she needs to be living with someone to have more supervision and care. Katharine Look plans on having her mother move in to live with her.

## 2018-11-19 NOTE — Telephone Encounter (Signed)
I would set up a telehealth visit for her so we can discuss this.

## 2018-11-19 NOTE — Assessment & Plan Note (Signed)
Symptoms seem most consistent with arthritis. She has already seen some improvement with ice and elevation. No erythema, fevers, chills, or one single joint that is swollen out of proportion compared to others. She is already on Celebrex daily. Encouraged her to continue using ice, elevation and topical Voltaren gel. Instructed to call if she develops fever, noticed increased redness or swelling in one joint.

## 2018-11-19 NOTE — Telephone Encounter (Signed)
Patient has telehealth appt today with PCP at 2:45. Hubbard Hartshorn, RN, BSN

## 2018-11-20 ENCOUNTER — Telehealth: Payer: Self-pay

## 2018-11-20 MED ORDER — SUCRALFATE 1 GM/10ML PO SUSP: 1.0000 g | Freq: Four times a day (QID) | ORAL | 1 refills | Status: DC

## 2018-11-20 NOTE — Telephone Encounter (Signed)
Patient's daughter reports that she is having painful swallowing as well as dysphagia again.  She is having episodes of regurgitation even with liquids. "Just won't go down"   Daughter reports that she is really only eating one meal a day now, but is now having difficulty with that.  She is able to get 1-2 cans of Glucerna in her daily.  She is not sure if she is having weight loss at this time.  She is asking about trying carafate again?

## 2018-11-20 NOTE — Telephone Encounter (Signed)
I recommend:  1) High dose PPI so would have her take omeprazole 40 mg bid 2) Carafate 1 g tid ac and hs - if they can get the slurry - great if not may need to dissolve the tablet in tbsp of water 3) Reflux precautions (HOB up, etc) 4) F/U visit in 7-10 days by phone please - if worse before then they need to call back

## 2018-11-20 NOTE — Telephone Encounter (Signed)
Mom notified rx sent Follow up arranged for 12/03/18

## 2018-11-20 NOTE — Progress Notes (Signed)
Internal Medicine Clinic Attending  Case discussed with Dr. Bloomfield at the time of the visit.  We reviewed the resident's history and exam and pertinent patient test results.  I agree with the assessment, diagnosis, and plan of care documented in the resident's note.  

## 2018-11-24 ENCOUNTER — Other Ambulatory Visit: Payer: Self-pay | Admitting: Internal Medicine

## 2018-11-24 DIAGNOSIS — F329 Major depressive disorder, single episode, unspecified: Secondary | ICD-10-CM

## 2018-11-24 DIAGNOSIS — R630 Anorexia: Secondary | ICD-10-CM

## 2018-11-24 DIAGNOSIS — R634 Abnormal weight loss: Secondary | ICD-10-CM

## 2018-11-24 DIAGNOSIS — F32A Depression, unspecified: Secondary | ICD-10-CM

## 2018-11-26 ENCOUNTER — Telehealth: Payer: Self-pay

## 2018-11-26 NOTE — Telephone Encounter (Signed)
Pt's daughter requesting to speak with a nurse. Please call back.  

## 2018-11-26 NOTE — Telephone Encounter (Signed)
Patient's daughter given info below. She is very Patent attorney.  Also, she has learned that [facility] will be able to continue to see patient at new address. Hubbard Hartshorn, RN, BSN

## 2018-11-26 NOTE — Telephone Encounter (Signed)
Please reassure daughter that hearing a few crackles in the lungs is non specific and does does require immediate attention. If patient develops progressive cough, fever, or dyspnea, call back.

## 2018-11-26 NOTE — Telephone Encounter (Signed)
Returned call to daughter. States patient now resides with her in [facility]. Is wondering if [facility] will be able to see her at new address. RN from [facility] has made contact with patient. Daughter will call their office to see if they can see patient at new address.  Also, states patient has slight non-productive cough and continued bilat hand and feet edema. States she had a friend visit yesterday who is an ED Therapist, sports. States this RN assessed patient with following findings:   Right lung clear Left lung with diminished crackles in LLL "Slight" pitting edema in right arm and right foot (not enough to number)  Denies, fever, chills, redness on hands or feet. Has continued to use ice, elevate, take celebrex and use voltaren gel. Daughter is concerned about crackles in LLL and wants to know what she should do. Will forward to Attending. PCP unavailable at this time. Hubbard Hartshorn, RN, BSN

## 2018-11-27 NOTE — Telephone Encounter (Signed)
Noted thanks °

## 2018-11-29 ENCOUNTER — Encounter: Payer: Self-pay | Admitting: General Surgery

## 2018-12-03 ENCOUNTER — Ambulatory Visit (INDEPENDENT_AMBULATORY_CARE_PROVIDER_SITE_OTHER): Payer: Medicare Other | Admitting: Internal Medicine

## 2018-12-03 ENCOUNTER — Encounter: Payer: Self-pay | Admitting: Internal Medicine

## 2018-12-03 ENCOUNTER — Other Ambulatory Visit: Payer: Self-pay

## 2018-12-03 VITALS — Ht 68.0 in | Wt 130.0 lb

## 2018-12-03 DIAGNOSIS — R1310 Dysphagia, unspecified: Secondary | ICD-10-CM | POA: Diagnosis not present

## 2018-12-03 DIAGNOSIS — R1319 Other dysphagia: Secondary | ICD-10-CM | POA: Insufficient documentation

## 2018-12-03 DIAGNOSIS — K2100 Gastro-esophageal reflux disease with esophagitis, without bleeding: Secondary | ICD-10-CM

## 2018-12-03 DIAGNOSIS — M159 Polyosteoarthritis, unspecified: Secondary | ICD-10-CM | POA: Diagnosis not present

## 2018-12-03 DIAGNOSIS — K449 Diaphragmatic hernia without obstruction or gangrene: Secondary | ICD-10-CM | POA: Diagnosis not present

## 2018-12-03 DIAGNOSIS — F0390 Unspecified dementia without behavioral disturbance: Secondary | ICD-10-CM

## 2018-12-03 MED ORDER — SUCRALFATE 1 G PO TABS: 1.0000 g | ORAL_TABLET | Freq: Three times a day (TID) | ORAL | 3 refills | Status: AC

## 2018-12-03 MED ORDER — FAMOTIDINE 20 MG PO TABS: 40.0000 mg | ORAL_TABLET | Freq: Every day | ORAL | Status: AC

## 2018-12-03 NOTE — Assessment & Plan Note (Signed)
I think this may be improving now that she is living with her daughter.  We will try to more aggressively treat her reflux.  There does not seem to be a role for esophageal dilation at this point this is multifactorial with her paraesophageal hiatal hernia and reflux.  Bite sized versus minced and moist diet.  Instructions provided.

## 2018-12-03 NOTE — Assessment & Plan Note (Signed)
Not a candidate for repair.  This was noted on 2016 upper GI.  When she has had EGDs is not been that apparent.

## 2018-12-03 NOTE — Progress Notes (Signed)
  [facility] Internal [facility] Residency Telephone Encounter Continuity Care Appointment  HPI:   This telephone encounter was created for Ms. [name] on 12/03/2018 for the following purpose/cc OA, Dementia.   Past Medical History:  Past Medical History:  Diagnosis Date  . Arthritis 11/2013.    "both knees" (11/26/2013)  . Asthma    "only when I smoked"  . Barrett's esophagus since at least 1985  . CKD (chronic kidney disease)    GFR 55 11/2014   . COPD (chronic obstructive pulmonary disease) ([place])   . Depression with anxiety 11/2013   "little bit"   . Diverticulosis of colon 1996  . Failure to thrive in adult 10/24/2016  . Gallstones   . GAVE (gastric antral vascular ectasia) 01/16/2015  . GERD (gastroesophageal reflux disease)   . Hiatal hernia    s/p gastropexy after failed Nissen fundoplication.   . High cholesterol   . Hx of adenomatous colonic polyps   . Hypertension   . Hypothyroidism   . Pneumonia 11/2013  . Status post dilation of esophageal narrowing       ROS:   Negative for fever, chills, shortness of breath, rash or skin changes. No appetite changes, n/v, urinary symptoms.    Assessment / Plan / Recommendations:   Please see A&P under problem oriented charting for assessment of the patient's acute and chronic medical conditions.   As always, pt is advised that if symptoms worsen or new symptoms arise, they should go to an urgent care facility or to to ER for further evaluation.   Consent and Medical Decision Making:   Patient discussed with Dr. Dareen Piano  This is a telephone encounter between [name] and Delice Bison on 12/03/2018 for OA, Dementia. The visit was conducted with the patient located at home and Delice Bison at [facility]. The patient's identity was confirmed using their DOB and current address. The his/her legal guardian has consented to being evaluated through a telephone encounter and understands the associated risks (an  examination cannot be done and the patient may need to come in for an appointment) / benefits (allows the patient to remain at home, decreasing exposure to coronavirus). I personally spent 20 minutes on medical discussion.

## 2018-12-03 NOTE — Patient Instructions (Addendum)
Carolyn [name], it was good to speak to you about your mom today.  Here are the things we reviewed as far as her treatment.  Continue taking the omeprazole 20 mg, 2 at a time before breakfast but move the second dose to before supper or her last meal time.  About 30 minutes before is ideal if you can do that.  I am sending in a prescription for Carafate tablets which can be crushed into about a tablespoon of water and swallowed that way and it might work as well as the slurry and will be a lot cheaper I think.  Take that 3 times a day and at bedtime, generally with meals on those first 3 times.  Purchase over-the-counter famotidine or Pepcid and take to 20 mg tablets at bedtime.  If you want me to write a prescription for this we can do that it could be cheaper I just do not know.  Find a wedge for her to sleep on you can search [place] or go to a medical supply store for a medical wedge to treat reflux.  I am including a dysphagia 3 diet or bite sized and these instructions it sounds like you are working on something similar to that right now but it might be helpful information.I have also included the minced and moist foods which is a more severe restriction but also helpful information.  Medical therapy is the best we can do right now, and really the only thing we can do unless she developed scar tissue in the esophagus.  If her swallowing gets worse we could reinvestigate but at this point discontinue the medical therapy and these other treatments.  I appreciate the opportunity to care for you. Gatha Mayer, MD, [facility]  Dysphagia Eating Plan, Bite Size Food This diet plan is for people with moderate swallowing problems who have transitioned from pureed and minced foods. Bite size foods are soft and cut into small chunks so that they can be swallowed safely. On this eating plan, you may be instructed to drink liquids that are thickened. Work with your health care provider and your diet and nutrition  specialist (dietitian) to make sure that you are following the diet safely and getting all the nutrients you need. What are tips for following this plan? General guidelines for foods   You may eat foods that are tender, soft, and moist.  Always test food texture before taking a bite. Poke food with a fork or spoon to make sure it is tender.  Food should be easy to cut and shew. Avoid large pieces of food that require a lot of chewing.  Take small bites. Each bite should be smaller than your thumb nail (about 14mm by 15 mm).  If you were on pureed and minced food diet plans, you may eat any of the foods included in those diets.  Avoid foods that are very dry, hard, sticky, chewy, coarse, or crunchy.  If instructed by your health care provider, thicken liquids. Follow your health care provider's instructions for what products to use, how to do this, and to what thickness. ? Your health care provider may recommend using a commercial thickener, rice cereal, or potato flakes. Ask your health care provider to recommend thickeners. ? Thickened liquids are usually a "pudding-like" consistency, or they may be as thick as honey or thick enough to eat with a spoon. Cooking  To moisten foods, you may add liquids while you are blending, mashing, or grinding your foods to  the right consistency. These liquids include gravies, sauces, vegetable or fruit juice, milk, half and half, or water.  Strain extra liquid from foods before eating.  Reheat foods slowly to prevent a tough crust from forming.  Prepare foods in advance. Meal planning  Eat a variety of foods to get all the nutrients you need.  Some foods may be tolerated better than others. Work with your dietitian to identify which foods are safest for you to eat.  Follow your meal plan as told by your dietitian. What foods are allowed? Grains Moist breads without nuts or seeds. Biscuits, muffins, pancakes, and waffles that are well-moistened  with syrup, jelly, margarine, or butter. Cooked cereals. Moist bread stuffing. Moist rice. Well-moistened cold cereal with small chunks. Well-cooked pasta, noodles, rice, and bread dressing in small pieces and thick sauce. Soft dumplings or spaetzle in small pieces and butter or gravy. Vegetables Soft, well-cooked vegetables in small pieces. Soft-cooked, mashed potatoes. Thickened vegetable juice. Fruits Canned or cooked fruits that are soft or moist and do not have skin or seeds. Fresh, soft bananas. Thickened fruit juices. Meat and other protein foods Tender, moist meats or poultry in small pieces. Moist meatballs or meatloaf. Fish without bones. Eggs or egg substitutes in small pieces. Tofu. Tempeh and meat alternatives in small pieces. Well-cooked, tender beans, peas, baked beans, and other legumes. Dairy Thickened milk. Cream cheese. Yogurt. Cottage cheese. Sour cream. Small pieces of soft cheese. Fats and oils Butter. Oils. Margarine. Mayonnaise. Gravy. Spreads. Sweets and desserts Soft, smooth, moist desserts. Pudding. Custard. Moist cakes. Jam. Jelly. Honey. Preserves. Ask your health care provider whether you can have frozen desserts. Seasoning and other foods All seasonings and sweeteners. All sauces with small chunks. Prepared tuna, egg, or chicken salad without raw fruits or vegetables. Moist casseroles with small, tender pieces of meat. Soups with tender meat. What foods are not allowed? Grains Coarse or dry cereals. Dry breads. Toast. Crackers. Tough, crusty breads, such as [place] bread and baguettes. Dry pancakes, waffles, and muffins. Sticky rice. Dry bread stuffing. Granola. Popcorn. Chips. Vegetables All raw vegetables. Cooked corn. Rubbery or stiff cooked vegetables. Stringy vegetables, such as celery. Tough, crisp fried potatoes. Potato skins. Fruits Hard, crunchy, stringy, high-pulp, and juicy raw fruits such as apples, pineapple, papaya, and watermelon. Small, round fruits,  such as grapes. Dried fruit and fruit leather. Meat and other protein foods Large pieces of meat. Dry, tough meats, such as bacon, sausage, and hot dogs. Chicken, [place], or fish with skin and bones. Crunchy peanut butter. Nuts. Seeds. Nut and seed butters. Dairy Yogurt with nuts, seeds, or large chunks. Large chunks of cheese. Frozen desserts and milk consistency not allowed by your dietitian. Sweets and desserts Dry cakes. Chewy or dry cookies. Any desserts with nuts, seeds, dry fruits, coconut, pineapple, or anything dry, sticky, or hard. Chewy caramel. Licorice. Taffy-type candies. Ask your health care provider whether you can have frozen desserts. Seasoning and other foods Soups with tough or large chunks of meats, poultry, or vegetables. Corn or clam chowder. Smoothies with large chunks of fruit. Summary  Bite size foods can be helpful for people with moderate swallowing problems.  On the dysphagia eating plan, you may eat foods that are soft, moist, and cut into pieces smaller than 23mm by 48mm.  You may be instructed to thicken liquids. Follow your health care provider's instructions about how to do this and to what consistency. This information is not intended to replace advice given to you by  your health care provider. Make sure you discuss any questions you have with your health care provider. Document Released: 07/11/2005 Document Revised: 10/21/2016 Document Reviewed: 10/21/2016 Elsevier Interactive Patient Education  2019 [employer] Inc[employer]  Dysphagia Eating Plan, Minced and Moist Foods This eating plan is for people with moderate swallowing problems who are transitioning from pureed to solid foods. Moist and minced foods are soft and cut into very small chunks so that they can be swallowed safely. On this eating plan, you may be instructed to drink liquids that are thickened. Work with your health care provider and your diet and nutrition specialist (dietitian) to make sure that you  are following the diet safely and getting all the nutrients you need. What are tips for following this plan? General guidelines for foods   You may eat foods that are soft and moist.  Always test food texture before taking a bite. Poke food with a fork or spoon to make sure it is tender.  Take small bites. Each bite should be smaller than your little finger nail (about 4 mm by 4 mm).  If you were on a pureed food eating plan, you may still eat any of the foods included in that diet.  Avoid foods that are dry, hard, sticky, chewy, coarse, or crunchy.  Avoid foods that separate into thin liquids and solids, such as cereal with milk or chunky soups.  Avoid liquids that have seeds or chunks.  If instructed by your health care provider, thicken liquids. Follow your health care provider's instructions about what products to use, how to do this, and to what thickness. ? You may use a commercial thickener, rice cereal, or potato flakes. ? Thickened liquids are usually a "pudding-like" consistency, or they may be as thick as honey or thick enough to eat with a spoon. Cooking  You may need to use a blender, whisk, or masher to soften some of your foods.  To moisten foods, you may add liquids while you are blending, mashing, or grinding your foods to the right consistency. These liquids include gravies, sauces, vegetable or fruit juice, milk, half and half, or water.  Reheat foods slowly to prevent a tough crust from forming. Meal planning  Eat a variety of foods in order to get all the nutrients you need.  Follow your meal plan as told by your health care provider or dietitian. What foods are allowed? Grains Soaked soft breads without nuts or seeds. Pancakes, sweet rolls, pastries, and [place] toast that have been moistened with syrup or sauce. Well-cooked pasta, noodles, rice, and bread dressing in very small pieces and thick sauce. Soft dumplings or spaetzle in very small pieces and butter  or gravy. Soft-cooked cereals. Vegetables Very soft, well-cooked vegetables in very small pieces. Soft-cooked, mashed potatoes. Thickened vegetable juice. Fruits Canned or cooked fruits that are soft or moist and do not have skin or seeds. Fresh, soft bananas. Thickened fruit juices. Meat and other protein foods Tender, moist, and finely minced or ground meats or poultry. Moist meatballs or meatloaf. Fish without bones. Scrambled, poached, or soft-cooked eggs. Tofu. Tempeh and meat alternatives in very small pieces. Well-cooked, moistened and mashed beans, baked beans, peas, and other legumes. Dairy Thickened milk. Cream cheese. Yogurt. Cottage cheese. Sour cream. Fats and oils Butter. Margarine. Cream for cereal, depending on liquid consistency allowed. Gravy. Cream sauces. Mayonnaise. Sweets and desserts Pudding. Custard. Ice cream and sherbet. Whipped toppings. Soft, moist cakes. Icing. Jelly. Jams and preserves without seeds. Seasoning and  other foods Sauces and salsas that have soft chunks that are smaller than 32mm. Salad dressings. Casseroles with small pieces of tender meat. All seasonings and sweeteners. Beverages Anything prepared at the thickness recommended by your dietitian. What foods are not allowed? Grains Breads that are hard or have nuts or seeds. Dry biscuits, pancakes, waffles, and bread dressing. Coarse cereals. Cereals that have nuts, seeds, dried fruits, or coconut. Sticky rice. Large pieces of pasta. Vegetables All raw vegetables. Tough, fibrous, chewy, or stringy cooked vegetables, such as celery, peas, broccoli, cabbage, Brussels sprouts, and asparagus. Potato skins. Potato and other vegetable chips. Fried or French-fried potatoes. Cooked corn and peas. Fruits Hard, crunchy, stringy, high-pulp, and juicy raw fruits such as apples, pineapple, papaya, and watermelon. Fruits with skins and seeds, such as grapes. Dried fruit and fruit leather. Meats and other protein  foods Large pieces of meat. Dry, tough meats, such as bacon, sausage, and hot dogs. Chicken, [place], or fish with skin and bones. Crunchy peanut butter. Nuts. Seeds. Dairy Yogurt with nuts, seeds, or large chunks. Large chunks of cheese. Frozen desserts and milk consistency not allowed by your dietitian. Sweets and desserts Coarse, hard, chewy, or sticky desserts. Any dessert with nuts, seeds, coconut, pineapple, or dried fruit. Bread pudding. Seasoning and other foods Soups and casseroles with large chunks. Sandwiches. Pizza. Summary  Moist and minced foods can be helpful for people with moderate swallowing problems.  On the dysphagia eating plan, you may eat foods that are soft, moist, and cut into pieces smaller than 47mm by 29mm.  You may be instructed to thicken liquids. Follow your health care provider's instructions about how to do this and to what consistency. This information is not intended to replace advice given to you by your health care provider. Make sure you discuss any questions you have with your health care provider. Document Released: 07/11/2005 Document Revised: 10/21/2016 Document Reviewed: 10/21/2016 Elsevier Interactive Patient Education  2019 [employer].

## 2018-12-03 NOTE — Progress Notes (Signed)
TELEHEALTH ENCOUNTER IN SETTING OF COVID-19 PANDEMIC - REQUESTED BY PATIENT SERVICE PROVIDED BY TELEMEDECINE - TYPE: telephone, AV not posible PATIENT LOCATION: Home PATIENT HAS CONSENTED TO TELEHEALTH VISIT PROVIDER LOCATION: OFFICE REFERRING PROVIDER: Delice Bison, DO  PARTICIPANTS OTHER THAN PATIENT:Daughter [facility] TIME SPENT ON CALL:15 mins    Carolyn [name] 85 y.o. 1930/09/12 [id number]  Assessment & Plan:  Esophageal dysphagia I think this may be improving now that she is living with her daughter.  We will try to more aggressively treat her reflux.  There does not seem to be a role for esophageal dilation at this point this is multifactorial with her paraesophageal hiatal hernia and reflux.  Bite sized versus minced and moist diet.  Instructions provided.  Gastroesophageal reflux disease with esophagitis Continue with twice daily omeprazole 40 mg.  Move the evening dose to before supper not at bedtime. Obtain a wedge to keep body elevated more when she is in the bed.  She is sleeping on 2 pillows but I think a wedge will help. Add Carafate 1 g with meals and at bedtime Add famotidine 40 mg at bedtime Follow-up if this fails to keep things stable or improve things.  We have limited options here beyond this I think.   Paraesophageal hiatal hernia Not a candidate for repair.  This was noted on 2016 upper GI.  When she has had EGDs is not been that apparent.   CC: Modena Nunnery D, DO    Subjective:   Chief Complaint: Follow-up of reflux and dysphagia  HPI The patient is an 85 year old demented woman who recently moved in with her daughter because of difficulty living at home alone, who has severe GERD with a history of Barrett's esophagus, a paraesophageal hernia and dysphagia.  EGD in January demonstrated severe inflammatory distal esophagitis.  Since that time she is perhaps a bit better, especially since moving in with her daughter who can provide care  as well as a caregiver helping.  The patient is taking 40 mg of omeprazole over-the-counter twice daily before breakfast and supper.  That is a cheaper regimen for them than prescription.  She still suffers with some dysphagia but her daughter Katharine Look who provides the history today is trying to cut her foods very small and break them down and mash them etc.  That seems to have helped things.  Weight seems relatively stable overall though she does have some edema.  She sleeps on 2 pillows and her daughter is trying to get her on a more structured regimen to where she is not sleeping during the day and only sleeping at night.  Head of bed is not otherwise raised. Allergies  Allergen Reactions  . Demerol [Meperidine Hcl] Other (See Comments)    Made crazy  . Penicillins Hives   Current Meds  Medication Sig  . Calcium Carbonate-Vitamin D (CALTRATE 600+D) 600-400 MG-UNIT per tablet Take 1 tablet by mouth daily.  . celecoxib (CELEBREX) 200 MG capsule Take 1 capsule (200 mg total) by mouth 2 (two) times daily. Stop taking meloxicam before starting this medication.  . Cholecalciferol (VITAMIN D3) 400 units tablet Take 2 tablets (800 Units total) daily by mouth.  . diclofenac sodium (VOLTAREN) 1 % GEL Apply 4 g topically 4 (four) times daily.  Marland Kitchen gabapentin (NEURONTIN) 300 MG capsule Take 2 capsules (600 mg total) by mouth at bedtime.  . irbesartan (AVAPRO) 150 MG tablet Take 1 tablet (150 mg total) by mouth daily.  Marland Kitchen levothyroxine (SYNTHROID,  LEVOTHROID) 100 MCG tablet Take 1 tablet (100 mcg total) by mouth daily before breakfast.  . metoprolol succinate (TOPROL-XL) 25 MG 24 hr tablet TAKE 1 TABLET BY MOUTH  DAILY  . mirtazapine (REMERON) 15 MG tablet TAKE 2 TABLETS(30 MG) BY MOUTH AT BEDTIME. STOP TAKING CYMBALTA BEFORE STARTING THIS MEDICATION  . Omega-3 Fatty Acids (FISH OIL) 1000 MG CAPS Take 100 mg by mouth daily.  Marland Kitchen omeprazole (PRILOSEC) 20 MG capsule Take 40 mg by mouth 2 (two) times daily.   .  ondansetron (ZOFRAN) 4 MG tablet Take by mouth.  . oxybutynin (DITROPAN-XL) 5 MG 24 hr tablet TAKE 1 TABLET BY MOUTH  EVERY NIGHT AT BEDTIME  . [DISCONTINUED] sucralfate (CARAFATE) 1 GM/10ML suspension Take 10 mLs (1 g total) by mouth 4 (four) times daily.   Past Medical History:  Diagnosis Date  . Arthritis 11/2013.    "both knees" (11/26/2013)  . Asthma    "only when I smoked"  . Barrett's esophagus since at least 1985  . CKD (chronic kidney disease)    GFR 55 11/2014   . COPD (chronic obstructive pulmonary disease) ([place])   . Depression with anxiety 11/2013   "little bit"   . Diverticulosis of colon 1996  . Failure to thrive in adult 10/24/2016  . Gallstones   . GAVE (gastric antral vascular ectasia) 01/16/2015  . GERD (gastroesophageal reflux disease)   . Hiatal hernia    s/p gastropexy after failed Nissen fundoplication.   . High cholesterol   . Hx of adenomatous colonic polyps   . Hypertension   . Hypothyroidism   . Pneumonia 11/2013  . Status post dilation of esophageal narrowing    Past Surgical History:  Procedure Laterality Date  . ABDOMINAL HYSTERECTOMY    . ANTERIOR APPROACH HEMI HIP ARTHROPLASTY Right 01/15/2016   Procedure: ANTERIOR APPROACH HEMI HIP ARTHROPLASTY;  Surgeon: Mcarthur Rossetti, MD;  Location: [place];  Service: Orthopedics;  Laterality: Right;  . APPENDECTOMY    . BLADDER SUSPENSION     .  bladder tack x 3.   . CATARACT EXTRACTION W/ INTRAOCULAR LENS  IMPLANT, BILATERAL Bilateral   . CHOLECYSTECTOMY    . COLONOSCOPY N/A 01/16/2015   Procedure: COLONOSCOPY;  Surgeon: Gatha Mayer, MD;  Location: [place];  Service: Endoscopy;  Laterality: N/A;  . ENTEROCELE REPAIR  2003   with colopexy.   . ESOPHAGOGASTRODUODENOSCOPY N/A 09/01/2014   Procedure: ESOPHAGOGASTRODUODENOSCOPY (EGD);  Surgeon: Lafayette Dragon, MD;  Location: [facility];  Service: Endoscopy;  Laterality: N/A;  . ESOPHAGOGASTRODUODENOSCOPY N/A 01/16/2015   Procedure:  ESOPHAGOGASTRODUODENOSCOPY (EGD);  Surgeon: Gatha Mayer, MD;  Location[facility];  Service: Endoscopy;  Laterality: N/A;  . gastropexy  1989  . HIP ARTHROPLASTY Left 02/07/2014   Procedure: LEFT HIP PRESS FIT MONOPOLAR HEMIARTHROPLASTY ;  Surgeon: Marybelle Killings, MD;  Location: [place];  Service: Orthopedics;  Laterality: Left;  . NISSEN FUNDOPLICATION     this ultimately failed and she underwent gastropexy   Social History   Social History Narrative   Lives at home by herself with close support   Used to be a weaver but retired in 1979   family history includes Bladder Cancer in her sister; Heart disease in her brother, brother, father, and mother; Stomach cancer in her mother.   Review of Systems As per HPI  15 minutes time spent with patient > half in counseling coordination of care

## 2018-12-03 NOTE — Assessment & Plan Note (Addendum)
Continue with twice daily omeprazole 40 mg.  Move the evening dose to before supper not at bedtime. Obtain a wedge to keep body elevated more when she is in the bed.  She is sleeping on 2 pillows but I think a wedge will help. Add Carafate 1 g with meals and at bedtime Add famotidine 40 mg at bedtime Follow-up if this fails to keep things stable or improve things.  We have limited options here beyond this I think.

## 2018-12-04 ENCOUNTER — Encounter: Payer: Self-pay | Admitting: Internal Medicine

## 2018-12-04 NOTE — Assessment & Plan Note (Signed)
Patient is now living with her daughter, Carolyn [name]. Transition went very well, and she is now able to have constant care. No recent falls.

## 2018-12-04 NOTE — Assessment & Plan Note (Signed)
Patient still complains of intermittent pain in both wrists, but daughter notes swelling has improved. No fevers, chills, erythema, increased warmth at any joint. Will continue Celebrex and Voltaren gel, as well as elevation and ice as tolerated.

## 2018-12-04 NOTE — Assessment & Plan Note (Signed)
Followed by Dr. Carlean Purl. Had telehealth visit with him today. Adjustments made to PPI schedule, added Famotidine qhs and carafate with meals and qhs, recommended wedge to sleep under at night. Greatly appreciate his recommendations.

## 2018-12-05 NOTE — Progress Notes (Signed)
Internal Medicine Clinic Attending  Case discussed with Dr. Bloomfield at the time of the visit.  We reviewed the resident's history and exam and pertinent patient test results.  I agree with the assessment, diagnosis, and plan of care documented in the resident's note.  

## 2018-12-10 ENCOUNTER — Other Ambulatory Visit: Payer: Self-pay | Admitting: *Deleted

## 2018-12-10 DIAGNOSIS — N390 Urinary tract infection, site not specified: Secondary | ICD-10-CM

## 2018-12-10 MED ORDER — OXYBUTYNIN CHLORIDE ER 5 MG PO TB24: 5.0000 mg | ORAL_TABLET | Freq: Every day | ORAL | 2 refills | Status: DC

## 2018-12-12 ENCOUNTER — Other Ambulatory Visit: Payer: Self-pay | Admitting: *Deleted

## 2018-12-12 DIAGNOSIS — N390 Urinary tract infection, site not specified: Secondary | ICD-10-CM

## 2018-12-12 NOTE — Telephone Encounter (Signed)
Talked to pt's daughter, Katharine Look - stated pt uses [facility] now since pt lives with her now. [facility] removed from pt's chart per daughter's request.

## 2018-12-18 MED ORDER — OXYBUTYNIN CHLORIDE ER 5 MG PO TB24: 5.0000 mg | ORAL_TABLET | Freq: Every day | ORAL | 2 refills | Status: AC

## 2019-01-07 ENCOUNTER — Other Ambulatory Visit: Payer: Self-pay

## 2019-01-07 ENCOUNTER — Ambulatory Visit (INDEPENDENT_AMBULATORY_CARE_PROVIDER_SITE_OTHER): Payer: Medicare Other | Admitting: Surgery

## 2019-01-07 ENCOUNTER — Telehealth: Payer: Self-pay | Admitting: Radiology

## 2019-01-07 ENCOUNTER — Encounter: Payer: Self-pay | Admitting: Surgery

## 2019-01-07 ENCOUNTER — Ambulatory Visit: Payer: Self-pay

## 2019-01-07 VITALS — BP 136/77 | HR 82

## 2019-01-07 DIAGNOSIS — G8929 Other chronic pain: Secondary | ICD-10-CM

## 2019-01-07 DIAGNOSIS — M1712 Unilateral primary osteoarthritis, left knee: Secondary | ICD-10-CM

## 2019-01-07 DIAGNOSIS — M25562 Pain in left knee: Secondary | ICD-10-CM

## 2019-01-07 DIAGNOSIS — M25561 Pain in right knee: Secondary | ICD-10-CM

## 2019-01-07 DIAGNOSIS — M1711 Unilateral primary osteoarthritis, right knee: Secondary | ICD-10-CM

## 2019-01-07 NOTE — Telephone Encounter (Signed)
Bilateral knee monovisc  Patient is returning in 3 months.  Thanks!

## 2019-01-07 NOTE — Progress Notes (Signed)
Office Visit Note   Patient: Carolyn [name]           Date of Birth: 03/22/1931           MRN: [id number] Visit Date: 01/07/2019              Requested by: Modena Nunnery D, DO [address],  [place] [address] PCP: Delice Bison, DO   Assessment & Plan: Visit Diagnoses:  1. Chronic pain of both knees     Plan: Follow-up as needed  Follow-Up Instructions: No follow-ups on file.   Orders:  Orders Placed This Encounter  Procedures   XR Knee 1-2 Views Left   XR Knee 1-2 Views Right   No orders of the defined types were placed in this encounter.     Procedures: Large Joint Inj: bilateral knee on 01/07/2019 11:05 AM Details: 25 G 1.5 in needle Medications (Right): 3 mL lidocaine 1 %; 6 mL bupivacaine 0.25 %; 40 mg methylPREDNISolone acetate 40 MG/ML Medications (Left): 3 mL lidocaine 1 %; 6 mL bupivacaine 0.25 %; 40 mg methylPREDNISolone acetate 40 MG/ML Outcome: tolerated well, no immediate complications Consent was given by the patient. Patient was prepped and draped in the usual sterile fashion.       Clinical Data: No additional findings.   Subjective: Chief Complaint  Patient presents with   Left Knee - Pain, Follow-up   Right Knee - Pain    HPI  Review of Systems   Objective: Vital Signs: BP 136/77 (BP Location: Left Arm, Patient Position: Sitting, Cuff Size: Normal)   Pulse 82   Physical Exam  Ortho Exam  Specialty Comments:  No specialty comments available.  Imaging: No results found.   PMFS History: Patient Active Problem List   Diagnosis Date Noted   Gastroesophageal reflux disease with esophagitis 12/03/2018   Esophageal dysphagia 12/03/2018   Osteoarthritis involving multiple joints on both sides of body 11/19/2018   Dysuria 08/29/2018   Depression 08/29/2018   Paraesophageal hiatal hernia 08/06/2018   Odynophagia 07/02/2018   Decreased appetite 07/02/2018   Chronic low back pain 02/21/2018   Dementia  ([place]) 02/20/2018   Weight loss 06/06/2017   Need for immunization against influenza 06/06/2017   Osteoporosis 04/17/2017   Healthcare maintenance 02/23/2016   OAB (overactive bladder) 02/02/2016   Hyperlipidemia 12/08/2015   Nonrheumatic aortic valve insufficiency 10/19/2015   PAC (premature atrial contraction) 09/09/2015   History of DVT (deep vein thrombosis) 10/02/2014   Barrett's esophagus    Fracture of femoral neck, right ([place]) 02/07/2014   Hypothyroidism 02/07/2014   Essential hypertension 02/07/2014   Past Medical History:  Diagnosis Date   Arthritis 11/2013.    "both knees" (11/26/2013)   Asthma    "only when I smoked"   Barrett's esophagus since at least 1985   CKD (chronic kidney disease)    GFR 55 11/2014    COPD (chronic obstructive pulmonary disease) ([place])    Depression with anxiety 11/2013   "little bit"    Diverticulosis of colon 1996   Failure to thrive in adult 10/24/2016   Gallstones    GAVE (gastric antral vascular ectasia) 01/16/2015   GERD (gastroesophageal reflux disease)    Hiatal hernia    s/p gastropexy after failed Nissen fundoplication.    High cholesterol    Hx of adenomatous colonic polyps    Hypertension    Hypothyroidism    Pneumonia 11/2013   Status post dilation of esophageal narrowing  Family History  Problem Relation Age of Onset   Heart disease Mother    Stomach cancer Mother    Heart disease Father    Heart disease Brother    Bladder Cancer Sister    Heart disease Brother     Past Surgical History:  Procedure Laterality Date   ABDOMINAL HYSTERECTOMY     ANTERIOR APPROACH HEMI HIP ARTHROPLASTY Right 01/15/2016   Procedure: ANTERIOR APPROACH HEMI HIP ARTHROPLASTY;  Surgeon: Mcarthur Rossetti, MD;  Location: [facility];  Service: Orthopedics;  Laterality: Right;   APPENDECTOMY     BLADDER SUSPENSION     .  bladder tack x 3.    CATARACT EXTRACTION W/ INTRAOCULAR LENS  IMPLANT, BILATERAL Bilateral    CHOLECYSTECTOMY     COLONOSCOPY  N/A 01/16/2015   Procedure: COLONOSCOPY;  Surgeon: Gatha Mayer, MD;  Location: [facility];  Service: Endoscopy;  Laterality: N/A;   ENTEROCELE REPAIR  2003   with colopexy.    ESOPHAGOGASTRODUODENOSCOPY N/A 09/01/2014   Procedure: ESOPHAGOGASTRODUODENOSCOPY (EGD);  Surgeon: Lafayette Dragon, MD;  Location: [facility];  Service: Endoscopy;  Laterality: N/A;   ESOPHAGOGASTRODUODENOSCOPY N/A 01/16/2015   Procedure: ESOPHAGOGASTRODUODENOSCOPY (EGD);  Surgeon: Gatha Mayer, MD;  Location: [facility];  Service: Endoscopy;  Laterality: N/A;   gastropexy  1989   HIP ARTHROPLASTY Left 02/07/2014   Procedure: LEFT HIP PRESS FIT MONOPOLAR HEMIARTHROPLASTY ;  Surgeon: Marybelle Killings, MD;  Location: [place] [facility];  Service: Orthopedics;  Laterality: Left;   NISSEN FUNDOPLICATION     this ultimately failed and she underwent gastropexy   Social History   Occupational History   Occupation: Retired    Fish farm manager: [employer]  Tobacco Use   Smoking status: Former Smoker    Packs/day: 0.25    Years: 40.00    Pack years: 10.00    Types: Cigarettes   Smokeless tobacco: Never Used   Tobacco comment: "quit smoking ~ 2005" but was on/off during that time  Substance and Sexual Activity   Alcohol use: No    Alcohol/week: 0.0 standard drinks    Comment: 11/26/2013 "drank a little alcohol; last drink was a long time ago"   Drug use: No   Sexual activity: Never

## 2019-01-10 ENCOUNTER — Ambulatory Visit: Payer: Medicare Other | Admitting: Surgery

## 2019-01-10 NOTE — Telephone Encounter (Signed)
Noted  

## 2019-01-23 DIAGNOSIS — N390 Urinary tract infection, site not specified: Secondary | ICD-10-CM | POA: Diagnosis not present

## 2019-02-04 ENCOUNTER — Telehealth: Payer: Self-pay | Admitting: *Deleted

## 2019-02-04 MED ORDER — ASPIRIN EC 81 MG PO TBEC: 81.0000 mg | DELAYED_RELEASE_TABLET | Freq: Every day | ORAL | 0 refills | Status: AC

## 2019-02-04 NOTE — Telephone Encounter (Signed)
I would say if she's feeling well and back at her baseline no need for labs at this time. Looking back at her chart it looks like aspirin was discontinued to treat an acute DVT. I think it's reasonable to have her back on daily aspirin.   Thanks!

## 2019-02-04 NOTE — Telephone Encounter (Signed)
Received call from Tory Emerald, RN with [facility] reporting TIA-like symptoms that patient experienced on Friday, 02/01/2019. Granddaughter and sitter were with patient when she had 5 minute episode of right arm "flimsy, gurgling speech and couldn't see." All sx resolved after 5 minutes, but patient remained weak and agitated that day. Patient refused to go to ED and refused RN visit from [facility] that day. Next day patient had hair done and went to birthday party without any residual effects. Patient had UTI on 01/23/2019 and was treated with Cipro which she has completed. Olivia Mackie will see patient today between 2-3 PM and wants to know if PCP would like any labs drawn. Also, wonders if patient should be on daily aspirin. Will route to PCP and Attending Pool. Hubbard Hartshorn, RN, BSN

## 2019-02-04 NOTE — Telephone Encounter (Signed)
Returned call to [place], no answer. Left detailed message on self-identified VM that no labs are needed but yes to daily aspirin. Requested return call to let us know she received instructions. Hubbard Hartshorn, RN, BSN

## 2019-02-04 NOTE — Telephone Encounter (Signed)
Olivia Mackie returned call and confirmed order to restart daily aspirin 81 mg EC. Hubbard Hartshorn, RN, BSN

## 2019-02-04 NOTE — Telephone Encounter (Signed)
Will defer to PCP. Dr. Koleen Distance will address this

## 2019-02-06 ENCOUNTER — Telehealth: Payer: Self-pay | Admitting: *Deleted

## 2019-02-06 NOTE — Telephone Encounter (Signed)
Carolyn [name] with [facility] called to report patient had fall yesterday. Daughter reported to [place] that patient fell back against the bed. Has few scrapes on back but no other injuries. Daughter is requesting a transfer chair and wonders if patient should have CT scan. Appt made with PCP for telehealth visit on 02/11/2019 to discuss this. Patient currently living with daughter in [facility] and it's difficult to bring patient in. Carolyn Hartshorn, RN, BSN

## 2019-02-11 ENCOUNTER — Telehealth: Payer: Self-pay | Admitting: *Deleted

## 2019-02-11 ENCOUNTER — Other Ambulatory Visit: Payer: Self-pay

## 2019-02-11 ENCOUNTER — Ambulatory Visit: Payer: Medicare Other | Admitting: Internal Medicine

## 2019-02-11 DIAGNOSIS — I10 Essential (primary) hypertension: Secondary | ICD-10-CM | POA: Diagnosis not present

## 2019-02-11 DIAGNOSIS — E875 Hyperkalemia: Secondary | ICD-10-CM | POA: Diagnosis not present

## 2019-02-11 DIAGNOSIS — N179 Acute kidney failure, unspecified: Secondary | ICD-10-CM | POA: Diagnosis not present

## 2019-02-11 DIAGNOSIS — Z7189 Other specified counseling: Secondary | ICD-10-CM | POA: Diagnosis not present

## 2019-02-11 DIAGNOSIS — E860 Dehydration: Secondary | ICD-10-CM | POA: Diagnosis not present

## 2019-02-11 DIAGNOSIS — Z743 Need for continuous supervision: Secondary | ICD-10-CM | POA: Diagnosis not present

## 2019-02-11 DIAGNOSIS — M545 Low back pain: Secondary | ICD-10-CM | POA: Diagnosis not present

## 2019-02-11 DIAGNOSIS — R918 Other nonspecific abnormal finding of lung field: Secondary | ICD-10-CM | POA: Diagnosis not present

## 2019-02-11 DIAGNOSIS — D649 Anemia, unspecified: Secondary | ICD-10-CM | POA: Diagnosis not present

## 2019-02-11 DIAGNOSIS — Z87891 Personal history of nicotine dependence: Secondary | ICD-10-CM | POA: Diagnosis not present

## 2019-02-11 DIAGNOSIS — R279 Unspecified lack of coordination: Secondary | ICD-10-CM | POA: Diagnosis not present

## 2019-02-11 DIAGNOSIS — R627 Adult failure to thrive: Secondary | ICD-10-CM | POA: Diagnosis not present

## 2019-02-11 DIAGNOSIS — Z515 Encounter for palliative care: Secondary | ICD-10-CM | POA: Diagnosis not present

## 2019-02-11 DIAGNOSIS — Z8673 Personal history of transient ischemic attack (TIA), and cerebral infarction without residual deficits: Secondary | ICD-10-CM | POA: Diagnosis not present

## 2019-02-11 DIAGNOSIS — Z888 Allergy status to other drugs, medicaments and biological substances status: Secondary | ICD-10-CM | POA: Diagnosis not present

## 2019-02-11 DIAGNOSIS — D638 Anemia in other chronic diseases classified elsewhere: Secondary | ICD-10-CM | POA: Diagnosis not present

## 2019-02-11 DIAGNOSIS — Z66 Do not resuscitate: Secondary | ICD-10-CM | POA: Diagnosis not present

## 2019-02-11 DIAGNOSIS — J159 Unspecified bacterial pneumonia: Secondary | ICD-10-CM | POA: Diagnosis not present

## 2019-02-11 DIAGNOSIS — F0390 Unspecified dementia without behavioral disturbance: Secondary | ICD-10-CM

## 2019-02-11 DIAGNOSIS — K219 Gastro-esophageal reflux disease without esophagitis: Secondary | ICD-10-CM | POA: Diagnosis not present

## 2019-02-11 DIAGNOSIS — Z880 Allergy status to penicillin: Secondary | ICD-10-CM | POA: Diagnosis not present

## 2019-02-11 DIAGNOSIS — Z79899 Other long term (current) drug therapy: Secondary | ICD-10-CM | POA: Diagnosis not present

## 2019-02-11 DIAGNOSIS — E039 Hypothyroidism, unspecified: Secondary | ICD-10-CM | POA: Diagnosis not present

## 2019-02-11 DIAGNOSIS — Z7982 Long term (current) use of aspirin: Secondary | ICD-10-CM | POA: Diagnosis not present

## 2019-02-11 DIAGNOSIS — J168 Pneumonia due to other specified infectious organisms: Secondary | ICD-10-CM | POA: Diagnosis not present

## 2019-02-11 DIAGNOSIS — R079 Chest pain, unspecified: Secondary | ICD-10-CM | POA: Diagnosis not present

## 2019-02-11 DIAGNOSIS — J189 Pneumonia, unspecified organism: Secondary | ICD-10-CM | POA: Diagnosis not present

## 2019-02-11 DIAGNOSIS — R404 Transient alteration of awareness: Secondary | ICD-10-CM | POA: Diagnosis not present

## 2019-02-11 DIAGNOSIS — G8929 Other chronic pain: Secondary | ICD-10-CM | POA: Diagnosis not present

## 2019-02-11 NOTE — Progress Notes (Signed)
Patient was taken to ED in [place] today for evaluation of intermittent weakness. Spoke with daughter, Katharine Look, and they are still waiting on full work-up. She and palliative care nurse will follow-up with [place] once she is discharged regarding any additional home health needs.

## 2019-02-11 NOTE — Telephone Encounter (Signed)
[facility] RN calls to say that pt's daughter/ caregiver is having pt transported to [facility] facility for eval. Pt is weaker, having "episodes" like TIA. Pt is very confused.

## 2019-02-22 ENCOUNTER — Telehealth: Payer: Self-pay

## 2019-02-22 NOTE — Telephone Encounter (Signed)
Submitted VOB for SynviscOne, bilateral knee. 

## 2019-02-27 ENCOUNTER — Telehealth: Payer: Self-pay

## 2019-02-27 NOTE — Telephone Encounter (Signed)
Approved for SynviscOne, bilateral knee. [place] Patient will be responsible for 20% OOP. Co-pay of $40.00 required No PA  Appt. 04/04/2019 with Dr. Louanne Skye

## 2019-03-11 ENCOUNTER — Ambulatory Visit (INDEPENDENT_AMBULATORY_CARE_PROVIDER_SITE_OTHER): Admitting: Internal Medicine

## 2019-03-11 ENCOUNTER — Other Ambulatory Visit: Payer: Self-pay

## 2019-03-11 ENCOUNTER — Encounter: Payer: Self-pay | Admitting: Internal Medicine

## 2019-03-11 DIAGNOSIS — F0390 Unspecified dementia without behavioral disturbance: Secondary | ICD-10-CM | POA: Diagnosis not present

## 2019-03-11 NOTE — Assessment & Plan Note (Addendum)
Spoke with daughter, Carolyn [name], on patient's condition. She seems to be nearing the end of her life based on poor feeding, recent hospitalization for pneumonia, and is completely bed bound. She continues to have issues with sleep/wake cycles. She is now receiving hospice services. Daughter is satisfied with home equipment. Patient does not have any fevers, chills, or symptoms of infection. No pressure wounds.  I discussed with Carolyn [name] prognosis and that patient likely has weeks to months remaining.  Will advise discontinuing all medications that are not for symptom management.

## 2019-03-11 NOTE — Progress Notes (Signed)
  [facility] Internal Medicine Residency Telephone Encounter Continuity Care Appointment  HPI:   This telephone encounter was created for [name] on 03/11/2019 for the following purpose/cc Dementia.   Past Medical History:  Past Medical History:  Diagnosis Date  . Arthritis 11/2013.    "both knees" (11/26/2013)  . Asthma    "only when I smoked"  . Barrett's esophagus since at least 1985  . CKD (chronic kidney disease)    GFR 55 11/2014   . COPD (chronic obstructive pulmonary disease) ([place])   . Depression with anxiety 11/2013   "little bit"   . Diverticulosis of colon 1996  . Failure to thrive in adult 10/24/2016  . Gallstones   . GAVE (gastric antral vascular ectasia) 01/16/2015  . GERD (gastroesophageal reflux disease)   . Hiatal hernia    s/p gastropexy after failed Nissen fundoplication.   . High cholesterol   . Hx of adenomatous colonic polyps   . Hypertension   . Hypothyroidism   . Pneumonia 11/2013  . Status post dilation of esophageal narrowing       ROS:   No fevers, chills, pain, pressure wounds   Assessment / Plan / Recommendations:   Please see A&P under problem oriented charting for assessment of the patient's acute and chronic medical conditions.   As always, pt is advised that if symptoms worsen or new symptoms arise, they should go to an urgent care facility or to to ER for further evaluation.   Consent and Medical Decision Making:   Patient discussed with Dr. Dareen Piano  This is a telephone encounter between Carolyn [name] and Delice Bison on 03/11/2019 for dementia. The visit was conducted with the patient located at home and Delice Bison at [facility]. The patient's identity was confirmed using their DOB and current address. The his/her legal guardian has consented to being evaluated through a telephone encounter and understands the associated risks (an examination cannot be done and the patient may need to come in for an appointment) /  benefits (allows the patient to remain at home, decreasing exposure to coronavirus). I personally spent 20 minutes on medical discussion.

## 2019-03-14 NOTE — Progress Notes (Signed)
Internal Medicine Clinic Attending  Case discussed with Dr. Bloomfield at the time of the visit.  We reviewed the resident's history and exam and pertinent patient test results.  I agree with the assessment, diagnosis, and plan of care documented in the resident's note.  

## 2019-03-26 ENCOUNTER — Other Ambulatory Visit: Payer: Self-pay | Admitting: Internal Medicine

## 2019-03-26 DIAGNOSIS — F329 Major depressive disorder, single episode, unspecified: Secondary | ICD-10-CM

## 2019-03-26 DIAGNOSIS — R630 Anorexia: Secondary | ICD-10-CM

## 2019-03-26 DIAGNOSIS — F32A Depression, unspecified: Secondary | ICD-10-CM

## 2019-03-26 DIAGNOSIS — R634 Abnormal weight loss: Secondary | ICD-10-CM

## 2019-03-26 MED ORDER — IRBESARTAN 150 MG PO TABS: 150.0000 mg | ORAL_TABLET | Freq: Every day | ORAL | 2 refills | Status: AC

## 2019-03-26 DEATH — deceased

## 2019-04-04 ENCOUNTER — Ambulatory Visit: Payer: Medicare Other | Admitting: Surgery

## 2019-04-25 ENCOUNTER — Encounter: Payer: Self-pay | Admitting: *Deleted

## 2022-04-17 MED ORDER — METHYLPREDNISOLONE ACETATE 40 MG/ML IJ SUSP
40.0000 mg | INTRAMUSCULAR | Status: AC | PRN
  Administered 2019-01-07: 40 mg via INTRA_ARTICULAR

## 2022-04-17 MED ORDER — BUPIVACAINE HCL 0.25 % IJ SOLN
6.0000 mL | INTRAMUSCULAR | Status: AC | PRN
  Administered 2019-01-07: 6 mL via INTRA_ARTICULAR

## 2022-04-17 MED ORDER — LIDOCAINE HCL 1 % IJ SOLN
3.0000 mL | INTRAMUSCULAR | Status: AC | PRN
  Administered 2019-01-07: 3 mL
